# Patient Record
Sex: Female | Born: 1945 | Race: Black or African American | Hispanic: No | Marital: Single | Smoking: Former smoker
Health system: Southern US, Community
[De-identification: ages and names within clinical notes are randomized; demographics above are authoritative.]

## PROBLEM LIST (undated history)

## (undated) DIAGNOSIS — G4733 Obstructive sleep apnea (adult) (pediatric): Secondary | ICD-10-CM

## (undated) DIAGNOSIS — F259 Schizoaffective disorder, unspecified: Secondary | ICD-10-CM

## (undated) DIAGNOSIS — E662 Morbid (severe) obesity with alveolar hypoventilation: Secondary | ICD-10-CM

## (undated) DIAGNOSIS — I10 Essential (primary) hypertension: Secondary | ICD-10-CM

## (undated) DIAGNOSIS — K7689 Other specified diseases of liver: Secondary | ICD-10-CM

## (undated) DIAGNOSIS — J449 Chronic obstructive pulmonary disease, unspecified: Secondary | ICD-10-CM

## (undated) DIAGNOSIS — K219 Gastro-esophageal reflux disease without esophagitis: Secondary | ICD-10-CM

## (undated) DIAGNOSIS — E785 Hyperlipidemia, unspecified: Secondary | ICD-10-CM

## (undated) DIAGNOSIS — D1771 Benign lipomatous neoplasm of kidney: Secondary | ICD-10-CM

## (undated) DIAGNOSIS — J9611 Chronic respiratory failure with hypoxia: Secondary | ICD-10-CM

---

## 2014-04-07 ENCOUNTER — Other Ambulatory Visit (HOSPITAL_COMMUNITY): Payer: Medicare Other

## 2014-04-07 ENCOUNTER — Inpatient Hospital Stay
Admission: EM | Admit: 2014-04-07 | Discharge: 2014-04-26 | Disposition: A | Discharge status: Home Health | Payer: Medicare Other | Source: Other Acute Inpatient Hospital | Attending: Internal Medicine | Admitting: Internal Medicine

## 2014-04-07 LAB — BLOOD GAS, ARTERIAL
ACID-BASE EXCESS: 11.4 mmol/L — AB (ref 0.0–2.0)
BICARBONATE: 37.1 meq/L — AB (ref 20.0–24.0)
Delivery systems: POSITIVE
Expiratory PAP: 6
FIO2: 0.4 %
INSPIRATORY PAP: 18
O2 SAT: 96.4 %
PATIENT TEMPERATURE: 98.6
PO2 ART: 84.2 mmHg (ref 80.0–100.0)
TCO2: 39.1 mmol/L (ref 0–100)
pCO2 arterial: 65.9 mmHg (ref 35.0–45.0)
pH, Arterial: 7.369 (ref 7.350–7.450)

## 2014-04-08 LAB — COMPREHENSIVE METABOLIC PANEL
ALT: 30 U/L (ref 0–35)
AST: 12 U/L (ref 0–37)
Albumin: 2.8 g/dL — ABNORMAL LOW (ref 3.5–5.2)
Alkaline Phosphatase: 52 U/L (ref 39–117)
BUN: 21 mg/dL (ref 6–23)
CALCIUM: 8.7 mg/dL (ref 8.4–10.5)
CO2: 32 mEq/L (ref 19–32)
CREATININE: 0.47 mg/dL — AB (ref 0.50–1.10)
Chloride: 99 mEq/L (ref 96–112)
GFR calc non Af Amer: >90 mL/min (ref >90)
GLUCOSE: 174 mg/dL — AB (ref 70–99)
Potassium: 4 mEq/L (ref 3.7–5.3)
Sodium: 141 mEq/L (ref 137–147)
TOTAL PROTEIN: 6.1 g/dL (ref 6.0–8.3)
Total Bilirubin: <0.2 mg/dL — ABNORMAL LOW (ref 0.3–1.2)

## 2014-04-08 LAB — BLOOD GAS, ARTERIAL
Acid-Base Excess: 10.9 mmol/L — ABNORMAL HIGH (ref 0.0–2.0)
Acid-Base Excess: 8.4 mmol/L — ABNORMAL HIGH (ref 0.0–2.0)
BICARBONATE: 36.3 meq/L — AB (ref 20.0–24.0)
BICARBONATE: 33.7 meq/L — AB (ref 20.0–24.0)
DRAWN BY: 252031
O2 CONTENT: 4 L/min
O2 CONTENT: 4 L/min
O2 SAT: 93.4 %
O2 Saturation: 92.2 %
PATIENT TEMPERATURE: 98.6
PCO2 ART: 62.8 mmHg — AB (ref 35.0–45.0)
PH ART: 7.381 (ref 7.350–7.450)
PH ART: 7.373 (ref 7.350–7.450)
PO2 ART: 67.6 mmHg — AB (ref 80.0–100.0)
Patient temperature: 98.6
TCO2: 38.3 mmol/L (ref 0–100)
TCO2: 35.5 mmol/L (ref 0–100)
pCO2 arterial: 59.4 mmHg (ref 35.0–45.0)
pO2, Arterial: 72 mmHg — ABNORMAL LOW (ref 80.0–100.0)

## 2014-04-08 LAB — CBC WITH DIFFERENTIAL/PLATELET
Basophils Absolute: 0 10*3/uL (ref 0.0–0.1)
Basophils Relative: 0 % (ref 0–1)
EOS ABS: 0 10*3/uL (ref 0.0–0.7)
EOS PCT: 0 % (ref 0–5)
HCT: 34.2 % — ABNORMAL LOW (ref 36.0–46.0)
HEMOGLOBIN: 10 g/dL — AB (ref 12.0–15.0)
Lymphocytes Relative: 8 % — ABNORMAL LOW (ref 12–46)
Lymphs Abs: 0.5 10*3/uL — ABNORMAL LOW (ref 0.7–4.0)
MCH: 23.6 pg — AB (ref 26.0–34.0)
MCHC: 29.2 g/dL — ABNORMAL LOW (ref 30.0–36.0)
MCV: 80.9 fL (ref 78.0–100.0)
MONOS PCT: 6 % (ref 3–12)
Monocytes Absolute: 0.4 10*3/uL (ref 0.1–1.0)
Neutro Abs: 5.5 10*3/uL (ref 1.7–7.7)
Neutrophils Relative %: 86 % — ABNORMAL HIGH (ref 43–77)
Platelets: 178 10*3/uL (ref 150–400)
RBC: 4.23 MIL/uL (ref 3.87–5.11)
RDW: 14.8 % (ref 11.5–15.5)
WBC: 6.3 10*3/uL (ref 4.0–10.5)

## 2014-04-08 LAB — PHOSPHORUS: PHOSPHORUS: 2.4 mg/dL (ref 2.3–4.6)

## 2014-04-08 LAB — TSH: TSH: 0.676 u[IU]/mL (ref 0.350–4.500)

## 2014-04-08 LAB — PRO B NATRIURETIC PEPTIDE: Pro B Natriuretic peptide (BNP): 35.9 pg/mL (ref 0–125)

## 2014-04-08 LAB — PROCALCITONIN: Procalcitonin: <0.1 ng/mL

## 2014-04-08 LAB — MAGNESIUM: MAGNESIUM: 2.1 mg/dL (ref 1.5–2.5)

## 2014-04-09 LAB — IRON AND TIBC
IRON: 60 ug/dL (ref 42–135)
Saturation Ratios: 20 % (ref 20–55)
TIBC: 296 ug/dL (ref 250–470)
UIBC: 236 ug/dL (ref 125–400)

## 2014-04-09 LAB — T4, FREE: Free T4: 1.34 ng/dL (ref 0.80–1.80)

## 2014-04-09 LAB — VITAMIN B12: Vitamin B-12: 643 pg/mL (ref 211–911)

## 2014-04-09 LAB — FOLATE RBC: RBC Folate: 965 ng/mL — ABNORMAL HIGH (ref >280)

## 2014-04-09 LAB — PREALBUMIN: PREALBUMIN: 26.3 mg/dL (ref 17.0–34.0)

## 2014-04-11 ENCOUNTER — Other Ambulatory Visit (HOSPITAL_COMMUNITY): Payer: Medicare Other

## 2014-04-11 LAB — BASIC METABOLIC PANEL
BUN: 20 mg/dL (ref 6–23)
CALCIUM: 8.8 mg/dL (ref 8.4–10.5)
CO2: 38 mEq/L — ABNORMAL HIGH (ref 19–32)
Chloride: 101 mEq/L (ref 96–112)
Creatinine, Ser: 0.46 mg/dL — ABNORMAL LOW (ref 0.50–1.10)
GFR calc Af Amer: >90 mL/min (ref >90)
Glucose, Bld: 148 mg/dL — ABNORMAL HIGH (ref 70–99)
Potassium: 4.8 mEq/L (ref 3.7–5.3)
Sodium: 145 mEq/L (ref 137–147)

## 2014-04-11 LAB — CBC
HCT: 37.7 % (ref 36.0–46.0)
Hemoglobin: 10.9 g/dL — ABNORMAL LOW (ref 12.0–15.0)
MCH: 24 pg — AB (ref 26.0–34.0)
MCHC: 28.9 g/dL — AB (ref 30.0–36.0)
MCV: 82.9 fL (ref 78.0–100.0)
PLATELETS: 187 10*3/uL (ref 150–400)
RBC: 4.55 MIL/uL (ref 3.87–5.11)
RDW: 15.1 % (ref 11.5–15.5)
WBC: 9.6 10*3/uL (ref 4.0–10.5)

## 2014-04-12 ENCOUNTER — Other Ambulatory Visit (HOSPITAL_COMMUNITY): Payer: Medicare Other

## 2014-04-13 ENCOUNTER — Other Ambulatory Visit (HOSPITAL_COMMUNITY): Payer: Medicare Other

## 2014-04-13 LAB — BASIC METABOLIC PANEL
BUN: 15 mg/dL (ref 6–23)
CALCIUM: 9.1 mg/dL (ref 8.4–10.5)
CO2: 39 mEq/L — ABNORMAL HIGH (ref 19–32)
CREATININE: 0.36 mg/dL — AB (ref 0.50–1.10)
Chloride: 95 mEq/L — ABNORMAL LOW (ref 96–112)
GFR calc Af Amer: >90 mL/min (ref >90)
GFR calc non Af Amer: >90 mL/min (ref >90)
Glucose, Bld: 195 mg/dL — ABNORMAL HIGH (ref 70–99)
Potassium: 4.7 mEq/L (ref 3.7–5.3)
SODIUM: 141 meq/L (ref 137–147)

## 2014-04-13 LAB — CBC
HCT: 36.7 % (ref 36.0–46.0)
Hemoglobin: 10.7 g/dL — ABNORMAL LOW (ref 12.0–15.0)
MCH: 24 pg — ABNORMAL LOW (ref 26.0–34.0)
MCHC: 29.2 g/dL — ABNORMAL LOW (ref 30.0–36.0)
MCV: 82.5 fL (ref 78.0–100.0)
PLATELETS: 167 10*3/uL (ref 150–400)
RBC: 4.45 MIL/uL (ref 3.87–5.11)
RDW: 15.2 % (ref 11.5–15.5)
WBC: 8.4 10*3/uL (ref 4.0–10.5)

## 2014-04-15 ENCOUNTER — Other Ambulatory Visit (HOSPITAL_COMMUNITY): Payer: Medicare Other

## 2014-04-15 LAB — CBC WITH DIFFERENTIAL/PLATELET
BASOS ABS: 0 10*3/uL (ref 0.0–0.1)
Basophils Relative: 0 % (ref 0–1)
Eosinophils Absolute: 0 10*3/uL (ref 0.0–0.7)
Eosinophils Relative: 0 % (ref 0–5)
HCT: 34.4 % — ABNORMAL LOW (ref 36.0–46.0)
Hemoglobin: 9.9 g/dL — ABNORMAL LOW (ref 12.0–15.0)
LYMPHS ABS: 0.4 10*3/uL — AB (ref 0.7–4.0)
LYMPHS PCT: 6 % — AB (ref 12–46)
MCH: 23.9 pg — ABNORMAL LOW (ref 26.0–34.0)
MCHC: 28.8 g/dL — ABNORMAL LOW (ref 30.0–36.0)
MCV: 83.1 fL (ref 78.0–100.0)
Monocytes Absolute: 0.4 10*3/uL (ref 0.1–1.0)
Monocytes Relative: 5 % (ref 3–12)
NEUTROS ABS: 6.3 10*3/uL (ref 1.7–7.7)
NEUTROS PCT: 89 % — AB (ref 43–77)
PLATELETS: 183 10*3/uL (ref 150–400)
RBC: 4.14 MIL/uL (ref 3.87–5.11)
RDW: 15.5 % (ref 11.5–15.5)
WBC: 7.1 10*3/uL (ref 4.0–10.5)

## 2014-04-15 LAB — BASIC METABOLIC PANEL
BUN: 17 mg/dL (ref 6–23)
CHLORIDE: 96 meq/L (ref 96–112)
CO2: 41 meq/L — AB (ref 19–32)
Calcium: 9 mg/dL (ref 8.4–10.5)
Creatinine, Ser: 0.4 mg/dL — ABNORMAL LOW (ref 0.50–1.10)
GFR calc Af Amer: >90 mL/min (ref >90)
GFR calc non Af Amer: >90 mL/min (ref >90)
GLUCOSE: 208 mg/dL — AB (ref 70–99)
POTASSIUM: 4.9 meq/L (ref 3.7–5.3)
SODIUM: 144 meq/L (ref 137–147)

## 2014-04-16 LAB — CBC WITH DIFFERENTIAL/PLATELET
Basophils Absolute: 0 10*3/uL (ref 0.0–0.1)
Basophils Relative: 0 % (ref 0–1)
EOS PCT: 1 % (ref 0–5)
Eosinophils Absolute: 0.1 10*3/uL (ref 0.0–0.7)
HCT: 34.5 % — ABNORMAL LOW (ref 36.0–46.0)
HEMOGLOBIN: 9.9 g/dL — AB (ref 12.0–15.0)
LYMPHS ABS: 1.2 10*3/uL (ref 0.7–4.0)
LYMPHS PCT: 20 % (ref 12–46)
MCH: 23.9 pg — AB (ref 26.0–34.0)
MCHC: 28.7 g/dL — ABNORMAL LOW (ref 30.0–36.0)
MCV: 83.3 fL (ref 78.0–100.0)
Monocytes Absolute: 0.6 10*3/uL (ref 0.1–1.0)
Monocytes Relative: 11 % (ref 3–12)
Neutro Abs: 4 10*3/uL (ref 1.7–7.7)
Neutrophils Relative %: 68 % (ref 43–77)
PLATELETS: 133 10*3/uL — AB (ref 150–400)
RBC: 4.14 MIL/uL (ref 3.87–5.11)
RDW: 15.7 % — ABNORMAL HIGH (ref 11.5–15.5)
WBC: 5.9 10*3/uL (ref 4.0–10.5)

## 2014-04-16 LAB — MAGNESIUM: Magnesium: 2 mg/dL (ref 1.5–2.5)

## 2014-04-16 LAB — BASIC METABOLIC PANEL
BUN: 14 mg/dL (ref 6–23)
CO2: 42 meq/L — AB (ref 19–32)
Calcium: 9.1 mg/dL (ref 8.4–10.5)
Chloride: 96 mEq/L (ref 96–112)
Creatinine, Ser: 0.54 mg/dL (ref 0.50–1.10)
GFR calc Af Amer: >90 mL/min (ref >90)
GFR calc non Af Amer: >90 mL/min (ref >90)
Glucose, Bld: 137 mg/dL — ABNORMAL HIGH (ref 70–99)
POTASSIUM: 4.2 meq/L (ref 3.7–5.3)
SODIUM: 144 meq/L (ref 137–147)

## 2014-04-16 LAB — PREALBUMIN: PREALBUMIN: 26 mg/dL (ref 17.0–34.0)

## 2014-04-16 LAB — PHOSPHORUS: PHOSPHORUS: 4.1 mg/dL (ref 2.3–4.6)

## 2014-04-17 ENCOUNTER — Other Ambulatory Visit (HOSPITAL_COMMUNITY): Payer: Medicare Other

## 2014-04-17 LAB — CBC WITH DIFFERENTIAL/PLATELET
Basophils Absolute: 0 10*3/uL (ref 0.0–0.1)
Basophils Relative: 0 % (ref 0–1)
Eosinophils Absolute: 0.1 10*3/uL (ref 0.0–0.7)
Eosinophils Relative: 2 % (ref 0–5)
HCT: 35.2 % — ABNORMAL LOW (ref 36.0–46.0)
HEMOGLOBIN: 10 g/dL — AB (ref 12.0–15.0)
LYMPHS PCT: 20 % (ref 12–46)
Lymphs Abs: 1.2 10*3/uL (ref 0.7–4.0)
MCH: 23.6 pg — ABNORMAL LOW (ref 26.0–34.0)
MCHC: 28.4 g/dL — ABNORMAL LOW (ref 30.0–36.0)
MCV: 83 fL (ref 78.0–100.0)
MONOS PCT: 10 % (ref 3–12)
Monocytes Absolute: 0.6 10*3/uL (ref 0.1–1.0)
NEUTROS ABS: 4 10*3/uL (ref 1.7–7.7)
NEUTROS PCT: 68 % (ref 43–77)
Platelets: 159 10*3/uL (ref 150–400)
RBC: 4.24 MIL/uL (ref 3.87–5.11)
RDW: 15.8 % — ABNORMAL HIGH (ref 11.5–15.5)
WBC: 5.9 10*3/uL (ref 4.0–10.5)

## 2014-04-17 LAB — BASIC METABOLIC PANEL
BUN: 15 mg/dL (ref 6–23)
CHLORIDE: 96 meq/L (ref 96–112)
CO2: 41 meq/L — AB (ref 19–32)
Calcium: 9.5 mg/dL (ref 8.4–10.5)
Creatinine, Ser: 0.51 mg/dL (ref 0.50–1.10)
GFR calc Af Amer: >90 mL/min (ref >90)
GFR calc non Af Amer: >90 mL/min (ref >90)
GLUCOSE: 129 mg/dL — AB (ref 70–99)
POTASSIUM: 4.4 meq/L (ref 3.7–5.3)
Sodium: 145 mEq/L (ref 137–147)

## 2014-04-19 ENCOUNTER — Other Ambulatory Visit (HOSPITAL_COMMUNITY): Payer: Medicare Other

## 2014-04-19 LAB — CBC
HEMATOCRIT: 33.6 % — AB (ref 36.0–46.0)
HEMOGLOBIN: 9.5 g/dL — AB (ref 12.0–15.0)
MCH: 23.8 pg — AB (ref 26.0–34.0)
MCHC: 28.3 g/dL — AB (ref 30.0–36.0)
MCV: 84 fL (ref 78.0–100.0)
Platelets: 127 10*3/uL — ABNORMAL LOW (ref 150–400)
RBC: 4 MIL/uL (ref 3.87–5.11)
RDW: 15.5 % (ref 11.5–15.5)
WBC: 5.6 10*3/uL (ref 4.0–10.5)

## 2014-04-19 LAB — BASIC METABOLIC PANEL
BUN: 16 mg/dL (ref 6–23)
CO2: 43 mEq/L (ref 19–32)
Calcium: 9.4 mg/dL (ref 8.4–10.5)
Chloride: 95 mEq/L — ABNORMAL LOW (ref 96–112)
Creatinine, Ser: 0.5 mg/dL (ref 0.50–1.10)
GFR calc Af Amer: >90 mL/min (ref >90)
GLUCOSE: 112 mg/dL — AB (ref 70–99)
Potassium: 3.8 mEq/L (ref 3.7–5.3)
Sodium: 144 mEq/L (ref 137–147)

## 2014-04-21 LAB — BASIC METABOLIC PANEL
BUN: 14 mg/dL (ref 6–23)
CALCIUM: 9.3 mg/dL (ref 8.4–10.5)
CO2: 39 mEq/L — ABNORMAL HIGH (ref 19–32)
Chloride: 98 mEq/L (ref 96–112)
Creatinine, Ser: 0.53 mg/dL (ref 0.50–1.10)
GFR calc Af Amer: >90 mL/min (ref >90)
GFR calc non Af Amer: >90 mL/min (ref >90)
GLUCOSE: 112 mg/dL — AB (ref 70–99)
Potassium: 4 mEq/L (ref 3.7–5.3)
Sodium: 144 mEq/L (ref 137–147)

## 2014-04-21 LAB — CBC
HCT: 33.1 % — ABNORMAL LOW (ref 36.0–46.0)
HEMOGLOBIN: 9.5 g/dL — AB (ref 12.0–15.0)
MCH: 24 pg — AB (ref 26.0–34.0)
MCHC: 28.7 g/dL — AB (ref 30.0–36.0)
MCV: 83.6 fL (ref 78.0–100.0)
Platelets: 145 10*3/uL — ABNORMAL LOW (ref 150–400)
RBC: 3.96 MIL/uL (ref 3.87–5.11)
RDW: 16 % — ABNORMAL HIGH (ref 11.5–15.5)
WBC: 5.2 10*3/uL (ref 4.0–10.5)

## 2014-04-23 LAB — COMPREHENSIVE METABOLIC PANEL
ALBUMIN: 3.3 g/dL — AB (ref 3.5–5.2)
ALT: 54 U/L — ABNORMAL HIGH (ref 0–35)
AST: 23 U/L (ref 0–37)
Alkaline Phosphatase: 55 U/L (ref 39–117)
BUN: 20 mg/dL (ref 6–23)
CO2: 38 mEq/L — ABNORMAL HIGH (ref 19–32)
CREATININE: 0.51 mg/dL (ref 0.50–1.10)
Calcium: 9.3 mg/dL (ref 8.4–10.5)
Chloride: 97 mEq/L (ref 96–112)
GFR calc Af Amer: >90 mL/min (ref >90)
GFR calc non Af Amer: >90 mL/min (ref >90)
Glucose, Bld: 149 mg/dL — ABNORMAL HIGH (ref 70–99)
Potassium: 3.8 mEq/L (ref 3.7–5.3)
Sodium: 143 mEq/L (ref 137–147)
Total Bilirubin: 0.2 mg/dL — ABNORMAL LOW (ref 0.3–1.2)
Total Protein: 6.4 g/dL (ref 6.0–8.3)

## 2014-04-23 LAB — PREALBUMIN: Prealbumin: 24.5 mg/dL (ref 17.0–34.0)

## 2014-04-23 LAB — CBC
HCT: 34 % — ABNORMAL LOW (ref 36.0–46.0)
Hemoglobin: 9.7 g/dL — ABNORMAL LOW (ref 12.0–15.0)
MCH: 24.2 pg — AB (ref 26.0–34.0)
MCHC: 28.5 g/dL — ABNORMAL LOW (ref 30.0–36.0)
MCV: 84.8 fL (ref 78.0–100.0)
Platelets: 123 10*3/uL — ABNORMAL LOW (ref 150–400)
RBC: 4.01 MIL/uL (ref 3.87–5.11)
RDW: 16 % — ABNORMAL HIGH (ref 11.5–15.5)
WBC: 5.8 10*3/uL (ref 4.0–10.5)

## 2014-09-21 ENCOUNTER — Other Ambulatory Visit (HOSPITAL_COMMUNITY): Payer: Self-pay

## 2014-09-21 ENCOUNTER — Inpatient Hospital Stay
Admission: AD | Admit: 2014-09-21 | Discharge: 2014-09-22 | Disposition: A | Discharge status: Other Acute Inpatient Hospital | Payer: Self-pay | Source: Ambulatory Visit | Attending: Family Medicine | Admitting: Family Medicine

## 2014-09-21 DIAGNOSIS — T884XXA Failed or difficult intubation, initial encounter: Secondary | ICD-10-CM

## 2014-09-21 DIAGNOSIS — J9690 Respiratory failure, unspecified, unspecified whether with hypoxia or hypercapnia: Secondary | ICD-10-CM

## 2014-09-21 DIAGNOSIS — J189 Pneumonia, unspecified organism: Secondary | ICD-10-CM

## 2014-09-21 LAB — COMPREHENSIVE METABOLIC PANEL
ALBUMIN: 3.1 g/dL — AB (ref 3.5–5.2)
ALT: 12 U/L (ref 0–35)
AST: 11 U/L (ref 0–37)
Alkaline Phosphatase: 50 U/L (ref 39–117)
Anion gap: 8 (ref 5–15)
BUN: 15 mg/dL (ref 6–23)
CALCIUM: 9.1 mg/dL (ref 8.4–10.5)
CO2: 37 mEq/L — ABNORMAL HIGH (ref 19–32)
Chloride: 99 mEq/L (ref 96–112)
Creatinine, Ser: 0.48 mg/dL — ABNORMAL LOW (ref 0.50–1.10)
GFR calc Af Amer: >90 mL/min (ref >90)
GFR calc non Af Amer: >90 mL/min (ref >90)
Glucose, Bld: 180 mg/dL — ABNORMAL HIGH (ref 70–99)
Potassium: 4.2 mEq/L (ref 3.7–5.3)
SODIUM: 144 meq/L (ref 137–147)
TOTAL PROTEIN: 6.2 g/dL (ref 6.0–8.3)
Total Bilirubin: <0.2 mg/dL — ABNORMAL LOW (ref 0.3–1.2)

## 2014-09-21 LAB — TSH: TSH: 0.594 u[IU]/mL (ref 0.350–4.500)

## 2014-09-21 LAB — BLOOD GAS, ARTERIAL
Acid-Base Excess: 10.6 mmol/L — ABNORMAL HIGH (ref 0.0–2.0)
Bicarbonate: 36.5 mEq/L — ABNORMAL HIGH (ref 20.0–24.0)
O2 CONTENT: 2.5 L/min
O2 Saturation: 96.6 %
PH ART: 7.338 — AB (ref 7.350–7.450)
PO2 ART: 87.7 mmHg (ref 80.0–100.0)
Patient temperature: 98.6
TCO2: 38.7 mmol/L (ref 0–100)
pCO2 arterial: 69.9 mmHg (ref 35.0–45.0)

## 2014-09-21 LAB — LACTIC ACID, PLASMA: Lactic Acid, Venous: 0.5 mmol/L (ref 0.5–2.2)

## 2014-09-22 ENCOUNTER — Inpatient Hospital Stay (HOSPITAL_COMMUNITY): Payer: Medicare Other

## 2014-09-22 ENCOUNTER — Other Ambulatory Visit (HOSPITAL_COMMUNITY): Payer: Self-pay

## 2014-09-22 ENCOUNTER — Inpatient Hospital Stay (HOSPITAL_COMMUNITY)
Admission: AD | Admit: 2014-09-22 | Discharge: 2014-10-11 | DRG: 004 | Disposition: A | Discharge status: Other Acute Inpatient Hospital | Payer: Medicare Other | Source: Ambulatory Visit | Attending: Internal Medicine | Admitting: Internal Medicine

## 2014-09-22 ENCOUNTER — Encounter: Payer: Self-pay | Admitting: Anesthesiology

## 2014-09-22 ENCOUNTER — Encounter (HOSPITAL_COMMUNITY): Payer: Self-pay | Admitting: Emergency Medicine

## 2014-09-22 ENCOUNTER — Other Ambulatory Visit (HOSPITAL_COMMUNITY): Payer: Medicare Other

## 2014-09-22 DIAGNOSIS — J90 Pleural effusion, not elsewhere classified: Secondary | ICD-10-CM | POA: Diagnosis present

## 2014-09-22 DIAGNOSIS — E8770 Fluid overload, unspecified: Secondary | ICD-10-CM | POA: Diagnosis not present

## 2014-09-22 DIAGNOSIS — Y95 Nosocomial condition: Secondary | ICD-10-CM | POA: Diagnosis present

## 2014-09-22 DIAGNOSIS — Z9981 Dependence on supplemental oxygen: Secondary | ICD-10-CM

## 2014-09-22 DIAGNOSIS — I10 Essential (primary) hypertension: Secondary | ICD-10-CM | POA: Diagnosis present

## 2014-09-22 DIAGNOSIS — E870 Hyperosmolality and hypernatremia: Secondary | ICD-10-CM | POA: Diagnosis not present

## 2014-09-22 DIAGNOSIS — Z8673 Personal history of transient ischemic attack (TIA), and cerebral infarction without residual deficits: Secondary | ICD-10-CM

## 2014-09-22 DIAGNOSIS — G9340 Encephalopathy, unspecified: Secondary | ICD-10-CM | POA: Diagnosis present

## 2014-09-22 DIAGNOSIS — J9621 Acute and chronic respiratory failure with hypoxia: Secondary | ICD-10-CM | POA: Diagnosis present

## 2014-09-22 DIAGNOSIS — K7689 Other specified diseases of liver: Secondary | ICD-10-CM | POA: Diagnosis present

## 2014-09-22 DIAGNOSIS — Z880 Allergy status to penicillin: Secondary | ICD-10-CM | POA: Diagnosis not present

## 2014-09-22 DIAGNOSIS — E785 Hyperlipidemia, unspecified: Secondary | ICD-10-CM | POA: Diagnosis present

## 2014-09-22 DIAGNOSIS — Z87891 Personal history of nicotine dependence: Secondary | ICD-10-CM

## 2014-09-22 DIAGNOSIS — Z978 Presence of other specified devices: Secondary | ICD-10-CM

## 2014-09-22 DIAGNOSIS — B952 Enterococcus as the cause of diseases classified elsewhere: Secondary | ICD-10-CM | POA: Diagnosis not present

## 2014-09-22 DIAGNOSIS — G4733 Obstructive sleep apnea (adult) (pediatric): Secondary | ICD-10-CM | POA: Diagnosis present

## 2014-09-22 DIAGNOSIS — D649 Anemia, unspecified: Secondary | ICD-10-CM | POA: Diagnosis present

## 2014-09-22 DIAGNOSIS — J9690 Respiratory failure, unspecified, unspecified whether with hypoxia or hypercapnia: Secondary | ICD-10-CM

## 2014-09-22 DIAGNOSIS — Z6832 Body mass index (BMI) 32.0-32.9, adult: Secondary | ICD-10-CM | POA: Diagnosis not present

## 2014-09-22 DIAGNOSIS — K219 Gastro-esophageal reflux disease without esophagitis: Secondary | ICD-10-CM | POA: Diagnosis present

## 2014-09-22 DIAGNOSIS — R059 Cough, unspecified: Secondary | ICD-10-CM

## 2014-09-22 DIAGNOSIS — G9349 Other encephalopathy: Secondary | ICD-10-CM | POA: Diagnosis present

## 2014-09-22 DIAGNOSIS — R0602 Shortness of breath: Secondary | ICD-10-CM | POA: Diagnosis present

## 2014-09-22 DIAGNOSIS — N179 Acute kidney failure, unspecified: Secondary | ICD-10-CM | POA: Diagnosis not present

## 2014-09-22 DIAGNOSIS — E876 Hypokalemia: Secondary | ICD-10-CM | POA: Diagnosis present

## 2014-09-22 DIAGNOSIS — J9601 Acute respiratory failure with hypoxia: Secondary | ICD-10-CM

## 2014-09-22 DIAGNOSIS — Z9289 Personal history of other medical treatment: Secondary | ICD-10-CM

## 2014-09-22 DIAGNOSIS — R29898 Other symptoms and signs involving the musculoskeletal system: Secondary | ICD-10-CM

## 2014-09-22 DIAGNOSIS — Z931 Gastrostomy status: Secondary | ICD-10-CM

## 2014-09-22 DIAGNOSIS — R579 Shock, unspecified: Secondary | ICD-10-CM | POA: Diagnosis present

## 2014-09-22 DIAGNOSIS — J9602 Acute respiratory failure with hypercapnia: Secondary | ICD-10-CM

## 2014-09-22 DIAGNOSIS — R739 Hyperglycemia, unspecified: Secondary | ICD-10-CM | POA: Diagnosis present

## 2014-09-22 DIAGNOSIS — R05 Cough: Secondary | ICD-10-CM

## 2014-09-22 DIAGNOSIS — F259 Schizoaffective disorder, unspecified: Secondary | ICD-10-CM | POA: Diagnosis present

## 2014-09-22 DIAGNOSIS — R1310 Dysphagia, unspecified: Secondary | ICD-10-CM | POA: Diagnosis not present

## 2014-09-22 DIAGNOSIS — I468 Cardiac arrest due to other underlying condition: Secondary | ICD-10-CM | POA: Diagnosis present

## 2014-09-22 DIAGNOSIS — G931 Anoxic brain damage, not elsewhere classified: Secondary | ICD-10-CM

## 2014-09-22 DIAGNOSIS — J189 Pneumonia, unspecified organism: Secondary | ICD-10-CM | POA: Diagnosis present

## 2014-09-22 DIAGNOSIS — J449 Chronic obstructive pulmonary disease, unspecified: Secondary | ICD-10-CM | POA: Diagnosis present

## 2014-09-22 DIAGNOSIS — N390 Urinary tract infection, site not specified: Secondary | ICD-10-CM | POA: Diagnosis not present

## 2014-09-22 DIAGNOSIS — E662 Morbid (severe) obesity with alveolar hypoventilation: Secondary | ICD-10-CM | POA: Diagnosis present

## 2014-09-22 DIAGNOSIS — J440 Chronic obstructive pulmonary disease with acute lower respiratory infection: Secondary | ICD-10-CM | POA: Insufficient documentation

## 2014-09-22 DIAGNOSIS — R410 Disorientation, unspecified: Secondary | ICD-10-CM

## 2014-09-22 DIAGNOSIS — I469 Cardiac arrest, cause unspecified: Secondary | ICD-10-CM

## 2014-09-22 DIAGNOSIS — J9600 Acute respiratory failure, unspecified whether with hypoxia or hypercapnia: Secondary | ICD-10-CM | POA: Insufficient documentation

## 2014-09-22 DIAGNOSIS — Z9889 Other specified postprocedural states: Secondary | ICD-10-CM

## 2014-09-22 HISTORY — DX: Morbid (severe) obesity with alveolar hypoventilation: E66.2

## 2014-09-22 HISTORY — DX: Gastro-esophageal reflux disease without esophagitis: K21.9

## 2014-09-22 HISTORY — DX: Schizoaffective disorder, unspecified: F25.9

## 2014-09-22 HISTORY — DX: Essential (primary) hypertension: I10

## 2014-09-22 HISTORY — DX: Hyperlipidemia, unspecified: E78.5

## 2014-09-22 HISTORY — DX: Chronic respiratory failure with hypoxia: J96.11

## 2014-09-22 HISTORY — DX: Benign lipomatous neoplasm of kidney: D17.71

## 2014-09-22 HISTORY — DX: Chronic obstructive pulmonary disease, unspecified: J44.9

## 2014-09-22 HISTORY — DX: Other specified diseases of liver: K76.89

## 2014-09-22 HISTORY — DX: Obstructive sleep apnea (adult) (pediatric): G47.33

## 2014-09-22 LAB — POCT I-STAT 3, ART BLOOD GAS (G3+)
ACID-BASE EXCESS: 10 mmol/L — AB (ref 0.0–2.0)
Acid-Base Excess: 8 mmol/L — ABNORMAL HIGH (ref 0.0–2.0)
Bicarbonate: 39.1 mEq/L — ABNORMAL HIGH (ref 20.0–24.0)
Bicarbonate: 33.5 mEq/L — ABNORMAL HIGH (ref 20.0–24.0)
O2 SAT: 100 %
O2 Saturation: 99 %
PH ART: 7.422 (ref 7.350–7.450)
Patient temperature: 98.6
TCO2: 42 mmol/L (ref 0–100)
TCO2: 35 mmol/L (ref 0–100)
pCO2 arterial: 83.9 mmHg (ref 35.0–45.0)
pCO2 arterial: 51.5 mmHg — ABNORMAL HIGH (ref 35.0–45.0)
pH, Arterial: 7.277 — ABNORMAL LOW (ref 7.350–7.450)
pO2, Arterial: 347 mmHg — ABNORMAL HIGH (ref 80.0–100.0)
pO2, Arterial: 121 mmHg — ABNORMAL HIGH (ref 80.0–100.0)

## 2014-09-22 LAB — BLOOD GAS, ARTERIAL
ACID-BASE EXCESS: 9.9 mmol/L — AB (ref 0.0–2.0)
Acid-Base Excess: 10.9 mmol/L — ABNORMAL HIGH (ref 0.0–2.0)
Bicarbonate: 37.4 mEq/L — ABNORMAL HIGH (ref 20.0–24.0)
Bicarbonate: 38.4 mEq/L — ABNORMAL HIGH (ref 20.0–24.0)
DELIVERY SYSTEMS: POSITIVE
Delivery systems: POSITIVE
EXPIRATORY PAP: 6
Expiratory PAP: 6
FIO2: 0.4 %
FIO2: 0.4 %
INSPIRATORY PAP: 22
Inspiratory PAP: 16
O2 SAT: 98.3 %
O2 Saturation: 97.6 %
PATIENT TEMPERATURE: 98.6
PCO2 ART: 93.7 mmHg — AB (ref 35.0–45.0)
Patient temperature: 98.6
TCO2: 40.3 mmol/L (ref 0–100)
TCO2: 41.4 mmol/L (ref 0–100)
pCO2 arterial: 94.9 mmHg (ref 35.0–45.0)
pH, Arterial: 7.226 — ABNORMAL LOW (ref 7.350–7.450)
pH, Arterial: 7.231 — ABNORMAL LOW (ref 7.350–7.450)
pO2, Arterial: 115 mmHg — ABNORMAL HIGH (ref 80.0–100.0)
pO2, Arterial: 113 mmHg — ABNORMAL HIGH (ref 80.0–100.0)

## 2014-09-22 LAB — COMPREHENSIVE METABOLIC PANEL
ALK PHOS: 46 U/L (ref 39–117)
ALT: 33 U/L (ref 0–35)
ANION GAP: 9 (ref 5–15)
AST: 32 U/L (ref 0–37)
Albumin: 2.7 g/dL — ABNORMAL LOW (ref 3.5–5.2)
BILIRUBIN TOTAL: 0.2 mg/dL — AB (ref 0.3–1.2)
BUN: 19 mg/dL (ref 6–23)
CO2: 34 mEq/L — ABNORMAL HIGH (ref 19–32)
Calcium: 8.3 mg/dL — ABNORMAL LOW (ref 8.4–10.5)
Chloride: 100 mEq/L (ref 96–112)
Creatinine, Ser: 0.49 mg/dL — ABNORMAL LOW (ref 0.50–1.10)
GFR calc Af Amer: >90 mL/min (ref >90)
GFR calc non Af Amer: >90 mL/min (ref >90)
GLUCOSE: 215 mg/dL — AB (ref 70–99)
POTASSIUM: 3.7 meq/L (ref 3.7–5.3)
Sodium: 143 mEq/L (ref 137–147)
Total Protein: 5.5 g/dL — ABNORMAL LOW (ref 6.0–8.3)

## 2014-09-22 LAB — CBC WITH DIFFERENTIAL/PLATELET
Basophils Absolute: 0 10*3/uL (ref 0.0–0.1)
Basophils Relative: 0 % (ref 0–1)
EOS PCT: 0 % (ref 0–5)
Eosinophils Absolute: 0 10*3/uL (ref 0.0–0.7)
HEMATOCRIT: 30.8 % — AB (ref 36.0–46.0)
Hemoglobin: 8.7 g/dL — ABNORMAL LOW (ref 12.0–15.0)
LYMPHS ABS: 0.2 10*3/uL — AB (ref 0.7–4.0)
LYMPHS PCT: 1 % — AB (ref 12–46)
MCH: 24.3 pg — ABNORMAL LOW (ref 26.0–34.0)
MCHC: 28.2 g/dL — ABNORMAL LOW (ref 30.0–36.0)
MCV: 86 fL (ref 78.0–100.0)
MONO ABS: 0.8 10*3/uL (ref 0.1–1.0)
Monocytes Relative: 5 % (ref 3–12)
Neutro Abs: 14.4 10*3/uL — ABNORMAL HIGH (ref 1.7–7.7)
Neutrophils Relative %: 94 % — ABNORMAL HIGH (ref 43–77)
Platelets: 213 10*3/uL (ref 150–400)
RBC: 3.58 MIL/uL — AB (ref 3.87–5.11)
RDW: 14.8 % (ref 11.5–15.5)
WBC: 15.3 10*3/uL — AB (ref 4.0–10.5)

## 2014-09-22 LAB — GLUCOSE, CAPILLARY: Glucose-Capillary: 212 mg/dL — ABNORMAL HIGH (ref 70–99)

## 2014-09-22 LAB — PROTIME-INR
INR: 1.16 (ref 0.00–1.49)
Prothrombin Time: 14.9 seconds (ref 11.6–15.2)

## 2014-09-22 LAB — TROPONIN I

## 2014-09-22 LAB — LACTIC ACID, PLASMA: Lactic Acid, Venous: 1.7 mmol/L (ref 0.5–2.2)

## 2014-09-22 LAB — PHOSPHORUS: Phosphorus: 2 mg/dL — ABNORMAL LOW (ref 2.3–4.6)

## 2014-09-22 LAB — MAGNESIUM: Magnesium: 1.6 mg/dL (ref 1.5–2.5)

## 2014-09-22 MED ORDER — FENTANYL BOLUS VIA INFUSION
25.0000 ug | INTRAVENOUS | Status: DC | PRN
  Filled 2014-09-22: qty 50

## 2014-09-22 MED ORDER — IPRATROPIUM-ALBUTEROL 0.5-2.5 (3) MG/3ML IN SOLN
3.0000 mL | Freq: Four times a day (QID) | RESPIRATORY_TRACT | Status: DC
  Administered 2014-09-22 – 2014-10-01 (×36): 3 mL via RESPIRATORY_TRACT
  Filled 2014-09-22 (×35): qty 3

## 2014-09-22 MED ORDER — DOCUSATE SODIUM 50 MG/5ML PO LIQD
100.0000 mg | Freq: Two times a day (BID) | ORAL | Status: DC | PRN
  Filled 2014-09-22: qty 10

## 2014-09-22 MED ORDER — DEXTROSE 5 % IV SOLN
500.0000 mg | INTRAVENOUS | Status: DC
  Administered 2014-09-22: 500 mg via INTRAVENOUS
  Filled 2014-09-22 (×2): qty 500

## 2014-09-22 MED ORDER — FENTANYL CITRATE 0.05 MG/ML IJ SOLN: 50.0000 ug | Freq: Once | INTRAMUSCULAR | Status: DC

## 2014-09-22 MED ORDER — SODIUM CHLORIDE 0.9 % IV SOLN
INTRAVENOUS | Status: DC
  Administered 2014-09-22: 21:00:00 via INTRAVENOUS

## 2014-09-22 MED ORDER — VANCOMYCIN HCL 10 G IV SOLR
1500.0000 mg | Freq: Once | INTRAVENOUS | Status: AC
  Administered 2014-09-22: 1500 mg via INTRAVENOUS
  Filled 2014-09-22: qty 1500

## 2014-09-22 MED ORDER — HEPARIN SODIUM (PORCINE) 5000 UNIT/ML IJ SOLN
5000.0000 [IU] | Freq: Three times a day (TID) | INTRAMUSCULAR | Status: DC
  Administered 2014-09-23 – 2014-10-02 (×28): 5000 [IU] via SUBCUTANEOUS
  Filled 2014-09-22 (×33): qty 1

## 2014-09-22 MED ORDER — SODIUM CHLORIDE 0.9 % IV SOLN
0.0000 ug/h | INTRAVENOUS | Status: DC
  Administered 2014-09-23: 50 ug/h via INTRAVENOUS
  Filled 2014-09-22: qty 50

## 2014-09-22 MED ORDER — VANCOMYCIN HCL 10 G IV SOLR
1500.0000 mg | Freq: Once | INTRAVENOUS | Status: AC
  Filled 2014-09-22: qty 1500

## 2014-09-22 MED ORDER — INSULIN ASPART 100 UNIT/ML ~~LOC~~ SOLN
2.0000 [IU] | SUBCUTANEOUS | Status: DC
  Administered 2014-09-22: 6 [IU] via SUBCUTANEOUS
  Administered 2014-09-23: 2 [IU] via SUBCUTANEOUS
  Administered 2014-09-23: 6 [IU] via SUBCUTANEOUS
  Administered 2014-09-23: 2 [IU] via SUBCUTANEOUS

## 2014-09-22 MED ORDER — DEXTROSE 5 % IV SOLN
2.0000 g | Freq: Two times a day (BID) | INTRAVENOUS | Status: DC
  Filled 2014-09-22: qty 2

## 2014-09-22 MED ORDER — PANTOPRAZOLE SODIUM 40 MG IV SOLR
40.0000 mg | Freq: Every day | INTRAVENOUS | Status: DC
  Administered 2014-09-22: 40 mg via INTRAVENOUS
  Filled 2014-09-22 (×2): qty 40

## 2014-09-22 MED ORDER — IPRATROPIUM-ALBUTEROL 0.5-2.5 (3) MG/3ML IN SOLN
RESPIRATORY_TRACT | Status: AC
  Filled 2014-09-22: qty 3

## 2014-09-22 MED ORDER — VANCOMYCIN HCL IN DEXTROSE 1-5 GM/200ML-% IV SOLN
1000.0000 mg | Freq: Two times a day (BID) | INTRAVENOUS | Status: DC
  Administered 2014-09-23 – 2014-09-24 (×3): 1000 mg via INTRAVENOUS
  Filled 2014-09-22 (×4): qty 200

## 2014-09-22 MED ORDER — SODIUM CHLORIDE 0.9 % IV SOLN
250.0000 mL | INTRAVENOUS | Status: DC | PRN
  Administered 2014-09-25 – 2014-10-01 (×2): 250 mL via INTRAVENOUS

## 2014-09-22 MED ORDER — DEXTROSE 5 % IV SOLN
2.0000 g | Freq: Three times a day (TID) | INTRAVENOUS | Status: DC
  Administered 2014-09-22 – 2014-09-24 (×5): 2 g via INTRAVENOUS
  Filled 2014-09-22 (×7): qty 2

## 2014-09-22 NOTE — H&P (Signed)
PULMONARY / CRITICAL CARE MEDICINE   Name: Debbie Pena MRN: [id number] DOB: 12-03-1946    ADMISSION DATE:  09/22/2014  CHIEF COMPLAINT:  Acute on chronic respiratory failure  INITIAL PRESENTATION: Pt with COPD and OSA/OHS, [facility] to [facility] from [facility] after hospitalization for VDRF in setting RLL PNA. Developed progressive resp failure 10/17 despite BiPAP, resulted in resp arrest and PEA < 10 minutes. Pt intubated, stabilized and transferred to [facility] ICU for further care.   STUDIES:  CT chest 10/13 >> RLL infiltrate, B small effusions, L renal angiolipoma  SIGNIFICANT EVENTS: Respiratory / PEA arrest 10/17  HISTORY OF PRESENT ILLNESS:  68 yo woman with chronic resp failure due to COPD, untreated OSA/OHS, medical non-compliance. Also with HTN, schizoaffective d/o, GERD. She has been admitted twice in last several weeks for acute respiratory failure, most recently to [facility] 10/9 from SNF where she was still on pred taper and receiving rehab. She was unresponsive, require intubation at [facility]. She was treated for AE-COPD, RLL HCAP and was extubated 10/12. CT chest 10/13 showed RLL infiltrate with small effusions. She transferred to [facility] 10[facility]16 for further care. She refused BiPAP 10/16 hs and was hypercapneic am 10/17. BiPAp was placed but she did not improve. She had a resp arrest that resulted in < 10 minutes ACLS. She transfers to [facility] ICU for further care.   PAST MEDICAL HISTORY :   has a past medical history of COPD (chronic obstructive pulmonary disease); Obesity hypoventilation syndrome; OSA (obstructive sleep apnea); HTN (hypertension); Schizoaffective disorder; GERD (gastroesophageal reflux disease); Chronic respiratory failure with hypoxia; Hyperlipidemia; Renal angiolipoma; and Hepatic cyst.  has no past surgical history on file. Prior to Admission medications   Not on File   Allergies  Allergen Reactions  . Penicillins     FAMILY HISTORY:  has no  family status information on file.  SOCIAL HISTORY:  reports that she has quit smoking. She does not have any smokeless tobacco history on file. She reports that she drinks alcohol. She reports that she does not use illicit drugs.  REVIEW OF SYSTEMS:  Unable to give  SUBJECTIVE:  Unable to give  VITAL SIGNS: Pulse Rate:  [109] 109 (10/17 2000) Resp:  [12] 12 (10/17 2000) BP: (114-122)/(47-64) 114/47 mmHg (10/17 2000) SpO2:  [100 %] 100 % (10/17 2000) FiO2 (%):  [60 %] 60 % (10/17 1953) Weight:  [75.8 kg (167 lb 1.7 oz)] 75.8 kg (167 lb 1.7 oz) (10/17 1953) HEMODYNAMICS:   VENTILATOR SETTINGS: Vent Mode:  [-] PRVC FiO2 (%):  [60 %] 60 % Set Rate:  [12 bmp] 12 bmp Vt Set:  [550 mL] 550 mL PEEP:  [5 cmH20] [place] Pressure:  [32 cmH20] 32 cmH20 INTAKE / OUTPUT:  Intake/Output Summary (Last 24 hours) at 09/22/14 2056 Last data filed at 09/22/14 2000  Gross per 24 hour  Intake     30 ml  Output      0 ml  Net     30 ml    PHYSICAL EXAMINATION: General:  Obese woman, unresponsive, intubated Neuro:  Unresponsive, no spontaneous movement HEENT:  ETT in place, pupils 60mm and sluggishly react,  Cardiovascular:  Tachy, regular, no M Lungs:  Clear B, no wheezing or crackles.  Abdomen:  Obese, distended and slightly tympanitic Musculoskeletal:  No deformities, no edema Skin:  Warm, no rash  LABS:  CBC No results found for this basename: WBC, HGB, HCT, PLT,  in the last 168 hours Coag's No results  found for this basename: APTT, INR,  in the last 168 hours BMET  Recent Labs Lab 09/21/14 1632  NA 144  K 4.2  CL 99  CO2 37*  BUN 15  CREATININE 0.48*  GLUCOSE 180*   Electrolytes  Recent Labs Lab 09/21/14 1632  CALCIUM 9.1   Sepsis Markers  Recent Labs Lab 09/21/14 1330  LATICACIDVEN 0.5   ABG  Recent Labs Lab 09/22/14 1335 09/22/14 1512 09/22/14 1913  PHART 7.226* 7.231* 7.277*  PCO2ART 93.7* 94.9* 83.9*  PO2ART 115.0* 113.0* 347.0*    Liver Enzymes  Recent Labs Lab 09/21/14 1632  AST 11  ALT 12  ALKPHOS 50  BILITOT <0.2*  ALBUMIN 3.1*   Cardiac Enzymes No results found for this basename: TROPONINI, PROBNP,  in the last 168 hours Glucose No results found for this basename: GLUCAP,  in the last 168 hours  Imaging Dg Chest Port 1 View  09/21/2014   CLINICAL DATA:  Acute on chronic respiratory failure.  Pneumonia.  EXAM: PORTABLE CHEST - 1 VIEW  COMPARISON:  Apr 19, 2014.  FINDINGS: Stable cardiomegaly. Hypoinflation of the lungs is noted. Right pleural effusion is increased in size. Left pleural effusion is slightly improved, with subsegmental atelectasis seen in the left lung base. No pneumothorax is noted.  IMPRESSION: Improved left pleural effusion. Worsening right pleural effusion with probable associated atelectasis. Minimal left basilar subsegmental atelectasis is noted as well.   Electronically Signed   By: Sabino Dick M.D.   On: 09/21/2014 16:11     ASSESSMENT / PLAN:  PULMONARY OETT 10/17 >>  A: Acute on chronic resp failure Respiratory arrest COPD without current bronchospasm OSA/OHS, not on regular CPAP RLL pneumonia, HCAP vs CAP Small R pleural effusion P:   PRVC 550 x 14, 0.40 + 5 ABG in 1 hour and adjust  ETT retracted 2cm based on post-extubation CXR Scheduled BD's Abx as below   CARDIOVASCULAR CVL L femoral CVC 10/17 >>  A: PEA arrest, presumed primary respiratory arrest Shock post-CPR, improving P:  CVP now and qshift Dopa started during resuscitation, will attempt to wean to off. If unable will likely transition to norepi Would d/c her CVC on 10/18 (or ASAP) if she remains off pressors since it was inserted during code.   RENAL A:  At risk for ATN / ARF P:   BMP now, note normal S Cr on admission to [facility] 10/16  GASTROINTESTINAL A:  SUP prophylaxis Nutrition  P:   OGT to low suction PPI Nutrition consult for TF  HEMATOLOGIC A:  No acute issues DVT  prophylaxis P:  Check CBC now Heparin for DVT prophylaxis  INFECTIOUS A:  RLL CAP vs HCAP  P:   BCx2  10/17 >>   Abx:  Levaquin 10/9 >> 10/16 Vanco, start date 10/17, day 1 of 8 Cefepime, start date 10/17, day 1 of 8 Azithro, start date 10/17, day 1 of 8  She appears to have been treated with a full course of levaquin at [facility]; I will extend an abx course to cover HCAP and atypicals. May decide to stop these quickly depending on clinical course and Pct  ENDOCRINE A:  Hyperglycemia    P:   ICU SSI protocol  NEUROLOGIC A:  Encephalopathy post-arrest Sedation P:   RASS goal: -1 Will follow MS for progress next few hours off sedation. If no improvement then will check CT head. She is at risk for anoxic injury although CPR was immediate and duration was <  10 minutes.    Family updated: 10/17    Interdisciplinary Family Meeting: none yet   TODAY'S SUMMARY:  I have personally obtained a history, examined the patient, evaluated laboratory and imaging results, formulated the assessment and plan and placed orders. CRITICAL CARE: The patient is critically ill with multiple organ systems failure and requires high complexity decision making for assessment and support, frequent evaluation and titration of therapies, application of advanced monitoring technologies and extensive interpretation of multiple databases. Critical Care Time devoted to patient care services described in this note is 60 minutes.   Baltazar Apo, MD, PhD 09/22/2014, 9:01 PM [facility] [phone] or if no answer [phone]

## 2014-09-22 NOTE — Progress Notes (Addendum)
ANTIBIOTIC CONSULT NOTE - INITIAL  Pharmacy Consult for Vancomycin, Aztreonam Indication: pneumonia  Allergies  Allergen Reactions  . Penicillins     Patient Measurements: Height: 5' (152.4 cm) Weight: 167 lb 1.7 oz (75.8 kg) IBW/kg (Calculated) : 45.5 Adjusted Body Weight:   Vital Signs: Temp: 98.2 F (36.8 C) (10/17 2000) Temp Source: Oral (10/17 2000) BP: 114/47 mmHg (10/17 2000) Pulse Rate: 109 (10/17 2000) Intake/Output from previous day:   Intake/Output from this shift: Total I/O In: 30 [NG/GT:30] Out: 150 [Urine:150]  Labs:  Recent Labs  09/21/14 1632  CREATININE 0.48*   Estimated Creatinine Clearance: 60.4 ml/min (by C-G formula based on Cr of 0.48). No results found for this basename: VANCOTROUGH, VANCOPEAK, VANCORANDOM, GENTTROUGH, GENTPEAK, GENTRANDOM, TOBRATROUGH, TOBRAPEAK, TOBRARND, AMIKACINPEAK, AMIKACINTROU, AMIKACIN,  in the last 72 hours   Microbiology: No results found for this or any previous visit (from the past 720 hour(s)).  Medical History: Past Medical History  Diagnosis Date  . COPD (chronic obstructive pulmonary disease)   . Obesity hypoventilation syndrome   . OSA (obstructive sleep apnea)   . HTN (hypertension)   . Schizoaffective disorder   . GERD (gastroesophageal reflux disease)   . Chronic respiratory failure with hypoxia   . Hyperlipidemia   . Renal angiolipoma   . Hepatic cyst     Medications:  Scheduled:  . azithromycin  500 mg Intravenous Q24H  . fentaNYL  50 mcg Intravenous Once  . heparin  5,000 Units Subcutaneous 3 times per day  . insulin aspart  2-6 Units Subcutaneous 6 times per day  . ipratropium-albuterol  3 mL Nebulization Q6H  . pantoprazole (PROTONIX) IV  40 mg Intravenous QHS   Assessment: 68yo female s/p PEA, respiratory arrest at [facility], to start Vancomycin and Cefepime.  She had Vancomycin ordered this PM, but had not received a dose yet.  Cr < 1 on 10/16, labs post- arrest are  pending.  Will dose accordingly and f/u todays labs, readjusting dosing as needed.  Goal of Therapy:  Vancomycin trough level 15-20 mcg/ml  Plan:  1-  Vancomycin 1500mg  IV x 1 now, then 1000mg  IV 12 x 13 doses 2-  Aztreonam 2g IV q8 x 7 days 3-  F/U renal fxn and adjust dosing of abx if needed.  Gracy Bruins, PharmD Clinical Pharmacist [facility]

## 2014-09-22 NOTE — Progress Notes (Signed)
Chaplain Note: Responded to [employer] while patient was in 5700. Provided grief care and emotional support for patient's daughters and her sister and other family members. Follow up recommended.  Dorris Fetch, Chaplain

## 2014-09-23 ENCOUNTER — Inpatient Hospital Stay (HOSPITAL_COMMUNITY): Payer: Medicare Other

## 2014-09-23 DIAGNOSIS — J9601 Acute respiratory failure with hypoxia: Secondary | ICD-10-CM

## 2014-09-23 DIAGNOSIS — G931 Anoxic brain damage, not elsewhere classified: Secondary | ICD-10-CM

## 2014-09-23 DIAGNOSIS — I469 Cardiac arrest, cause unspecified: Secondary | ICD-10-CM

## 2014-09-23 LAB — BLOOD GAS, ARTERIAL
Acid-Base Excess: 8.6 mmol/L — ABNORMAL HIGH (ref 0.0–2.0)
Bicarbonate: 31.8 mEq/L — ABNORMAL HIGH (ref 20.0–24.0)
Drawn by: 23604
FIO2: 0.4 %
MECHVT: 550 mL
O2 SAT: 99.1 %
PEEP: 5 cmH2O
PH ART: 7.539 — AB (ref 7.350–7.450)
Patient temperature: 98.6
RATE: 12 resp/min
TCO2: 32.9 mmol/L (ref 0–100)
pCO2 arterial: 37.3 mmHg (ref 35.0–45.0)
pO2, Arterial: 89.6 mmHg (ref 80.0–100.0)

## 2014-09-23 LAB — POCT I-STAT 3, ART BLOOD GAS (G3+)
Acid-Base Excess: 8 mmol/L — ABNORMAL HIGH (ref 0.0–2.0)
Bicarbonate: 32.9 mEq/L — ABNORMAL HIGH (ref 20.0–24.0)
O2 Saturation: 98 %
PCO2 ART: 46.3 mmHg — AB (ref 35.0–45.0)
PO2 ART: 97 mmHg (ref 80.0–100.0)
Patient temperature: 99.3
TCO2: 34 mmol/L (ref 0–100)
pH, Arterial: 7.461 — ABNORMAL HIGH (ref 7.350–7.450)

## 2014-09-23 LAB — BASIC METABOLIC PANEL
Anion gap: 9 (ref 5–15)
BUN: 19 mg/dL (ref 6–23)
CHLORIDE: 103 meq/L (ref 96–112)
CO2: 33 mEq/L — ABNORMAL HIGH (ref 19–32)
CREATININE: 0.5 mg/dL (ref 0.50–1.10)
Calcium: 8.6 mg/dL (ref 8.4–10.5)
GFR calc Af Amer: >90 mL/min (ref >90)
GFR calc non Af Amer: >90 mL/min (ref >90)
Glucose, Bld: 106 mg/dL — ABNORMAL HIGH (ref 70–99)
Potassium: 3 mEq/L — ABNORMAL LOW (ref 3.7–5.3)
Sodium: 145 mEq/L (ref 137–147)

## 2014-09-23 LAB — GLUCOSE, CAPILLARY
GLUCOSE-CAPILLARY: 142 mg/dL — AB (ref 70–99)
GLUCOSE-CAPILLARY: 145 mg/dL — AB (ref 70–99)
GLUCOSE-CAPILLARY: 200 mg/dL — AB (ref 70–99)
Glucose-Capillary: 135 mg/dL — ABNORMAL HIGH (ref 70–99)
Glucose-Capillary: 133 mg/dL — ABNORMAL HIGH (ref 70–99)
Glucose-Capillary: 182 mg/dL — ABNORMAL HIGH (ref 70–99)

## 2014-09-23 LAB — TROPONIN I
Troponin I: <0.3 ng/mL (ref <0.30)
Troponin I: <0.3 ng/mL (ref <0.30)

## 2014-09-23 LAB — PHOSPHORUS: Phosphorus: <0.5 mg/dL — CL (ref 2.3–4.6)

## 2014-09-23 LAB — MAGNESIUM: Magnesium: 1.7 mg/dL (ref 1.5–2.5)

## 2014-09-23 LAB — MRSA PCR SCREENING: MRSA BY PCR: NEGATIVE

## 2014-09-23 LAB — PROCALCITONIN: Procalcitonin: <0.1 ng/mL

## 2014-09-23 MED ORDER — SODIUM CHLORIDE 0.9 % IV BOLUS (SEPSIS): 500.0000 mL | Freq: Once | INTRAVENOUS | Status: DC

## 2014-09-23 MED ORDER — POTASSIUM PHOSPHATES 15 MMOLE/5ML IV SOLN
30.0000 mmol | Freq: Once | INTRAVENOUS | Status: AC
  Administered 2014-09-23: 30 mmol via INTRAVENOUS
  Filled 2014-09-23: qty 10

## 2014-09-23 MED ORDER — VITAL HIGH PROTEIN PO LIQD
1000.0000 mL | ORAL | Status: DC
  Administered 2014-09-23: 1000 mL
  Administered 2014-09-23 – 2014-09-24 (×9)
  Administered 2014-09-24: 1000 mL
  Administered 2014-09-24: 01:00:00
  Administered 2014-09-25: 1000 mL
  Administered 2014-09-26 (×4)
  Administered 2014-09-26: 1000 mL
  Administered 2014-09-26 – 2014-09-27 (×8)
  Administered 2014-09-27: 1000 mL
  Filled 2014-09-23 (×9): qty 1000

## 2014-09-23 MED ORDER — PANTOPRAZOLE SODIUM 40 MG PO PACK
40.0000 mg | PACK | Freq: Every day | ORAL | Status: DC
  Administered 2014-09-23 – 2014-09-30 (×8): 40 mg
  Filled 2014-09-23 (×9): qty 20

## 2014-09-23 MED ORDER — FREE WATER
200.0000 mL | Freq: Three times a day (TID) | Status: DC
  Administered 2014-09-23 – 2014-09-26 (×9): 200 mL

## 2014-09-23 MED ORDER — INSULIN ASPART 100 UNIT/ML ~~LOC~~ SOLN
0.0000 [IU] | SUBCUTANEOUS | Status: DC
  Administered 2014-09-23 (×3): 2 [IU] via SUBCUTANEOUS
  Administered 2014-09-23 – 2014-09-24 (×3): 3 [IU] via SUBCUTANEOUS
  Administered 2014-09-24: 2 [IU] via SUBCUTANEOUS
  Administered 2014-09-24: 3 [IU] via SUBCUTANEOUS
  Administered 2014-09-24 – 2014-09-25 (×2): 2 [IU] via SUBCUTANEOUS
  Administered 2014-09-25: 3 [IU] via SUBCUTANEOUS
  Administered 2014-09-25 (×3): 2 [IU] via SUBCUTANEOUS
  Administered 2014-09-25: 3 [IU] via SUBCUTANEOUS
  Administered 2014-09-25 – 2014-09-26 (×2): 2 [IU] via SUBCUTANEOUS
  Administered 2014-09-26 (×2): 3 [IU] via SUBCUTANEOUS
  Administered 2014-09-26: 2 [IU] via SUBCUTANEOUS
  Administered 2014-09-26: 3 [IU] via SUBCUTANEOUS
  Administered 2014-09-27 (×3): 2 [IU] via SUBCUTANEOUS
  Administered 2014-09-27: 3 [IU] via SUBCUTANEOUS
  Administered 2014-09-27 (×2): 2 [IU] via SUBCUTANEOUS
  Administered 2014-09-28: 3 [IU] via SUBCUTANEOUS
  Administered 2014-09-28 – 2014-10-01 (×15): 2 [IU] via SUBCUTANEOUS
  Administered 2014-10-01 (×2): 3 [IU] via SUBCUTANEOUS
  Administered 2014-10-02: 2 [IU] via SUBCUTANEOUS
  Administered 2014-10-02: 3 [IU] via SUBCUTANEOUS
  Administered 2014-10-02: 2 [IU] via SUBCUTANEOUS
  Administered 2014-10-02: 3 [IU] via SUBCUTANEOUS
  Administered 2014-10-02: 2 [IU] via SUBCUTANEOUS
  Administered 2014-10-02: 3 [IU] via SUBCUTANEOUS
  Administered 2014-10-02 – 2014-10-03 (×2): 2 [IU] via SUBCUTANEOUS

## 2014-09-23 MED ORDER — CHLORHEXIDINE GLUCONATE 0.12 % MT SOLN
15.0000 mL | Freq: Two times a day (BID) | OROMUCOSAL | Status: DC
  Administered 2014-09-23 – 2014-10-01 (×18): 15 mL via OROMUCOSAL
  Filled 2014-09-23 (×18): qty 15

## 2014-09-23 MED ORDER — CETYLPYRIDINIUM CHLORIDE 0.05 % MT LIQD
7.0000 mL | Freq: Four times a day (QID) | OROMUCOSAL | Status: DC
  Administered 2014-09-23 – 2014-10-01 (×35): 7 mL via OROMUCOSAL

## 2014-09-23 NOTE — Progress Notes (Signed)
Dr. Alva Garnet paged due to low urine output. 500 cc bolus of ns ordered. Will continue to monitor.  Sandre Kitty

## 2014-09-23 NOTE — Progress Notes (Signed)
CRITICAL VALUE ALERT  Critical value received: Phosphorus <0.5  Date of notification: 09/23/14  Time of notification: 0532  Critical value read back: yes  Nurse who received alert: Vella Raring, RN  MD notified (1st page): Dr. Jimmy Footman  Time of first page: [phone]  MD notified (2nd page):  Time of second page:  Responding MD: Dr. Jimmy Footman  Time MD responded: [phone]

## 2014-09-23 NOTE — Procedures (Signed)
Central Venous Catheter Insertion Procedure Note YOLTZIN RANSOM [id number] 08/31/1946  Procedure: Insertion of Central Venous Catheter Indications: Drug and/or fluid administration  Procedure Details Consent: Unable to obtain consent because of emergent medical necessity. Time Out: Verified patient identification, verified procedure, site/side was marked, verified correct patient position, special equipment/implants available, medications/allergies/relevent history reviewed, required imaging and test results available.  Performed  Cental line was placed during ACLS.  Skin prep: Chlorhexidine; local anesthetic administered A antimicrobial bonded/coated triple lumen catheter was placed in the right femoral vein due to emergent situation using the Seldinger technique.  Evaluation Blood flow good Complications: No apparent complications Patient did tolerate procedure well.   Amrie Gurganus K. 09/23/2014, 12:18 AM

## 2014-09-23 NOTE — Progress Notes (Signed)
PULMONARY / CRITICAL CARE MEDICINE   Name: Debbie Pena MRN: [id number] DOB: [birth year]    ADMISSION DATE:  09/22/2014  CHIEF COMPLAINT:  Acute on chronic respiratory failure  INITIAL PRESENTATION: Pt with COPD and OSA/OHS, [facility] from [facility] after hospitalization for VDRF in setting RLL PNA. Developed progressive resp failure 10/17 despite BiPAP, resulted in resp arrest and PEA < 10 minutes. Pt intubated, stabilized and transferred to [place] ICU for further care.   STUDIES:  10/13 CT chest:  RLL infiltrate, B small effusions, L renal angiolipoma 10/18 CT head:    SIGNIFICANT EVENTS: 10/18 Minimally responsive. ?decorticate posturing. Persistent downward gaze.    SUBJECTIVE:  Unresponsive. Has been off all sedation X several hrs.   VITAL SIGNS: Temp:  [98.2 F (36.8 C)-99.5 F (37.5 C)] 99.5 F (37.5 C) (10/18 1100) Pulse Rate:  [47-115] 111 (10/18 1300) Resp:  [12-23] 23 (10/18 1300) BP: (94-138)/(38-70) 138/70 mmHg (10/18 1300) SpO2:  [94 %-100 %] 100 % (10/18 1300) FiO2 (%):  [40 %-60 %] 40 % (10/18 1200) Weight:  [75.8 kg (167 lb 1.7 oz)-78 kg (171 lb 15.3 oz)] 78 kg (171 lb 15.3 oz) (10/18 0500) HEMODYNAMICS: CVP:  [10 mmHg-15 mmHg] 10 mmHg VENTILATOR SETTINGS: Vent Mode:  [-] PRVC FiO2 (%):  [40 %-60 %] 40 % Set Rate:  [12 bmp] 12 bmp Vt Set:  [450 mL-550 mL] 450 mL PEEP:  [5 cmH20] 5 cmH20 Plateau Pressure:  [26 cmH20-33 cmH20] 26 cmH20 INTAKE / OUTPUT:  Intake/Output Summary (Last 24 hours) at 09/23/14 1355 Last data filed at 09/23/14 1300  Gross per 24 hour  Intake 2140.42 ml  Output    625 ml  Net 1515.42 ml    PHYSICAL EXAMINATION: General:  unresponsive Neuro:  No spontaneous movement, ? Decorticate, fixed downward gaze HEENT:  NCAT Cardiovascular:  Reg, no M Lungs:  Clear  Abdomen:  Obese, soft, +BS Ext: warm, no edema  LABS: I have reviewed all of today's lab results. Relevant abnormalities are discussed in the A/P  section  CXR: low volumes, vasc crowding, bilateral symmetric infiltrates - edema vs ALI  ASSESSMENT / PLAN:  PULMONARY OETT 10/17 >>  A: Acute on chronic resp failure Respiratory arrest 10/17 COPD without current bronchospasm OSA/OHS, not on regular CPAP Diffuse pulm infiltrates - edema vs ALI Small R pleural effusion - appears improved on CXR 10/18 P:   Cont full vent support - settings reviewed and/or adjusted Cont vent bundle Daily SBT if/when meets criteria  CARDIOVASCULAR CVL L femoral CVC 10/17 >>  A: PEA arrest, presumed primary respiratory arrest Shock post-CPR, resolved P:  CVP goal 10-14 mmHg MAP goal 65 mmHg   RENAL A:   Hypokalemia P:   Monitor BMET intermittently Monitor I/Os Correct electrolytes as indicated  GASTROINTESTINAL A:   No issues P:   SUP: enteral PPI Begin TFs  HEMATOLOGIC A:   No acute issues P:  DVT px: SQ heparin Monitor CBC intermittently Transfuse per usual ICU guidelines  INFECTIOUS A:   Suspect RLL CAP vs HCAP  P:   BCx2  10/17 >>   Abx:  Levaquin 10/9 >> 10/16 Azithro 10/17 >> 10/18 Vanco, 10/17 >>  Aztreonam 10/17 >>   Recheck PCT AM 10/19. Consider DC abx if remains low  ENDOCRINE A:   Hyperglycemia    P:   Cont CBGs/SSI  NEUROLOGIC A:   Severe encephalopathy post-arrest Abnormal neuro exam P:   Ct head 10/18 Minimize sedation/analgesia RASS goal 0  Family updated: 10/17   Interdisciplinary Family Meeting:    TODAY'S SUMMARY:  I have personally obtained a history, examined the patient, evaluated laboratory and imaging results, formulated the assessment and plan and placed orders. CRITICAL CARE: The patient is critically ill with multiple organ systems failure and requires high complexity decision making for assessment and support, frequent evaluation and titration of therapies, application of advanced monitoring technologies and extensive interpretation of multiple databases. Critical Care  Time devoted to patient care services described in this note is 35 minutes.   Merton Border, MD ; [facility] [phone].  After 5:30 PM or weekends, call [phone]

## 2014-09-23 NOTE — Progress Notes (Signed)
[place] Progress Note Patient Name: Debbie Pena DOB: Jul 22, 1946 MRN: [id number]   Date of Service  09/23/2014  HPI/Events of Note  Hypokalemia and hypophos  eICU Interventions  Potassium and phos replaced     Intervention Category Intermediate Interventions: Electrolyte abnormality - evaluation and management  [name] 09/23/2014, 5:35 AM

## 2014-09-24 ENCOUNTER — Encounter (HOSPITAL_COMMUNITY): Payer: Self-pay | Admitting: General Practice

## 2014-09-24 ENCOUNTER — Inpatient Hospital Stay (HOSPITAL_COMMUNITY): Payer: Medicare Other

## 2014-09-24 LAB — COMPREHENSIVE METABOLIC PANEL
ALK PHOS: 46 U/L (ref 39–117)
ALT: 22 U/L (ref 0–35)
AST: 12 U/L (ref 0–37)
Albumin: 2.7 g/dL — ABNORMAL LOW (ref 3.5–5.2)
Anion gap: 11 (ref 5–15)
BUN: 13 mg/dL (ref 6–23)
CO2: 31 mEq/L (ref 19–32)
Calcium: 8.6 mg/dL (ref 8.4–10.5)
Chloride: 103 mEq/L (ref 96–112)
Creatinine, Ser: 0.41 mg/dL — ABNORMAL LOW (ref 0.50–1.10)
GFR calc non Af Amer: >90 mL/min (ref >90)
Glucose, Bld: 143 mg/dL — ABNORMAL HIGH (ref 70–99)
POTASSIUM: 2.5 meq/L — AB (ref 3.7–5.3)
SODIUM: 145 meq/L (ref 137–147)
TOTAL PROTEIN: 5.6 g/dL — AB (ref 6.0–8.3)
Total Bilirubin: 0.3 mg/dL (ref 0.3–1.2)

## 2014-09-24 LAB — GLUCOSE, CAPILLARY
GLUCOSE-CAPILLARY: 157 mg/dL — AB (ref 70–99)
Glucose-Capillary: 144 mg/dL — ABNORMAL HIGH (ref 70–99)
Glucose-Capillary: 153 mg/dL — ABNORMAL HIGH (ref 70–99)
Glucose-Capillary: 150 mg/dL — ABNORMAL HIGH (ref 70–99)
Glucose-Capillary: 191 mg/dL — ABNORMAL HIGH (ref 70–99)
Glucose-Capillary: 137 mg/dL — ABNORMAL HIGH (ref 70–99)

## 2014-09-24 LAB — CBC
HCT: 28.9 % — ABNORMAL LOW (ref 36.0–46.0)
HEMOGLOBIN: 8.7 g/dL — AB (ref 12.0–15.0)
MCH: 24.4 pg — AB (ref 26.0–34.0)
MCHC: 30.1 g/dL (ref 30.0–36.0)
MCV: 81 fL (ref 78.0–100.0)
Platelets: 204 10*3/uL (ref 150–400)
RBC: 3.57 MIL/uL — ABNORMAL LOW (ref 3.87–5.11)
RDW: 15.6 % — ABNORMAL HIGH (ref 11.5–15.5)
WBC: 8.6 10*3/uL (ref 4.0–10.5)

## 2014-09-24 LAB — BASIC METABOLIC PANEL
Anion gap: 9 (ref 5–15)
BUN: 13 mg/dL (ref 6–23)
CO2: 30 meq/L (ref 19–32)
Calcium: 8.6 mg/dL (ref 8.4–10.5)
Chloride: 101 mEq/L (ref 96–112)
Creatinine, Ser: 0.39 mg/dL — ABNORMAL LOW (ref 0.50–1.10)
GFR calc non Af Amer: >90 mL/min (ref >90)
Glucose, Bld: 158 mg/dL — ABNORMAL HIGH (ref 70–99)
POTASSIUM: 3.5 meq/L — AB (ref 3.7–5.3)
Sodium: 140 mEq/L (ref 137–147)

## 2014-09-24 LAB — PROCALCITONIN: PROCALCITONIN: 0.11 ng/mL

## 2014-09-24 MED ORDER — POTASSIUM CHLORIDE 20 MEQ/15ML (10%) PO LIQD
40.0000 meq | ORAL | Status: AC
  Administered 2014-09-24 (×3): 40 meq
  Filled 2014-09-24 (×3): qty 30

## 2014-09-24 MED ORDER — DEXTROSE 5 % IV SOLN
5.0000 mg/h | INTRAVENOUS | Status: DC
  Administered 2014-09-24: 5 mg/h via INTRAVENOUS
  Administered 2014-09-24: 15 mg/h via INTRAVENOUS
  Filled 2014-09-24 (×2): qty 100

## 2014-09-24 MED ORDER — METOPROLOL TARTRATE 25 MG/10 ML ORAL SUSPENSION
12.5000 mg | Freq: Two times a day (BID) | ORAL | Status: DC
  Administered 2014-09-24 – 2014-10-01 (×14): 12.5 mg via ORAL
  Filled 2014-09-24 (×14): qty 5

## 2014-09-24 MED ORDER — PRO-STAT SUGAR FREE PO LIQD
30.0000 mL | Freq: Every day | ORAL | Status: DC
  Administered 2014-09-24 – 2014-09-28 (×5): 30 mL
  Filled 2014-09-24 (×5): qty 30

## 2014-09-24 MED ORDER — METOPROLOL TARTRATE 1 MG/ML IV SOLN
2.5000 mg | INTRAVENOUS | Status: DC | PRN
  Administered 2014-09-24 (×2): 2.5 mg via INTRAVENOUS
  Administered 2014-09-25 – 2014-10-04 (×11): 5 mg via INTRAVENOUS
  Administered 2014-10-04: 2.5 mg via INTRAVENOUS
  Administered 2014-10-05: 5 mg via INTRAVENOUS
  Filled 2014-09-24 (×16): qty 5

## 2014-09-24 MED ORDER — PRO-STAT SUGAR FREE PO LIQD: 30.0000 mL | Freq: Every day | ORAL | Status: DC

## 2014-09-24 MED ORDER — METOPROLOL TARTRATE 12.5 MG HALF TABLET
12.5000 mg | ORAL_TABLET | Freq: Two times a day (BID) | ORAL | Status: DC
  Administered 2014-09-24: 12.5 mg via ORAL
  Filled 2014-09-24 (×4): qty 1

## 2014-09-24 NOTE — Progress Notes (Signed)
[facility] ADULT ICU REPLACEMENT PROTOCOL FOR AM LAB REPLACEMENT ONLY  The patient does apply for the [facility] Adult ICU Electrolyte Replacment Protocol based on the criteria listed below:   1. Is GFR >/= 40 ml/min? Yes.    Patient's GFR today is >90 2. Is urine output >/= 0.5 ml/kg/hr for the last 6 hours? Yes.   Patient's UOP is 0.7 ml/kg/hr 3. Is BUN < 60 mg/dL? Yes.    Patient's BUN today is 13 4. Abnormal electrolyte(s): K 2.5 5. Ordered repletion with: per protocol 6. If a panic level lab has been reported, has the CCM MD in charge been notified? Yes.  .   Physician:  Dr Derinda Sis, [place] A 09/24/2014 5:43 AM

## 2014-09-24 NOTE — Progress Notes (Addendum)
INITIAL NUTRITION ASSESSMENT  DOCUMENTATION CODES Per approved criteria  -Obesity Unspecified   INTERVENTION: Continue Vital HP formula at 40 ml/hr Add Prostat liquid protein 30 ml daily via tube   Total TF regimen to provide 1060 kcals (23 kcals/kg ideal body weight), 99 gm protein (100% of estimated protein needs), 803 ml of free water RD to follow for nutrition care plan  NUTRITION DIAGNOSIS: Inadequate oral intake related to inability to eat as evidenced by NPO status  Goal: Enteral nutrition to provide 60-70% of estimated calorie needs (22-25 kcals/kg ideal body weight) and 100% of estimated protein needs, based on ASPEN guidelines for hypocaloric, high protein feeding in critically ill obese individuals  Monitor:  TF regimen & tolerance, respiratory status, weight, labs, I/O's  Reason for Assessment: Consult  68 y.o. female  Admitting Dx: acute on chronic respiratory failure  ASSESSMENT: 68 y.o. Female with COPD and OSA/OHS, [facility] after hospitalization for VDRF in setting RLL PNA. Developed progressive resp failure 10/17 despite BiPAP, resulted in resp arrest and PEA < 10 minutes.   Patient is currently intubated on ventilator support -- OGT in place MV: 7.2 L/min Temp (24hrs), Avg:99.6 F (37.6 C), Min:99.3 F (37.4 C), Max:100.1 F (37.8 C)   Vital HP formula initiated 10/18 via Adult Tube Feeding Protocol.  Currently infusing at 40 ml/hr via OGT providing 960 kcals, 84 gm protein, 803 ml of free water.  Free water flushes at 200 ml every 8 hours.  RD consulted for TF management.  Height: Ht Readings from Last 1 Encounters:  09/22/14 5' (1.524 m)    Weight: Wt Readings from Last 1 Encounters:  09/24/14 175 lb 4.3 oz (79.5 kg)    Ideal Body Weight: 100 lb  % Ideal Body Weight: 175%  Wt Readings from Last 10 Encounters:  09/24/14 175 lb 4.3 oz (79.5 kg)    Usual Body Weight: unable to obtain  % Usual Body Weight:  ---  BMI:  Body mass index is 34.23 kg/(m^2).  Estimated Nutritional Needs: Kcal: 1468 Protein: 90-100 gm Fluid: per MD  Skin: Intact  Diet Order: NPO  EDUCATION NEEDS: -No education needs identified at this time   Intake/Output Summary (Last 24 hours) at 09/24/14 1205 Last data filed at 09/24/14 1059  Gross per 24 hour  Intake 2837.5 ml  Output    830 ml  Net 2007.5 ml    Labs:   Recent Labs Lab 09/22/14 2139 09/23/14 0415 09/24/14 0420  NA 143 145 145  K 3.7 3.0* 2.5*  CL 100 103 103  CO2 34* 33* 31  BUN 19 19 13   CREATININE 0.49* 0.50 0.41*  CALCIUM 8.3* 8.6 8.6  MG 1.6 1.7  --   PHOS 2.0* <0.5*  --   GLUCOSE 215* 106* 143*    CBG (last 3)   Recent Labs  09/24/14 0326 09/24/14 0722 09/24/14 1137  GLUCAP 153* 150* 191*    Scheduled Meds: . antiseptic oral rinse  7 mL Mouth Rinse QID  . chlorhexidine  15 mL Mouth Rinse BID  . feeding supplement (VITAL HIGH PROTEIN)  1,000 mL Per Tube Q24H  . free water  200 mL Per Tube 3 times per day  . heparin  5,000 Units Subcutaneous 3 times per day  . insulin aspart  0-15 Units Subcutaneous 6 times per day  . ipratropium-albuterol  3 mL Nebulization Q6H  . metoprolol tartrate  12.5 mg Oral BID  . pantoprazole sodium  40 mg  Per Tube Q1200  . potassium chloride  40 mEq Per Tube Q4H  . sodium chloride  500 mL Intravenous Once    Continuous Infusions: . diltiazem (CARDIZEM) infusion 5 mg/hr (09/24/14 0973)    Past Medical History  Diagnosis Date  . COPD (chronic obstructive pulmonary disease)   . Obesity hypoventilation syndrome   . OSA (obstructive sleep apnea)   . HTN (hypertension)   . Schizoaffective disorder   . GERD (gastroesophageal reflux disease)   . Chronic respiratory failure with hypoxia   . Hyperlipidemia   . Renal angiolipoma   . Hepatic cyst     No past surgical history on file.  Arthur Holms, RD, LDN Pager #: [phone] After-Hours Pager #: [phone]

## 2014-09-24 NOTE — Progress Notes (Signed)
PULMONARY / CRITICAL CARE MEDICINE   Name: Debbie Pena MRN: [id number] DOB: 07-09-1946    ADMISSION DATE:  09/22/2014  CHIEF COMPLAINT:  Acute on chronic respiratory failure  INITIAL PRESENTATION: Pt with COPD and OSA/OHS, [facility] after hospitalization for VDRF in setting RLL PNA. Developed progressive resp failure 10/17 despite BiPAP, resulted in resp arrest and PEA < 10 minutes. Pt intubated, stabilized and transferred to [facility] ICU for further care.   STUDIES:  10/13 CT chest:  RLL infiltrate, B small effusions, L renal angiolipoma 10/18 CT head: ? Chronic R BG lacunar infarct vs dilated prevascular space. No evidence CVA or global anoxia  SIGNIFICANT EVENTS: 10/18 Minimally responsive. ?decorticate posturing. Persistent downward gaze.  10/19 Improving MS, following commands, nodding to questions.   SUBJECTIVE:  Awake and following some commands > an improvement compared with 10/18 Denies pain On dilt gtt, appears to now be in sinus tachy  VITAL SIGNS: Temp:  [99.3 F (37.4 C)-100.1 F (37.8 C)] 99.4 F (37.4 C) (10/19 0724) Pulse Rate:  [88-135] 109 (10/19 0900) Resp:  [12-28] 20 (10/19 0900) BP: (104-160)/(46-92) 117/58 mmHg (10/19 0900) SpO2:  [97 %-100 %] 99 % (10/19 0900) FiO2 (%):  [40 %] 40 % (10/19 0750) Weight:  [79.5 kg (175 lb 4.3 oz)] 79.5 kg (175 lb 4.3 oz) (10/19 0427) HEMODYNAMICS: CVP:  [8 mmHg-16 mmHg] 8 mmHg VENTILATOR SETTINGS: Vent Mode:  [-] PRVC FiO2 (%):  [40 %] 40 % Set Rate:  [12 bmp] 12 bmp Vt Set:  [450 mL] 450 mL PEEP:  [5 cmH20] 5 cmH20 Plateau Pressure:  [25 SEG31-51 cmH20] 32 cmH20 INTAKE / OUTPUT:  Intake/Output Summary (Last 24 hours) at 09/24/14 0931 Last data filed at 09/24/14 0900  Gross per 24 hour  Intake 2560.17 ml  Output    785 ml  Net 1775.17 ml    PHYSICAL EXAMINATION: General:  Obese intubated woman, ill appearing Neuro:  Awake, eyes open, lethargic but slowly responds to  questions, moves B UE  HEENT:  Tongue protruding slightly, PERRL, OP clear with large volume oral secretions.  Cardiovascular:  Reg, tachy 105, no M Lungs:  Clear B Abdomen:  Obese, soft, +BS Ext: warm, no edema  LABS:  Recent Labs Lab 09/22/14 2139 09/24/14 0420  HGB 8.7* 8.7*  HCT 30.8* 28.9*  WBC 15.3* 8.6  PLT 213 204    Recent Labs Lab 09/21/14 1632 09/22/14 2139 09/23/14 0415 09/24/14 0420  NA 144 143 145 145  K 4.2 3.7 3.0* 2.5*  CL 99 100 103 103  CO2 37* 34* 33* 31  GLUCOSE 180* 215* 106* 143*  BUN 15 19 19 13   CREATININE 0.48* 0.49* 0.50 0.41*  CALCIUM 9.1 8.3* 8.6 8.6  MG  --  1.6 1.7  --   PHOS  --  2.0* <0.5*  --     Recent Labs Lab 09/22/14 1512 09/22/14 1913 09/22/14 2136 09/23/14 0425 09/23/14 0905  PHART 7.231* 7.277* 7.422 7.539* 7.461*  PCO2ART 94.9* 83.9* 51.5* 37.3 46.3*  PO2ART 113.0* 347.0* 121.0* 89.6 97.0  HCO3 38.4* 39.1* 33.5* 31.8* 32.9*  TCO2 41.4 42 35 32.9 34  O2SAT 98.3 100.0 99.0 99.1 98.0    Recent Labs Lab 09/22/14 2139  INR 1.16    CXR 10/19 >> very small volumes, R > L LL atx  ASSESSMENT / PLAN:  PULMONARY OETT 10/17 >>  A: Acute on chronic resp failure Respiratory arrest 10/17 COPD without current bronchospasm OSA/OHS, not on  regular CPAP Diffuse pulm infiltrates - edema vs ALI Small R pleural effusion - appears improved on CXR 10/18 and 10/19 P:   Cont vent support as ordered.  Cont vent bundle, VAP prevention Daily SBT when meets criteria. MS is improved 10/19 - anticipate that she can try PSV.   CARDIOVASCULAR CVL L femoral CVC 10/17 >>  A: PEA arrest, presumed primary respiratory arrest Shock post-CPR, resolved Sinus tachycardia, frequent PAC's vs A Fib P:  CVP goal 10-12 mmHg MAP goal 65 mmHg Attempt to d/c femoral CVC catheter asap Wean dilt gtt to off, restart (lower dose) metoprolol 12.5 bid Consider addition diuretics, note >3L positive 10/19. Will wait until K+ repleted  RENAL A:    Hypokalemia P:   Monitor BMET intermittently Monitor I/Os Correct electrolytes as indicated, potassium given 10/19 am  GASTROINTESTINAL A:   Nutrition P:   SUP: enteral PPI Continue TFs  HEMATOLOGIC A:   No acute issues P:  DVT px: SQ heparin Monitor CBC intermittently Transfuse per usual ICU guidelines  INFECTIOUS A:   Suspect RLL CAP vs HCAP  P:   BCx2  10/17 >>   Abx:  Levaquin 10/9 >> 10/16 Azithro 10/17 >> 10/18 Vanco, 10/17 >> 10/19 Aztreonam 10/17 >> 10/19  Pct 0.11 on 10/19. Will d/c abx and follow clinically on 10/19.   ENDOCRINE A:   Hyperglycemia    P:   Cont CBGs/SSI  NEUROLOGIC A:   Severe encephalopathy post-arrest, improving on 10/19 Abnormal neuro exam P:   Ct head 10/18 reassuring Minimize sedation/analgesia RASS goal 0   Family updated: 10/17   Interdisciplinary Family Meeting:  None to date   TODAY'S SUMMARY:  I have personally obtained a history, examined the patient, evaluated laboratory and imaging results, formulated the assessment and plan and placed orders.  CRITICAL CARE: The patient is critically ill with multiple organ systems failure and requires high complexity decision making for assessment and support, frequent evaluation and titration of therapies, application of advanced monitoring technologies and extensive interpretation of multiple databases. Critical Care Time devoted to patient care services by me described in this note is 40 minutes.   Baltazar Apo, MD, PhD 09/24/2014, 9:53 AM [facility] [phone] or if no answer [phone]

## 2014-09-24 NOTE — Progress Notes (Signed)
CRITICAL VALUE ALERT  Critical value received:  K 2.5  Date of notification:  09/24/14  Time of notification:  0538  Critical value read back:Yes.    Nurse who received alert:  Rolla Plate   MD notified (1st page):  [facility] Nurse Gretchen/MD Nelda Marseille  Time of first page:  [phone]   Responding MD:  Warren Lacy RN Gretchen/MD Nelda Marseille  Time MD responded:  [phone]

## 2014-09-24 NOTE — Progress Notes (Addendum)
Spoke with Elink MD Nelda Marseille regarding pt's increased HR & PACs with irregular HR reaching the 150s. 12-lead EKG done reads "Sinus Tach with frequent PACs". BP stable 147/77 (91).  Orders received to start Cardizem gtt. Will implement and continue to monitor.    0500   Despite Cardizem being titrated up to 15mg /hr, HR remains 115-138. New orders received for PRN lopressor.

## 2014-09-25 ENCOUNTER — Inpatient Hospital Stay (HOSPITAL_COMMUNITY): Payer: Medicare Other

## 2014-09-25 LAB — GLUCOSE, CAPILLARY
GLUCOSE-CAPILLARY: 145 mg/dL — AB (ref 70–99)
GLUCOSE-CAPILLARY: 162 mg/dL — AB (ref 70–99)
GLUCOSE-CAPILLARY: 154 mg/dL — AB (ref 70–99)
GLUCOSE-CAPILLARY: 125 mg/dL — AB (ref 70–99)
Glucose-Capillary: 131 mg/dL — ABNORMAL HIGH (ref 70–99)
Glucose-Capillary: 135 mg/dL — ABNORMAL HIGH (ref 70–99)

## 2014-09-25 LAB — BASIC METABOLIC PANEL
Anion gap: 8 (ref 5–15)
Anion gap: 8 (ref 5–15)
BUN: 13 mg/dL (ref 6–23)
BUN: 16 mg/dL (ref 6–23)
CO2: 31 meq/L (ref 19–32)
CO2: 32 meq/L (ref 19–32)
CREATININE: 0.39 mg/dL — AB (ref 0.50–1.10)
Calcium: 8.7 mg/dL (ref 8.4–10.5)
Calcium: 9.2 mg/dL (ref 8.4–10.5)
Chloride: 107 mEq/L (ref 96–112)
Chloride: 106 mEq/L (ref 96–112)
Creatinine, Ser: 0.4 mg/dL — ABNORMAL LOW (ref 0.50–1.10)
GFR calc Af Amer: >90 mL/min (ref >90)
GFR calc Af Amer: >90 mL/min (ref >90)
GFR calc non Af Amer: >90 mL/min (ref >90)
Glucose, Bld: 139 mg/dL — ABNORMAL HIGH (ref 70–99)
Glucose, Bld: 120 mg/dL — ABNORMAL HIGH (ref 70–99)
Potassium: 3.4 mEq/L — ABNORMAL LOW (ref 3.7–5.3)
Potassium: 4.4 mEq/L (ref 3.7–5.3)
SODIUM: 146 meq/L (ref 137–147)
Sodium: 146 mEq/L (ref 137–147)

## 2014-09-25 LAB — CLOSTRIDIUM DIFFICILE BY PCR: Toxigenic C. Difficile by PCR: NEGATIVE

## 2014-09-25 MED ORDER — SODIUM CHLORIDE 0.9 % IJ SOLN
10.0000 mL | INTRAMUSCULAR | Status: DC | PRN
  Administered 2014-09-25: 20 mL
  Administered 2014-10-05 – 2014-10-06 (×2): 10 mL

## 2014-09-25 MED ORDER — POTASSIUM CHLORIDE 20 MEQ/15ML (10%) PO LIQD
40.0000 meq | ORAL | Status: AC
  Administered 2014-09-25 (×2): 40 meq
  Filled 2014-09-25 (×2): qty 30

## 2014-09-25 MED ORDER — SODIUM CHLORIDE 0.9 % IV SOLN
1.0000 mg/h | INTRAVENOUS | Status: DC
  Administered 2014-09-25: 1 mg/h via INTRAVENOUS
  Filled 2014-09-25: qty 10

## 2014-09-25 MED ORDER — SODIUM CHLORIDE 0.9 % IJ SOLN
10.0000 mL | Freq: Two times a day (BID) | INTRAMUSCULAR | Status: DC
  Administered 2014-09-25 – 2014-10-10 (×15): 10 mL

## 2014-09-25 MED ORDER — FUROSEMIDE 10 MG/ML IJ SOLN
40.0000 mg | Freq: Every day | INTRAMUSCULAR | Status: DC
Start: 2014-09-25 — End: 2014-09-26
  Administered 2014-09-25 – 2014-09-26 (×2): 40 mg via INTRAVENOUS
  Filled 2014-09-25 (×3): qty 4

## 2014-09-25 NOTE — Progress Notes (Signed)
Patient continuing to have loose stools with every turn.  Finally consistency for Flexi-seal.  Dr. Melvyn Novas notified and order received for flexi-seal to be inserted.  Placed flex-seal without difficulty.  40cc water added to tube.  Patient tolerated procedure well.

## 2014-09-25 NOTE — Progress Notes (Signed)
PULMONARY / CRITICAL CARE MEDICINE   Name: Debbie Pena MRN: [id number] DOB: 1946-04-19    ADMISSION DATE:  09/22/2014  CHIEF COMPLAINT:  Acute on chronic respiratory failure  INITIAL PRESENTATION: Pt with COPD and OSA/OHS, [facility] after hospitalization for VDRF in setting RLL PNA. Developed progressive resp failure 10/17 despite BiPAP, resulted in resp arrest and PEA < 10 minutes. Pt intubated, stabilized and transferred to [facility] ICU for further care.   STUDIES:  10/13 CT chest:  RLL infiltrate, B small effusions, L renal angiolipoma 10/18 CT head: ? Chronic R BG lacunar infarct vs dilated prevascular space. No evidence CVA or global anoxia  SIGNIFICANT EVENTS: 10/18 Minimally responsive. ?decorticate posturing. Persistent downward gaze.  10/19 Improving MS, following commands, nodding to questions.   SUBJECTIVE:  Awake and following commands > more improvement from 10/19 Tolerating some PSV 15  VITAL SIGNS: Temp:  [98.8 F (37.1 C)-100.1 F (37.8 C)] 98.8 F (37.1 C) (10/20 0758) Pulse Rate:  [84-141] 109 (10/20 1017) Resp:  [12-21] 17 (10/20 0900) BP: (92-161)/(43-90) 114/58 mmHg (10/20 1017) SpO2:  [100 %] 100 % (10/20 0900) FiO2 (%):  [40 %] 40 % (10/20 0758) Weight:  [78.9 kg (173 lb 15.1 oz)] 78.9 kg (173 lb 15.1 oz) (10/20 0500) HEMODYNAMICS: CVP:  [5 mmHg-18 mmHg] 15 mmHg VENTILATOR SETTINGS: Vent Mode:  [-] PRVC FiO2 (%):  [40 %] 40 % Set Rate:  [12 bmp] 12 bmp Vt Set:  [500 mL] 500 mL PEEP:  [5 cmH20] 5 cmH20 Plateau Pressure:  [25 cmH20-30 cmH20] 25 cmH20 INTAKE / OUTPUT:  Intake/Output Summary (Last 24 hours) at 09/25/14 1119 Last data filed at 09/25/14 0600  Gross per 24 hour  Intake   1630 ml  Output    625 ml  Net   1005 ml    PHYSICAL EXAMINATION: General:  Obese intubated woman, NAD Neuro:  More awake, follow commands, somewhat weak cough HEENT:  PERRL, OP clear, oral secretions have  decreased Cardiovascular:  Reg, no M Lungs:  Clear B Abdomen:  Obese, soft, +BS Ext: warm, no edema  LABS:  Recent Labs Lab 09/22/14 2139 09/24/14 0420  HGB 8.7* 8.7*  HCT 30.8* 28.9*  WBC 15.3* 8.6  PLT 213 204    Recent Labs Lab 09/22/14 2139 09/23/14 0415 09/24/14 0420 09/24/14 2102 09/25/14 0438  NA 143 145 145 140 146  K 3.7 3.0* 2.5* 3.5* 3.4*  CL 100 103 103 101 107  CO2 34* 33* 31 30 31   GLUCOSE 215* 106* 143* 158* 139*  BUN 19 19 13 13 13   CREATININE 0.49* 0.50 0.41* 0.39* 0.40*  CALCIUM 8.3* 8.6 8.6 8.6 8.7  MG 1.6 1.7  --   --   --   PHOS 2.0* <0.5*  --   --   --     Recent Labs Lab 09/22/14 1512 09/22/14 1913 09/22/14 2136 09/23/14 0425 09/23/14 0905  PHART 7.231* 7.277* 7.422 7.539* 7.461*  PCO2ART 94.9* 83.9* 51.5* 37.3 46.3*  PO2ART 113.0* 347.0* 121.0* 89.6 97.0  HCO3 38.4* 39.1* 33.5* 31.8* 32.9*  TCO2 41.4 42 35 32.9 34  O2SAT 98.3 100.0 99.0 99.1 98.0    Recent Labs Lab 09/22/14 2139  INR 1.16    CXR 10/19 >> very small volumes, R > L LL atx  ASSESSMENT / PLAN:  PULMONARY OETT 10/17 >>  A: Acute on chronic resp failure Respiratory arrest 10/17 COPD without current bronchospasm OSA/OHS, not on regular CPAP Diffuse pulm infiltrates -  edema vs ALI Small R pleural effusion - appears improved on CXR 10/18 and 10/19 P:   Cont vent support as ordered.  Cont vent bundle, VAP prevention Daily SBT, attempt to decrease PS as she can tolerate Follow CXR  CARDIOVASCULAR CVL L femoral CVC 10/17 >>  A: PEA arrest, presumed primary respiratory arrest Shock post-CPR, resolved Sinus tachycardia, frequent PAC's vs A Fib P:  CVP goal 10-12 mmHg MAP goal 65 mmHg Attempt to d/c femoral CVC catheter asap Restarted metoprolol 12.5 bid Add diuretic 10/20, lasix 40mg  qd  RENAL A:   Hypokalemia P:   Monitor BMET intermittently Monitor I/Os Correct electrolytes as indicated, potassium given 10/19 and 10/20  GASTROINTESTINAL A:    Nutrition P:   SUP: enteral PPI Continue TFs  HEMATOLOGIC A:   No acute issues P:  DVT px: SQ heparin Monitor CBC intermittently Transfuse per usual ICU guidelines  INFECTIOUS A:   Suspect RLL CAP vs HCAP  P:   BCx2  10/17 >>   Abx:  Levaquin 10/9 >> 10/16 Azithro 10/17 >> 10/18 Vanco, 10/17 >> 10/19 Aztreonam 10/17 >> 10/19  Pct 0.11 on 10/19. Will d/c abx and follow clinically on 10/19.   ENDOCRINE A:   Hyperglycemia    P:   Cont CBGs/SSI  NEUROLOGIC A:   Severe encephalopathy post-arrest, improving on 10/19 Abnormal neuro exam P:   Ct head 10/18 reassuring Minimize sedation/analgesia RASS goal 0   Family updated: 10/17 and 10/19   Interdisciplinary Family Meeting:  None to date   TODAY'S SUMMARY:  I have personally obtained a history, examined the patient, evaluated laboratory and imaging results, formulated the assessment and plan and placed orders.  CRITICAL CARE: The patient is critically ill with multiple organ systems failure and requires high complexity decision making for assessment and support, frequent evaluation and titration of therapies, application of advanced monitoring technologies and extensive interpretation of multiple databases. Critical Care Time devoted to patient care services by me described in this note is 35 minutes.   Baltazar Apo, MD, PhD 09/25/2014, 11:19 AM [facility] [phone] or if no answer [phone]

## 2014-09-25 NOTE — Progress Notes (Signed)
Warren Lacy MD notified of patient temperature of 101.2 F. Stated he did not want further interventions at this time and is hoping to determine the effectiveness of the antibiotics by the patient's temperature.  Will continue to monitor carefully.   Allen Derry, RN.

## 2014-09-25 NOTE — Progress Notes (Signed)
Peripherally Inserted Central Catheter/Midline Placement  The IV Nurse has discussed with the patient and/or persons authorized to consent for the patient, the purpose of this procedure and the potential benefits and risks involved with this procedure.  The benefits include less needle sticks, lab draws from the catheter and patient may be discharged home with the catheter.  Risks include, but not limited to, infection, bleeding, blood clot (thrombus formation), and puncture of an artery; nerve damage and irregular heat beat.  Alternatives to this procedure were also discussed.  PICC/Midline Placement Documentation  PICC / Midline Double Lumen 02/77/41 PICC Left Basilic 41 cm 2 cm (Active)       Henderson [place] 09/25/2014, 5:02 PM Consent obtained from daughter at bedside.

## 2014-09-25 NOTE — Progress Notes (Signed)
[place] Progress Note Patient Name: Debbie Pena DOB: 1946-09-26 MRN: [id number]   Date of Service  09/25/2014  HPI/Events of Note  Nursing requesting rectal tube  eICU Interventions  flexiseal ordered     Intervention Category Minor Interventions: Routine modifications to care plan (e.g. PRN medications for pain, fever)  Christinia Gully 09/25/2014, 6:25 PM

## 2014-09-25 NOTE — Progress Notes (Signed)
RT note- unable to see patient for ventilator check, will continue to monitor.

## 2014-09-25 NOTE — Progress Notes (Signed)
[place] Progress Note Patient Name: MARLAYA TURCK DOB: Oct 06, 1946 MRN: [id number]   Date of Service  09/25/2014  HPI/Events of Note  Agitation off versed  eICU Interventions  Restart drip to Rass 0     Intervention Category Major Interventions: Delirium, psychosis, severe agitation - evaluation and management  Christinia Gully 09/25/2014, 7:59 PM

## 2014-09-26 DIAGNOSIS — J449 Chronic obstructive pulmonary disease, unspecified: Secondary | ICD-10-CM

## 2014-09-26 LAB — BASIC METABOLIC PANEL
ANION GAP: 8 (ref 5–15)
BUN: 16 mg/dL (ref 6–23)
CHLORIDE: 108 meq/L (ref 96–112)
CO2: 32 meq/L (ref 19–32)
CREATININE: 0.42 mg/dL — AB (ref 0.50–1.10)
Calcium: 8.9 mg/dL (ref 8.4–10.5)
GFR calc non Af Amer: >90 mL/min (ref >90)
Glucose, Bld: 149 mg/dL — ABNORMAL HIGH (ref 70–99)
POTASSIUM: 3.4 meq/L — AB (ref 3.7–5.3)
SODIUM: 148 meq/L — AB (ref 137–147)

## 2014-09-26 LAB — GLUCOSE, CAPILLARY
GLUCOSE-CAPILLARY: 150 mg/dL — AB (ref 70–99)
GLUCOSE-CAPILLARY: 151 mg/dL — AB (ref 70–99)
GLUCOSE-CAPILLARY: 134 mg/dL — AB (ref 70–99)
Glucose-Capillary: 160 mg/dL — ABNORMAL HIGH (ref 70–99)
Glucose-Capillary: 157 mg/dL — ABNORMAL HIGH (ref 70–99)
Glucose-Capillary: 123 mg/dL — ABNORMAL HIGH (ref 70–99)
Glucose-Capillary: 140 mg/dL — ABNORMAL HIGH (ref 70–99)

## 2014-09-26 LAB — CBC
HCT: 26.2 % — ABNORMAL LOW (ref 36.0–46.0)
Hemoglobin: 8 g/dL — ABNORMAL LOW (ref 12.0–15.0)
MCH: 24.9 pg — ABNORMAL LOW (ref 26.0–34.0)
MCHC: 30.5 g/dL (ref 30.0–36.0)
MCV: 81.6 fL (ref 78.0–100.0)
PLATELETS: 191 10*3/uL (ref 150–400)
RBC: 3.21 MIL/uL — AB (ref 3.87–5.11)
RDW: 16.3 % — ABNORMAL HIGH (ref 11.5–15.5)
WBC: 9.4 10*3/uL (ref 4.0–10.5)

## 2014-09-26 LAB — PHOSPHORUS: Phosphorus: 2.7 mg/dL (ref 2.3–4.6)

## 2014-09-26 LAB — MAGNESIUM: MAGNESIUM: 1.7 mg/dL (ref 1.5–2.5)

## 2014-09-26 MED ORDER — MAGNESIUM SULFATE 40 MG/ML IJ SOLN
2.0000 g | Freq: Once | INTRAMUSCULAR | Status: AC
  Administered 2014-09-26: 2 g via INTRAVENOUS
  Filled 2014-09-26: qty 50

## 2014-09-26 MED ORDER — FUROSEMIDE 10 MG/ML IJ SOLN
40.0000 mg | Freq: Two times a day (BID) | INTRAMUSCULAR | Status: DC
  Administered 2014-09-26 – 2014-09-30 (×8): 40 mg via INTRAVENOUS
  Filled 2014-09-26 (×14): qty 4

## 2014-09-26 MED ORDER — FREE WATER
300.0000 mL | Freq: Four times a day (QID) | Status: DC
  Administered 2014-09-26 – 2014-10-01 (×20): 300 mL

## 2014-09-26 MED ORDER — POTASSIUM CHLORIDE 20 MEQ/15ML (10%) PO LIQD
20.0000 meq | ORAL | Status: AC
  Administered 2014-09-26 (×2): 20 meq
  Filled 2014-09-26 (×2): qty 15

## 2014-09-26 MED ORDER — FENTANYL CITRATE 0.05 MG/ML IJ SOLN
INTRAMUSCULAR | Status: AC
  Administered 2014-09-26: 100 ug via INTRAVENOUS
  Filled 2014-09-26: qty 2

## 2014-09-26 MED ORDER — FENTANYL CITRATE 0.05 MG/ML IJ SOLN
25.0000 ug | INTRAMUSCULAR | Status: DC | PRN
  Administered 2014-09-26 (×2): 100 ug via INTRAVENOUS
  Administered 2014-09-27: 50 ug via INTRAVENOUS
  Administered 2014-09-27 (×3): 100 ug via INTRAVENOUS
  Administered 2014-09-27: 50 ug via INTRAVENOUS
  Administered 2014-09-27: 100 ug via INTRAVENOUS
  Administered 2014-09-27 (×2): 50 ug via INTRAVENOUS
  Administered 2014-09-28: 25 ug via INTRAVENOUS
  Administered 2014-09-28 (×2): 50 ug via INTRAVENOUS
  Administered 2014-09-28 (×2): 25 ug via INTRAVENOUS
  Administered 2014-09-29: 100 ug via INTRAVENOUS
  Administered 2014-09-29 – 2014-09-30 (×4): 50 ug via INTRAVENOUS
  Administered 2014-09-30: 100 ug via INTRAVENOUS
  Administered 2014-09-30 – 2014-10-01 (×2): 50 ug via INTRAVENOUS
  Filled 2014-09-26 (×18): qty 2

## 2014-09-26 MED ORDER — POTASSIUM CHLORIDE 20 MEQ/15ML (10%) PO LIQD
40.0000 meq | Freq: Every day | ORAL | Status: DC
  Administered 2014-09-27: 40 meq
  Filled 2014-09-26 (×3): qty 30

## 2014-09-26 NOTE — Progress Notes (Signed)
RT note-placed back to full support PRVC due increase work of breathing, HR 130's. Suctioned for moderate amount thick clear.

## 2014-09-26 NOTE — Progress Notes (Signed)
[facility] MD notified of marginal urine output.  No further orders at this time.  Will continue to monitor closely.  Allen Derry, RN

## 2014-09-26 NOTE — Progress Notes (Signed)
[facility] ADULT ICU REPLACEMENT PROTOCOL FOR AM LAB REPLACEMENT ONLY  The patient does apply for the [facility] Adult ICU Electrolyte Replacment Protocol based on the criteria listed below:   1. Is GFR >/= 40 ml/min? Yes.    Patient's GFR today is >90 2. Is urine output >/= 0.5 ml/kg/hr for the last 6 hours? Yes.   Patient's UOP is 0.5 ml/kg/hr 3. Is BUN < 60 mg/dL? Yes.    Patient's BUN today is 16 4. Abnormal electrolyte(s): K3.4,Mg1.7 5. Ordered repletion with: KCL-tube/Mg-IV 6. If a panic level lab has been reported, has the CCM MD in charge been notified? Yes.  .   Physician:  E Deterding,MD  Vear Clock 09/26/2014 6:07 AM

## 2014-09-26 NOTE — Progress Notes (Signed)
MD notified that patient has become agitated, HR into 130-140's and fighting ventilator. Sedation was discontinued earlier so new orders were received by Dr. Elsworth Soho to give Fent IV PRN for pain/agitation. Patient's family at bedside and aware of changes. Will continue to monitor. Richardean Sale, RN

## 2014-09-26 NOTE — Progress Notes (Signed)
PULMONARY / CRITICAL CARE MEDICINE   Name: Debbie Pena MRN: [id number] DOB: Mar 21, 1946    ADMISSION DATE:  09/22/2014  CHIEF COMPLAINT:  Acute on chronic respiratory failure  INITIAL PRESENTATION: Pt with COPD and OSA/OHS, [facility] after hospitalization for VDRF in setting RLL PNA. Developed progressive resp failure 10/17 despite BiPAP, resulted in resp arrest and PEA < 10 minutes. Pt intubated, stabilized and transferred to [facility] ICU for further care.   STUDIES:  10/13 CT chest:  RLL infiltrate, B small effusions, L renal angiolipoma 10/18 CT head: ? Chronic R BG lacunar infarct vs dilated prevascular space. No evidence CVA or global anoxia  SIGNIFICANT EVENTS: 10/18 Minimally responsive. ?decorticate posturing. Persistent downward gaze.  10/19 Improving MS, following commands, nodding to questions.   SUBJECTIVE:  Has tolerated PSV 14, but no lower.  Awake and interacting, was agitated overnight and received versed.   VITAL SIGNS: Temp:  [99.1 F (37.3 C)-101.2 F (38.4 C)] 99.1 F (37.3 C) (10/21 1206) Pulse Rate:  [86-117] 117 (10/21 0900) Resp:  [12-32] 23 (10/21 0900) BP: (100-148)/(42-84) 113/50 mmHg (10/21 0900) SpO2:  [98 %-100 %] 98 % (10/21 0900) FiO2 (%):  [40 %] 40 % (10/21 1209) Weight:  [76.5 kg (168 lb 10.4 oz)] 76.5 kg (168 lb 10.4 oz) (10/21 0350) HEMODYNAMICS: CVP:  [9 mmHg-13 mmHg] 10 mmHg VENTILATOR SETTINGS: Vent Mode:  [-] PSV;CPAP FiO2 (%):  [40 %] 40 % Set Rate:  [12 bmp] 12 bmp Vt Set:  [50 mL-500 mL] 500 mL PEEP:  [5 cmH20] 5 cmH20 Pressure Support:  [14 cmH20] 14 cmH20 Plateau Pressure:  [26 cmH20-27 cmH20] 27 cmH20 INTAKE / OUTPUT:  Intake/Output Summary (Last 24 hours) at 09/26/14 1227 Last data filed at 09/26/14 1000  Gross per 24 hour  Intake 1590.87 ml  Output   1835 ml  Net -244.13 ml    PHYSICAL EXAMINATION: General:  Obese intubated woman, NAD Neuro:  More awake, follow commands, somewhat  weak cough HEENT:  PERRL, OP clear, oral secretions have decreased Cardiovascular:  Reg, no M Lungs:  Clear B Abdomen:  Obese, soft, +BS Ext: warm, no edema  LABS:  Recent Labs Lab 09/22/14 2139 09/24/14 0420 09/26/14 0400  HGB 8.7* 8.7* 8.0*  HCT 30.8* 28.9* 26.2*  WBC 15.3* 8.6 9.4  PLT 213 204 191    Recent Labs Lab 09/22/14 2139 09/23/14 0415 09/24/14 0420 09/24/14 2102 09/25/14 0438 09/25/14 1740 09/26/14 0400  NA 143 145 145 140 146 146 148*  K 3.7 3.0* 2.5* 3.5* 3.4* 4.4 3.4*  CL 100 103 103 101 107 106 108  CO2 34* 33* 31 30 31  32 32  GLUCOSE 215* 106* 143* 158* 139* 120* 149*  BUN 19 19 13 13 13 16 16   CREATININE 0.49* 0.50 0.41* 0.39* 0.40* 0.39* 0.42*  CALCIUM 8.3* 8.6 8.6 8.6 8.7 9.2 8.9  MG 1.6 1.7  --   --   --   --  1.7  PHOS 2.0* <0.5*  --   --   --   --  2.7    Recent Labs Lab 09/22/14 1512 09/22/14 1913 09/22/14 2136 09/23/14 0425 09/23/14 0905  PHART 7.231* 7.277* 7.422 7.539* 7.461*  PCO2ART 94.9* 83.9* 51.5* 37.3 46.3*  PO2ART 113.0* 347.0* 121.0* 89.6 97.0  HCO3 38.4* 39.1* 33.5* 31.8* 32.9*  TCO2 41.4 42 35 32.9 34  O2SAT 98.3 100.0 99.0 99.1 98.0    Recent Labs Lab 09/22/14 2139  INR 1.16  CXR 10/19 >> very small volumes, R > L LL atx  ASSESSMENT / PLAN:  PULMONARY OETT 10/17 >>  A: Acute on chronic resp failure Respiratory arrest 10/17 COPD without current bronchospasm OSA/OHS, not on regular CPAP Diffuse pulm infiltrates - edema vs ALI Small R pleural effusion - appears improved on CXR 10/18 and 10/19 P:   Cont vent support as ordered.  Cont vent bundle, VAP prevention Daily SBT, attempt to decrease PS as she can tolerate Follow CXR  CARDIOVASCULAR CVL L femoral CVC 10/17 >> 10/20 A: PEA arrest, presumed primary respiratory arrest Shock post-CPR, resolved Sinus tachycardia, frequent PAC's vs A Fib P:  CVP goal 10-12 mmHg MAP goal 65 mmHg Restarted metoprolol 12.5 bid Increase diuretic 10/21, lasix  40mg  bid  RENAL A:   Hypokalemia P:   Monitor BMET intermittently Monitor I/Os Correct electrolytes as indicated Scheduled KCl  GASTROINTESTINAL A:   Nutrition P:   SUP: enteral PPI Continue TFs  HEMATOLOGIC A:   No acute issues P:  DVT px: SQ heparin Monitor CBC intermittently Transfuse per usual ICU guidelines  INFECTIOUS A:   Suspect RLL CAP vs HCAP  P:   BCx2  10/17 >>   Abx:  Levaquin 10/9 >> 10/16 Azithro 10/17 >> 10/18 Vanco, 10/17 >> 10/19 Aztreonam 10/17 >> 10/19  Pct 0.11 on 10/19; d/c abx and follow clinically on 10/19.   ENDOCRINE A:   Hyperglycemia    P:   Cont CBGs/SSI  NEUROLOGIC A:   Severe encephalopathy post-arrest, improving on 10/19 Abnormal neuro exam P:   Ct head 10/18 reassuring Minimize sedation/analgesia RASS goal 0   Family updated: 10/21 at bedside   Interdisciplinary Family Meeting:  None to date   TODAY'S SUMMARY:  I have personally obtained a history, examined the patient, evaluated laboratory and imaging results, formulated the assessment and plan and placed orders.  CRITICAL CARE: The patient is critically ill with multiple organ systems failure and requires high complexity decision making for assessment and support, frequent evaluation and titration of therapies, application of advanced monitoring technologies and extensive interpretation of multiple databases. Critical Care Time devoted to patient care services by me described in this note is 35 minutes.   Baltazar Apo, MD, PhD 09/26/2014, 12:27 PM [facility] [phone] or if no answer [phone]

## 2014-09-26 NOTE — Progress Notes (Signed)
PCCM INterval Note  Spoke with the patient, her daughter and nephew at bedside. Reviewed her issues, her progress, current barriers to extubation. I also reviewed the possible courses here > extubation with either transition to comfort if she fails or transition to trach and long-term vent if she fails. Pt has stated already that she does not want a trach. I have recommended that long-term vent andtrach are likely not in her best interests w regard to her QOL. They have agreed to discuss these issues as a family. In meantime we are working towards a hopeful extubation attempt in next few days  Baltazar Apo, MD, PhD 09/26/2014, 1:11 PM [place] [facility] [phone] or if no answer [phone]

## 2014-09-27 LAB — TRIGLYCERIDES: TRIGLYCERIDES: 169 mg/dL — AB (ref <150)

## 2014-09-27 LAB — GLUCOSE, CAPILLARY
GLUCOSE-CAPILLARY: 153 mg/dL — AB (ref 70–99)
GLUCOSE-CAPILLARY: 144 mg/dL — AB (ref 70–99)
GLUCOSE-CAPILLARY: 146 mg/dL — AB (ref 70–99)
Glucose-Capillary: 132 mg/dL — ABNORMAL HIGH (ref 70–99)
Glucose-Capillary: 144 mg/dL — ABNORMAL HIGH (ref 70–99)
Glucose-Capillary: 131 mg/dL — ABNORMAL HIGH (ref 70–99)

## 2014-09-27 LAB — BASIC METABOLIC PANEL
ANION GAP: 8 (ref 5–15)
BUN: 20 mg/dL (ref 6–23)
CALCIUM: 9 mg/dL (ref 8.4–10.5)
CO2: 33 mEq/L — ABNORMAL HIGH (ref 19–32)
CREATININE: 0.37 mg/dL — AB (ref 0.50–1.10)
Chloride: 99 mEq/L (ref 96–112)
GFR calc Af Amer: >90 mL/min (ref >90)
GFR calc non Af Amer: >90 mL/min (ref >90)
Glucose, Bld: 136 mg/dL — ABNORMAL HIGH (ref 70–99)
Potassium: 4.6 mEq/L (ref 3.7–5.3)
Sodium: 140 mEq/L (ref 137–147)

## 2014-09-27 LAB — MAGNESIUM: Magnesium: 2 mg/dL (ref 1.5–2.5)

## 2014-09-27 MED ORDER — PROPOFOL 10 MG/ML IV EMUL
INTRAVENOUS | Status: AC
  Filled 2014-09-27: qty 50

## 2014-09-27 MED ORDER — SCOPOLAMINE 1 MG/3DAYS TD PT72
1.0000 | MEDICATED_PATCH | TRANSDERMAL | Status: DC
  Administered 2014-09-27 – 2014-09-30 (×2): 1.5 mg via TRANSDERMAL
  Filled 2014-09-27 (×2): qty 1

## 2014-09-27 MED ORDER — MIDAZOLAM HCL 2 MG/2ML IJ SOLN
2.0000 mg | INTRAMUSCULAR | Status: DC | PRN
  Administered 2014-09-27 – 2014-09-29 (×2): 2 mg via INTRAVENOUS
  Filled 2014-09-27 (×2): qty 2

## 2014-09-27 MED ORDER — PROPOFOL 10 MG/ML IV EMUL
0.0000 ug/kg/min | INTRAVENOUS | Status: DC
  Administered 2014-09-27: 50 ug/kg/min via INTRAVENOUS
  Administered 2014-09-27: 40 ug/kg/min via INTRAVENOUS
  Administered 2014-09-27 – 2014-09-28 (×2): 50 ug/kg/min via INTRAVENOUS
  Administered 2014-09-28: 20 ug/kg/min via INTRAVENOUS
  Administered 2014-09-29: 25 ug/kg/min via INTRAVENOUS
  Administered 2014-09-29: 15 ug/kg/min via INTRAVENOUS
  Administered 2014-09-30 – 2014-10-01 (×3): 20 ug/kg/min via INTRAVENOUS
  Filled 2014-09-27 (×11): qty 100

## 2014-09-27 MED ORDER — ONDANSETRON HCL 4 MG/2ML IJ SOLN
4.0000 mg | Freq: Once | INTRAMUSCULAR | Status: AC
  Administered 2014-09-27: 4 mg via INTRAVENOUS
  Filled 2014-09-27: qty 2

## 2014-09-27 NOTE — Plan of Care (Signed)
Problem: Phase I Progression Outcomes Goal: Patient tolerating weaning plan Outcome: Not Progressing Was unable to wean today d/t low volumes per RT. Will attempt later this afternoon.  Goal: Voiding-avoid urinary catheter unless indicated Outcome: Not Met (add Reason) Patient receiving frequent dosing of IV Lasix

## 2014-09-27 NOTE — Progress Notes (Signed)
[place] Progress Note Patient Name: Debbie Pena DOB: 28-Dec-1945 MRN: [id number]   Date of Service  09/27/2014  HPI/Events of Note  Failing intermittent versed  eICU Interventions  proprofol     Intervention Category Minor Interventions: Agitation / anxiety - evaluation and management  Raylene Miyamoto. 09/27/2014, 5:12 PM

## 2014-09-27 NOTE — Progress Notes (Signed)
PULMONARY / CRITICAL CARE MEDICINE   Name: Debbie Pena MRN: [id number] DOB: Nov 18, 1946    ADMISSION DATE:  09/22/2014  CHIEF COMPLAINT:  Acute on chronic respiratory failure  INITIAL PRESENTATION: Pt with COPD and OSA/OHS, [facility] to [facility] from [facility] after hospitalization for VDRF in setting RLL PNA. Developed progressive resp failure 10/17 despite BiPAP, resulted in resp arrest and PEA < 10 minutes. Pt intubated, stabilized and transferred to [facility] ICU for further care.   STUDIES:  10/13 CT chest:  RLL infiltrate, B small effusions, L renal angiolipoma 10/18 CT head: ? Chronic R BG lacunar infarct vs dilated prevascular space. No evidence CVA or global anoxia  SIGNIFICANT EVENTS: 10/18 Minimally responsive. ?decorticate posturing. Persistent downward gaze.  10/19 Improving MS, following commands, nodding to questions.  10/20-22 Tolerates high PSV  SUBJECTIVE:  Has continued to tolerate PSV 14 Became more agitated last pm, sedation added  VITAL SIGNS: Temp:  [98.1 F (36.7 C)-99.5 F (37.5 C)] 99.4 F (37.4 C) (10/22 0749) Pulse Rate:  [85-139] 114 (10/22 0827) Resp:  [12-33] 21 (10/22 0827) BP: (93-132)/(50-80) 105/56 mmHg (10/22 0827) SpO2:  [97 %-100 %] 100 % (10/22 0830) FiO2 (%):  [40 %] 40 % (10/22 0830) Weight:  [77 kg (169 lb 12.1 oz)] 77 kg (169 lb 12.1 oz) (10/22 0500) HEMODYNAMICS: CVP:  [9 mmHg-15 mmHg] 9 mmHg VENTILATOR SETTINGS: Vent Mode:  [-] PRVC FiO2 (%):  [40 %] 40 % Set Rate:  [12 bmp] 12 bmp Vt Set:  [500 mL] 500 mL PEEP:  [5 cmH20] 5 cmH20 Pressure Support:  [14 cmH20] 14 cmH20 Plateau Pressure:  [29 cmH20-36 cmH20] 36 cmH20 INTAKE / OUTPUT:  Intake/Output Summary (Last 24 hours) at 09/27/14 5573 Last data filed at 09/27/14 0800  Gross per 24 hour  Intake 1854.25 ml  Output   3255 ml  Net -1400.75 ml    PHYSICAL EXAMINATION: General:  Obese intubated woman, NAD Neuro:  More awake, follow commands, somewhat weak  cough HEENT:  PERRL, OP clear, oral secretions have decreased Cardiovascular:  Reg, no M Lungs:  Clear B Abdomen:  Obese, soft, +BS Ext: warm, no edema  LABS:  Recent Labs Lab 09/22/14 2139 09/24/14 0420 09/26/14 0400  HGB 8.7* 8.7* 8.0*  HCT 30.8* 28.9* 26.2*  WBC 15.3* 8.6 9.4  PLT 213 204 191    Recent Labs Lab 09/22/14 2139 09/23/14 0415  09/24/14 2102 09/25/14 0438 09/25/14 1740 09/26/14 0400 09/27/14 0400  NA 143 145  < > 140 146 146 148* 140  K 3.7 3.0*  < > 3.5* 3.4* 4.4 3.4* 4.6  CL 100 103  < > 101 107 106 108 99  CO2 34* 33*  < > 30 31 32 32 33*  GLUCOSE 215* 106*  < > 158* 139* 120* 149* 136*  BUN 19 19  < > 13 13 16 16 20   CREATININE 0.49* 0.50  < > 0.39* 0.40* 0.39* 0.42* 0.37*  CALCIUM 8.3* 8.6  < > 8.6 8.7 9.2 8.9 9.0  MG 1.6 1.7  --   --   --   --  1.7 2.0  PHOS 2.0* <0.5*  --   --   --   --  2.7  --   < > = values in this interval not displayed.  Recent Labs Lab 09/22/14 1512 09/22/14 1913 09/22/14 2136 09/23/14 0425 09/23/14 0905  PHART 7.231* 7.277* 7.422 7.539* 7.461*  PCO2ART 94.9* 83.9* 51.5* 37.3 46.3*  PO2ART 113.0* 347.0* 121.0* 89.6  97.0  HCO3 38.4* 39.1* 33.5* 31.8* 32.9*  TCO2 41.4 42 35 32.9 34  O2SAT 98.3 100.0 99.0 99.1 98.0    Recent Labs Lab 09/22/14 2139  INR 1.16    Dg Chest Port 1 View  09/25/2014   CLINICAL DATA:  Respiratory failure, evaluate endotracheal tube position  EXAM: PORTABLE CHEST - 1 VIEW  COMPARISON:  Portable exam 1226 hr compared to 09/24/2014  FINDINGS: Tip of endotracheal tube projects 4.4 cm above carina.  Nasogastric tube extends into stomach.  Enlargement of cardiac silhouette with pulmonary vascular congestion.  Diffuse interstitial infiltrates the perihilar regions likely edema.  Bibasilar subsegmental atelectasis and small RIGHT pleural effusion.  No pneumothorax.  IMPRESSION: Persistent pulmonary edema with bibasilar atelectasis and small RIGHT pleural effusion.   Electronically Signed   By:  Lavonia Dana M.D.   On: 09/25/2014 12:43    ASSESSMENT / PLAN:  PULMONARY OETT 10/17 >>  A: Acute on chronic resp failure Respiratory arrest 10/17 COPD without current bronchospasm OSA/OHS, not on regular CPAP Diffuse pulm infiltrates - edema vs ALI Small R pleural effusion - appears improved on CXR 10/18 and 10/19 P:   Cont vent support as ordered.  Cont vent bundle, VAP prevention Daily SBT, attempt to decrease PS as she can tolerate Follow CXR Discussed with pt's daughter and nephew 10/21 our need to clarify her goals for care, particularly regarding tracheostomy or prolonged ventilation. They are talking to family about this. Pt should be able to participate herself once extubated  CARDIOVASCULAR CVL L femoral CVC 10/17 >> 10/20 A: PEA arrest, presumed primary respiratory arrest Shock post-CPR, resolved Sinus tachycardia, frequent PAC's vs A Fib P:  CVP goal 10-12 mmHg MAP goal 65 mmHg Metoprolol 12.5 bid Lasix 40mg  bid as she can tolerate  RENAL A:   Hypokalemia P:   Monitor BMET intermittently Monitor I/Os Correct electrolytes as indicated Scheduled KCl  GASTROINTESTINAL A:   Nutrition P:   SUP: enteral PPI Continue TFs  HEMATOLOGIC A:   No acute issues P:  DVT px: SQ heparin Monitor CBC intermittently Transfuse per usual ICU guidelines  INFECTIOUS A:   Suspect RLL CAP vs HCAP  P:   BCx2  10/17 >>  C diff 1020 >> negative  Abx:  Levaquin 10/9 >> 10/16 Azithro 10/17 >> 10/18 Vanco, 10/17 >> 10/19 Aztreonam 10/17 >> 10/19  Off abx since 10/19, following  ENDOCRINE A:   Hyperglycemia    P:   Cont CBGs/SSI  NEUROLOGIC A:   Severe encephalopathy post-arrest, improved significantly Abnormal neuro exam P:   Ct head 10/18 reassuring Minimize sedation/analgesia RASS goal 0   Family updated: 10/21 at bedside   Interdisciplinary Family Meeting:  None to date   TODAY'S SUMMARY:  I have personally obtained a history, examined the  patient, evaluated laboratory and imaging results, formulated the assessment and plan and placed orders.  CRITICAL CARE: The patient is critically ill with multiple organ systems failure and requires high complexity decision making for assessment and support, frequent evaluation and titration of therapies, application of advanced monitoring technologies and extensive interpretation of multiple databases. Critical Care Time devoted to patient care services by me described in this note is 35 minutes.   Baltazar Apo, MD, PhD 09/27/2014, 8:33 AM [facility] [phone] or if no answer [phone]

## 2014-09-28 ENCOUNTER — Inpatient Hospital Stay (HOSPITAL_COMMUNITY): Payer: Medicare Other

## 2014-09-28 LAB — BASIC METABOLIC PANEL
Anion gap: 13 (ref 5–15)
BUN: 19 mg/dL (ref 6–23)
CALCIUM: 8.9 mg/dL (ref 8.4–10.5)
CHLORIDE: 99 meq/L (ref 96–112)
CO2: 30 mEq/L (ref 19–32)
Creatinine, Ser: 0.51 mg/dL (ref 0.50–1.10)
GFR calc Af Amer: >90 mL/min (ref >90)
GFR calc non Af Amer: >90 mL/min (ref >90)
GLUCOSE: 155 mg/dL — AB (ref 70–99)
POTASSIUM: 2.9 meq/L — AB (ref 3.7–5.3)
SODIUM: 142 meq/L (ref 137–147)

## 2014-09-28 LAB — GLUCOSE, CAPILLARY
GLUCOSE-CAPILLARY: 140 mg/dL — AB (ref 70–99)
Glucose-Capillary: 171 mg/dL — ABNORMAL HIGH (ref 70–99)
Glucose-Capillary: 134 mg/dL — ABNORMAL HIGH (ref 70–99)
Glucose-Capillary: 141 mg/dL — ABNORMAL HIGH (ref 70–99)
Glucose-Capillary: 136 mg/dL — ABNORMAL HIGH (ref 70–99)

## 2014-09-28 LAB — PROCALCITONIN: Procalcitonin: <0.1 ng/mL

## 2014-09-28 MED ORDER — POTASSIUM CHLORIDE 10 MEQ/50ML IV SOLN
10.0000 meq | INTRAVENOUS | Status: AC
  Administered 2014-09-28 (×4): 10 meq via INTRAVENOUS
  Filled 2014-09-28 (×4): qty 50

## 2014-09-28 MED ORDER — VITAL HIGH PROTEIN PO LIQD
1000.0000 mL | ORAL | Status: DC
  Administered 2014-09-28: 1000 mL
  Administered 2014-09-29: 08:00:00
  Administered 2014-09-29 – 2014-09-30 (×2): 1000 mL
  Administered 2014-10-01: 08:00:00
  Filled 2014-09-28 (×4): qty 1000

## 2014-09-28 MED ORDER — PRO-STAT SUGAR FREE PO LIQD
60.0000 mL | Freq: Two times a day (BID) | ORAL | Status: DC
  Administered 2014-09-28 – 2014-10-01 (×6): 60 mL
  Filled 2014-09-28 (×7): qty 60

## 2014-09-28 MED ORDER — POTASSIUM CHLORIDE 20 MEQ/15ML (10%) PO LIQD
40.0000 meq | Freq: Two times a day (BID) | ORAL | Status: DC
  Administered 2014-09-28 – 2014-09-30 (×5): 40 meq
  Filled 2014-09-28 (×6): qty 30

## 2014-09-28 MED ORDER — VITAL HIGH PROTEIN PO LIQD
1000.0000 mL | ORAL | Status: DC
  Filled 2014-09-28: qty 1000

## 2014-09-28 NOTE — Progress Notes (Addendum)
NUTRITION FOLLOW UP   DOCUMENTATION CODES Per approved criteria  -Obesity Unspecified   INTERVENTION:  Change TF regimen to Vital HP formula at 15 ml/hr with Prostat liquid protein to 60 ml BID via tube to provide 760 kcals, 92 gm protein (100% of estimated protein needs), 301 ml of free water.  TF regimen + current Propofol infusion will meet ~ 25 kcals/kg/ideal body weight. RD to follow for nutrition care plan  NUTRITION DIAGNOSIS: Inadequate oral intake related to inability to eat as evidenced by NPO status, ongoing  Goal: Enteral nutrition to provide 60-70% of estimated calorie needs (22-25 kcals/kg ideal body weight) and 100% of estimated protein needs, based on ASPEN guidelines for hypocaloric, high protein feeding in critically ill obese individuals, met  Monitor:  TF regimen & tolerance, Propofol infusion, respiratory status, weight, labs, I/O's  ASSESSMENT: 68 y.o. Female with COPD and OSA/OHS, transferred to [facility] from [place] after hospitalization for VDRF in setting RLL PNA. Developed progressive resp failure 10/17 despite BiPAP, resulted in resp arrest and PEA < 10 minutes.   Patient is currently intubated on ventilator support MV: 9.7 L/min Temp (24hrs), Avg:99.7 F (37.6 C), Min:98.9 F (37.2 C), Max:100.5 F (38.1 C)   Propofol: 13.9 ml/hr -----> 367 fat kcals   Current TF regimen: Vital HP formula at 40 ml/hr via OGT with Prostat liquid protein 30 ml daily which is providing 1060 kcals (23 kcals/kg ideal body weight), 99 gm protein (100% of estimated protein needs), 803 ml of free water.  Free water flushes at 200 ml every 8 hours.  Tolerating well.  RD to adjust TF regimen now that patient is on Propofol.  Height: Ht Readings from Last 1 Encounters:  09/22/14 5' (1.524 m)    Weight: Wt Readings from Last 1 Encounters:  09/28/14 162 lb 14.7 oz (73.9 kg)    BMI:  Body mass index is 31.82 kg/(m^2).  Estimated Nutritional Needs: Kcal:  1606 Protein: 90-100 gm Fluid: per MD  Skin: Intact  Diet Order: NPO   Intake/Output Summary (Last 24 hours) at 09/28/14 1247 Last data filed at 09/28/14 1200  Gross per 24 hour  Intake 1675.17 ml  Output   2950 ml  Net -1274.83 ml    Labs:   Recent Labs Lab 09/22/14 2139 09/23/14 0415  09/26/14 0400 09/27/14 0400 09/28/14 0447  NA 143 145  < > 148* 140 142  K 3.7 3.0*  < > 3.4* 4.6 2.9*  CL 100 103  < > 108 99 99  CO2 34* 33*  < > 32 33* 30  BUN 19 19  < > 16 20 19  CREATININE 0.49* 0.50  < > 0.42* 0.37* 0.51  CALCIUM 8.3* 8.6  < > 8.9 9.0 8.9  MG 1.6 1.7  --  1.7 2.0  --   PHOS 2.0* <0.5*  --  2.7  --   --   GLUCOSE 215* 106*  < > 149* 136* 155*  < > = values in this interval not displayed.  CBG (last 3)   Recent Labs  09/28/14 0336 09/28/14 0743 09/28/14 1155  GLUCAP 171* 140* 134*    Scheduled Meds: . antiseptic oral rinse  7 mL Mouth Rinse QID  . chlorhexidine  15 mL Mouth Rinse BID  . feeding supplement (PRO-STAT SUGAR FREE 64)  30 mL Per Tube Q1200  . feeding supplement (VITAL HIGH PROTEIN)  1,000 mL Per Tube Q24H  . free water  300 mL Per Tube Q6H  .   furosemide  40 mg Intravenous BID  . heparin  5,000 Units Subcutaneous 3 times per day  . insulin aspart  0-15 Units Subcutaneous 6 times per day  . ipratropium-albuterol  3 mL Nebulization Q6H  . metoprolol tartrate  12.5 mg Oral BID  . pantoprazole sodium  40 mg Per Tube Q1200  . potassium chloride  40 mEq Per Tube BID  . scopolamine  1 patch Transdermal Q72H  . sodium chloride  500 mL Intravenous Once  . sodium chloride  10-40 mL Intracatheter Q12H    Continuous Infusions: . propofol 30 mcg/kg/min (09/28/14 0700)    Past Medical History  Diagnosis Date  . COPD (chronic obstructive pulmonary disease)   . Obesity hypoventilation syndrome   . OSA (obstructive sleep apnea)   . HTN (hypertension)   . Schizoaffective disorder   . GERD (gastroesophageal reflux disease)   . Chronic  respiratory failure with hypoxia   . Hyperlipidemia   . Renal angiolipoma   . Hepatic cyst     History reviewed. No pertinent past surgical history.  Arthur Holms, RD, LDN Pager #: [phone] After-Hours Pager #: [phone]

## 2014-09-28 NOTE — Progress Notes (Addendum)
PULMONARY / CRITICAL CARE MEDICINE   Name: Debbie Pena MRN: [id number] DOB: 1946-10-28    ADMISSION DATE:  09/22/2014  CHIEF COMPLAINT:  Acute on chronic respiratory failure  INITIAL PRESENTATION: Pt with COPD and OSA/OHS, [facility] after hospitalization for VDRF in setting RLL PNA. Developed progressive resp failure 10/17 despite BiPAP, resulted in resp arrest and PEA < 10 minutes. Pt intubated, stabilized and transferred to [facility] ICU for further care.   STUDIES:  10/13 CT chest:  RLL infiltrate, B small effusions, L renal angiolipoma 10/18 CT head: ? Chronic R BG lacunar infarct vs dilated prevascular space. No evidence CVA or global anoxia  SIGNIFICANT EVENTS: 10/18 Minimally responsive. ?decorticate posturing. Persistent downward gaze.  10/19 Improving MS, following commands, nodding to questions.  10/20-22 Tolerates high PSV  SUBJECTIVE:  Started on propofol overnight, has been decreased this am Again tolerating PSV 16, but Vt's in 100's if lowered. Scopolamine added for oral secretions.   VITAL SIGNS: Temp:  [98.9 F (37.2 C)-100.5 F (38.1 C)] 99.3 F (37.4 C) (10/23 0800) Pulse Rate:  [78-121] 101 (10/23 0800) Resp:  [11-34] 29 (10/23 0800) BP: (93-134)/(49-86) 111/60 mmHg (10/23 0800) SpO2:  [82 %-100 %] 100 % (10/23 0800) FiO2 (%):  [40 %] 40 % (10/23 0800) Weight:  [73.9 kg (162 lb 14.7 oz)] 73.9 kg (162 lb 14.7 oz) (10/23 0500) HEMODYNAMICS: CVP:  [7 mmHg-10 mmHg] 7 mmHg VENTILATOR SETTINGS: Vent Mode:  [-] PSV FiO2 (%):  [40 %] 40 % Set Rate:  [12 bmp] 12 bmp Vt Set:  [500 mL] 500 mL PEEP:  [5 cmH20] 5 cmH20 Pressure Support:  [16 cmH20] 16 cmH20 Plateau Pressure:  [26 cmH20-30 cmH20] 27 cmH20 INTAKE / OUTPUT:  Intake/Output Summary (Last 24 hours) at 09/28/14 6433 Last data filed at 09/28/14 0700  Gross per 24 hour  Intake 2175.17 ml  Output   2755 ml  Net -579.83 ml    PHYSICAL EXAMINATION: General:  Obese  intubated woman, NAD Neuro:  More awake, follow commands, somewhat weak cough HEENT:  PERRL, OP clear, oral secretions have decreased Cardiovascular:  Reg, no M Lungs:  Clear B Abdomen:  Obese, soft, +BS Ext: warm, no edema  LABS:  Recent Labs Lab 09/22/14 2139 09/24/14 0420 09/26/14 0400  HGB 8.7* 8.7* 8.0*  HCT 30.8* 28.9* 26.2*  WBC 15.3* 8.6 9.4  PLT 213 204 191    Recent Labs Lab 09/22/14 2139 09/23/14 0415  09/25/14 0438 09/25/14 1740 09/26/14 0400 09/27/14 0400 09/28/14 0447  NA 143 145  < > 146 146 148* 140 142  K 3.7 3.0*  < > 3.4* 4.4 3.4* 4.6 2.9*  CL 100 103  < > 107 106 108 99 99  CO2 34* 33*  < > 31 32 32 33* 30  GLUCOSE 215* 106*  < > 139* 120* 149* 136* 155*  BUN 19 19  < > 13 16 16 20 19   CREATININE 0.49* 0.50  < > 0.40* 0.39* 0.42* 0.37* 0.51  CALCIUM 8.3* 8.6  < > 8.7 9.2 8.9 9.0 8.9  MG 1.6 1.7  --   --   --  1.7 2.0  --   PHOS 2.0* <0.5*  --   --   --  2.7  --   --   < > = values in this interval not displayed.  Recent Labs Lab 09/22/14 1512 09/22/14 1913 09/22/14 2136 09/23/14 0425 09/23/14 0905  PHART 7.231* 7.277* 7.422 7.539* 7.461*  PCO2ART 94.9* 83.9* 51.5* 37.3 46.3*  PO2ART 113.0* 347.0* 121.0* 89.6 97.0  HCO3 38.4* 39.1* 33.5* 31.8* 32.9*  TCO2 41.4 42 35 32.9 34  O2SAT 98.3 100.0 99.0 99.1 98.0    Recent Labs Lab 09/22/14 2139  INR 1.16    Dg Chest Port 1 View  09/28/2014   CLINICAL DATA:  Acute respiratory failure.  Subsequent encounter.  EXAM: PORTABLE CHEST - 1 VIEW  COMPARISON:  09/25/2014.  FINDINGS: Support apparatus: Endotracheal tube tip 31 mm from the carina. LEFT upper extremity PICC is present with the tip in the mid SVC, just inferior to the level of the carina. Enteric tube is present, at least in the stomach. Monitoring leads project over the chest.  Cardiomediastinal Silhouette:  Enlarged, unchanged.  Lungs: Allowing for technical differences, no interval change in pulmonary aeration. Basilar predominant  airspace disease and atelectasis. No pneumothorax.  Effusions:  Small bilateral pleural effusions.  Other:  None.  IMPRESSION: 1. Support apparatus in good position. Interval placement of LEFT upper extremity PICC with the tip in the mid SVC. 2. No change in pulmonary aeration with basilar predominant airspace disease and atelectasis. Airspace disease is most compatible with pulmonary edema. 3. Small bilateral pleural effusions.   Electronically Signed   By: Dereck Ligas M.D.   On: 09/28/2014 08:00    ASSESSMENT / PLAN:  PULMONARY OETT 10/17 >>  A: Acute on chronic resp failure Respiratory arrest 10/17 COPD without current bronchospasm OSA/OHS, not on regular CPAP Diffuse pulm infiltrates - edema vs ALI Small R pleural effusion - improved P:   Cont vent support as ordered.  Cont vent bundle, VAP prevention Daily SBT, attempt to decrease PS as she can tolerate, may need to attempt extubation even with low Vt's. Consider extubation straight to BiPAP.  Discussed with pt's daughter and nephew 10/21, brother 10/22, our need to clarify her goals for care, particularly regarding tracheostomy or prolonged ventilation. They are talking to family about this. Pt should be able to participate herself once extubated  CARDIOVASCULAR CVL L femoral CVC 10/17 >> 10/20 A: PEA arrest, presumed primary respiratory arrest Shock post-CPR, resolved Sinus tachycardia, frequent PAC's vs A Fib P:  CVP goal 10-12 mmHg MAP goal 65 mmHg Metoprolol 12.5 bid Lasix 40mg  bid as she can tolerate (added 10/21)  RENAL A:   Hypokalemia P:   Monitor BMET intermittently Monitor I/Os Correct electrolytes as indicated Scheduled KCl  GASTROINTESTINAL A:   Nutrition P:   SUP: enteral PPI Continue TFs  HEMATOLOGIC A:   No acute issues P:  DVT px: SQ heparin Monitor CBC intermittently Transfuse per usual ICU guidelines  INFECTIOUS A:   Suspect RLL CAP vs HCAP  P:   BCx2  10/17 >>  C diff 1020 >>  negative  Abx:  Levaquin 10/9 >> 10/16 Azithro 10/17 >> 10/18 Vanco, 10/17 >> 10/19 Aztreonam 10/17 >> 10/19  Off abx since 10/19, following clinically. Note Pct remains low 10/23  ENDOCRINE A:   Hyperglycemia    P:   Cont CBGs/SSI  NEUROLOGIC A:   Severe encephalopathy post-arrest, improved significantly P:   Ct head 10/18 reassuring Propofol at lowest dose possible RASS goal 0   Family updated: 10/21 at bedside, 10/22 by phone   Interdisciplinary Family Meeting:  None to date   TODAY'S SUMMARY:  I have personally obtained a history, examined the patient, evaluated laboratory and imaging results, formulated the assessment and plan and placed orders.  CRITICAL CARE: The patient is critically  ill with multiple organ systems failure and requires high complexity decision making for assessment and support, frequent evaluation and titration of therapies, application of advanced monitoring technologies and extensive interpretation of multiple databases. Critical Care Time devoted to patient care services by me described in this note is 35 minutes.   Baltazar Apo, MD, PhD 09/28/2014, 9:37 AM [facility] [phone] or if no answer [phone]

## 2014-09-28 NOTE — Progress Notes (Signed)
CRITICAL VALUE ALERT  Critical value received:  Potassium 2.9   Date of notification:  09/28/14  Time of notification:  0559  Critical value read back:Yes.    Nurse who received alert:  Vivia Ewing  MD notified (1st page):  [email or web address]  Time of first page:  0602  MD notified (2nd page):  Time of second page:  Responding MD:  yacoub  Time MD responded:  [phone]

## 2014-09-29 ENCOUNTER — Inpatient Hospital Stay (HOSPITAL_COMMUNITY): Payer: Medicare Other

## 2014-09-29 LAB — BASIC METABOLIC PANEL
Anion gap: 12 (ref 5–15)
BUN: 26 mg/dL — ABNORMAL HIGH (ref 6–23)
CHLORIDE: 100 meq/L (ref 96–112)
CO2: 31 mEq/L (ref 19–32)
Calcium: 9.5 mg/dL (ref 8.4–10.5)
Creatinine, Ser: 0.58 mg/dL (ref 0.50–1.10)
Glucose, Bld: 137 mg/dL — ABNORMAL HIGH (ref 70–99)
POTASSIUM: 3.5 meq/L — AB (ref 3.7–5.3)
Sodium: 143 mEq/L (ref 137–147)

## 2014-09-29 LAB — GLUCOSE, CAPILLARY
GLUCOSE-CAPILLARY: 140 mg/dL — AB (ref 70–99)
Glucose-Capillary: 103 mg/dL — ABNORMAL HIGH (ref 70–99)
Glucose-Capillary: 104 mg/dL — ABNORMAL HIGH (ref 70–99)
Glucose-Capillary: 119 mg/dL — ABNORMAL HIGH (ref 70–99)
Glucose-Capillary: 147 mg/dL — ABNORMAL HIGH (ref 70–99)
Glucose-Capillary: 156 mg/dL — ABNORMAL HIGH (ref 70–99)

## 2014-09-29 LAB — CBC
HCT: 26.8 % — ABNORMAL LOW (ref 36.0–46.0)
Hemoglobin: 8.1 g/dL — ABNORMAL LOW (ref 12.0–15.0)
MCH: 24.5 pg — ABNORMAL LOW (ref 26.0–34.0)
MCHC: 30.2 g/dL (ref 30.0–36.0)
MCV: 81 fL (ref 78.0–100.0)
Platelets: 179 10*3/uL (ref 150–400)
RBC: 3.31 MIL/uL — ABNORMAL LOW (ref 3.87–5.11)
RDW: 17.1 % — AB (ref 11.5–15.5)
WBC: 11.8 10*3/uL — AB (ref 4.0–10.5)

## 2014-09-29 LAB — CULTURE, BLOOD (ROUTINE X 2)
CULTURE: NO GROWTH
Culture: NO GROWTH

## 2014-09-29 LAB — MAGNESIUM: Magnesium: 1.8 mg/dL (ref 1.5–2.5)

## 2014-09-29 LAB — PHOSPHORUS: Phosphorus: 3.6 mg/dL (ref 2.3–4.6)

## 2014-09-29 LAB — PROCALCITONIN: Procalcitonin: <0.1 ng/mL

## 2014-09-29 MED ORDER — POTASSIUM CHLORIDE 10 MEQ/50ML IV SOLN
10.0000 meq | INTRAVENOUS | Status: AC
  Administered 2014-09-29 (×2): 10 meq via INTRAVENOUS
  Filled 2014-09-29 (×2): qty 50

## 2014-09-29 MED ORDER — RISPERIDONE 2 MG PO TABS
2.0000 mg | ORAL_TABLET | Freq: Every day | ORAL | Status: DC
  Administered 2014-09-29 – 2014-09-30 (×2): 2 mg via ORAL
  Filled 2014-09-29 (×3): qty 1

## 2014-09-29 MED FILL — Medication: Qty: 1 | Status: AC

## 2014-09-29 NOTE — Progress Notes (Signed)
PULMONARY / CRITICAL CARE MEDICINE   Name: Debbie Pena MRN: [id number] DOB: [birth year]    ADMISSION DATE:  09/22/2014  CHIEF COMPLAINT:  Acute on chronic respiratory failure  INITIAL PRESENTATION: Pt with COPD and OSA/OHS, [facility] after hospitalization for VDRF in setting RLL PNA. Developed progressive resp failure 10/17 despite BiPAP, resulted in resp arrest and PEA < 10 minutes. Pt intubated, stabilized and transferred to [facility] ICU for further care.   STUDIES:  10/13 CT chest:  RLL infiltrate, B small effusions, L renal angiolipoma 10/18 CT head: ? Chronic R BG lacunar infarct vs dilated prevascular space. No evidence CVA or global anoxia  SIGNIFICANT EVENTS: 10/18 Minimally responsive. ?decorticate posturing. Persistent downward gaze.  10/19 Improving MS, following commands, nodding to questions.  10/20-22 Tolerates high PSV 10/22 failing versed int, started On propofol gtt  SUBJECTIVE:  Febrile overnight On propofol gtt Good uO with lasix  VITAL SIGNS: Temp:  [98.8 F (37.1 C)-101.1 F (38.4 C)] 99.4 F (37.4 C) (10/24 0750) Pulse Rate:  [94-115] 100 (10/24 0800) Resp:  [12-34] 17 (10/24 0800) BP: (81-132)/(39-89) 128/85 mmHg (10/24 0800) SpO2:  [97 %-100 %] 97 % (10/24 0800) FiO2 (%):  [40 %] 40 % (10/24 0750) Weight:  [71.4 kg (157 lb 6.5 oz)] 71.4 kg (157 lb 6.5 oz) (10/24 0600) HEMODYNAMICS: CVP:  [6 mmHg-7 mmHg] 6 mmHg VENTILATOR SETTINGS: Vent Mode:  [-] PSV FiO2 (%):  [40 %] 40 % Set Rate:  [12 bmp] 12 bmp Vt Set:  [500 mL] 500 mL PEEP:  [5 cmH20] 5 cmH20 Pressure Support:  [12 cmH20-16 cmH20] 12 cmH20 Plateau Pressure:  [22 RSW54-62 cmH20] 26 cmH20 INTAKE / OUTPUT:  Intake/Output Summary (Last 24 hours) at 09/29/14 0924 Last data filed at 09/29/14 0800  Gross per 24 hour  Intake 1650.77 ml  Output   3475 ml  Net -1824.23 ml    PHYSICAL EXAMINATION: General:  Obese intubated woman, NAD Neuro:  More awake,  follows commands, somewhat weak cough HEENT:  PERRL, OP clear, oral secretions have decreased Cardiovascular:  Reg, no M Lungs:  Clear B Abdomen:  Obese, soft, +BS Ext: warm, no edema  LABS:  Recent Labs Lab 09/24/14 0420 09/26/14 0400 09/29/14 0355  HGB 8.7* 8.0* 8.1*  HCT 28.9* 26.2* 26.8*  WBC 8.6 9.4 11.8*  PLT 204 191 179    Recent Labs Lab 09/22/14 2139 09/23/14 0415  09/25/14 1740 09/26/14 0400 09/27/14 0400 09/28/14 0447 09/29/14 0355  NA 143 145  < > 146 148* 140 142 143  K 3.7 3.0*  < > 4.4 3.4* 4.6 2.9* 3.5*  CL 100 103  < > 106 108 99 99 100  CO2 34* 33*  < > 32 32 33* 30 31  GLUCOSE 215* 106*  < > 120* 149* 136* 155* 137*  BUN 19 19  < > 16 16 20 19  26*  CREATININE 0.49* 0.50  < > 0.39* 0.42* 0.37* 0.51 0.58  CALCIUM 8.3* 8.6  < > 9.2 8.9 9.0 8.9 9.5  MG 1.6 1.7  --   --  1.7 2.0  --  1.8  PHOS 2.0* <0.5*  --   --  2.7  --   --  3.6  < > = values in this interval not displayed.  Recent Labs Lab 09/22/14 1512 09/22/14 1913 09/22/14 2136 09/23/14 0425 09/23/14 0905  PHART 7.231* 7.277* 7.422 7.539* 7.461*  PCO2ART 94.9* 83.9* 51.5* 37.3 46.3*  PO2ART 113.0* 347.0* 121.0*  89.6 97.0  HCO3 38.4* 39.1* 33.5* 31.8* 32.9*  TCO2 41.4 42 35 32.9 34  O2SAT 98.3 100.0 99.0 99.1 98.0    Recent Labs Lab 09/22/14 2139  INR 1.16    Dg Chest Port 1 View  09/29/2014   CLINICAL DATA:  Respiratory failure, ventilatory support, intubated  EXAM: PORTABLE CHEST - 1 VIEW  COMPARISON:  09/28/2014  FINDINGS: Exam is rotated to the right. Endotracheal tube 4 cm above the carina. Left PICC line tip proximal SVC. NG tube within the stomach.  Persistent cardiomegaly with improving bibasilar atelectasis and trace pleural effusions. Slightly better lung volumes. Upper lobes remain clear. No pneumothorax. Atherosclerosis of the ectatic aorta.  IMPRESSION: Cardiomegaly with improving bibasilar atelectasis and residual trace effusions.   Electronically Signed   By: Daryll Brod M.D.   On: 09/29/2014 08:21   Dg Chest Port 1 View  09/28/2014   CLINICAL DATA:  Acute respiratory failure.  Subsequent encounter.  EXAM: PORTABLE CHEST - 1 VIEW  COMPARISON:  09/25/2014.  FINDINGS: Support apparatus: Endotracheal tube tip 31 mm from the carina. LEFT upper extremity PICC is present with the tip in the mid SVC, just inferior to the level of the carina. Enteric tube is present, at least in the stomach. Monitoring leads project over the chest.  Cardiomediastinal Silhouette:  Enlarged, unchanged.  Lungs: Allowing for technical differences, no interval change in pulmonary aeration. Basilar predominant airspace disease and atelectasis. No pneumothorax.  Effusions:  Small bilateral pleural effusions.  Other:  None.  IMPRESSION: 1. Support apparatus in good position. Interval placement of LEFT upper extremity PICC with the tip in the mid SVC. 2. No change in pulmonary aeration with basilar predominant airspace disease and atelectasis. Airspace disease is most compatible with pulmonary edema. 3. Small bilateral pleural effusions.   Electronically Signed   By: Dereck Ligas M.D.   On: 09/28/2014 08:00    ASSESSMENT / PLAN:  PULMONARY OETT 10/17 >>  A: Acute on chronic resp failure Respiratory arrest 10/17 COPD without current bronchospasm OSA/OHS, not on regular CPAP Diffuse pulm infiltrates - edema vs ALI Small R pleural effusion - improved P:   Cont vent support as ordered.  Cont vent bundle, VAP prevention Daily SBT, on 12/5 this am, attempt to decrease PS as she can tolerate, plan is for extubation to BiPAP if she tolerates lower PS.    CARDIOVASCULAR CVL L femoral CVC 10/17 >> 10/20 A: PEA arrest, presumed primary respiratory arrest Shock post-CPR, resolved Sinus tachycardia, frequent PAC's vs A Fib P:  CVP goal 10-12 mmHg MAP goal 65 mmHg Metoprolol 12.5 bid Lasix 40mg  bid as she can tolerate (added 10/21)  RENAL A:   Hypokalemia P:   Monitor BMET  intermittently Monitor I/Os Correct electrolytes as indicated Scheduled KCl  GASTROINTESTINAL A:   Nutrition P:   SUP: enteral PPI Continue TFs  HEMATOLOGIC A:   No acute issues P:  DVT px: SQ heparin Monitor CBC intermittently Transfuse per usual ICU guidelines  INFECTIOUS A:   Suspect RLL CAP vs HCAP  P:   BCx2  10/17 >> ng C diff 1020 >> negative  Abx:  Levaquin 10/9 >> 10/16 Azithro 10/17 >> 10/18 Vanco, 10/17 >> 10/19 Aztreonam 10/17 >> 10/19  Off abx since 10/19, following clinically. Note Pct remains low 10/23  ENDOCRINE A:   Hyperglycemia    P:   Cont CBGs/SSI  NEUROLOGIC A:   Severe encephalopathy post-arrest, improved significantly Ct head 10/18 reassuring P:  Propofol at lowest dose possible-daily WUA RASS goal 0 Fent int    Family updated: 10/24 Discussed with family particularly regarding tracheostomy or prolonged ventilation. They would like a tstomy if absolutely needed. Pt should be able to participate herself once extubated   Interdisciplinary Family Meeting:  None to date   TODAY'S SUMMARY: ct weaning efforts with goal extubation to Bipap once she tolerates lower PS.  I have personally obtained a history, examined the patient, evaluated laboratory and imaging results, formulated the assessment and plan and placed orders.  CRITICAL CARE: The patient is critically ill with multiple organ systems failure and requires high complexity decision making for assessment and support, frequent evaluation and titration of therapies, application of advanced monitoring technologies and extensive interpretation of multiple databases. Critical Care Time devoted to patient care services by me described in this note is 35 minutes.   Kara Mead MD. Shade Flood. [place] [facility] Pager [phone] If no response call [phone]    09/29/2014, 9:24 AM

## 2014-09-30 ENCOUNTER — Inpatient Hospital Stay (HOSPITAL_COMMUNITY): Payer: Medicare Other

## 2014-09-30 LAB — CBC
HEMATOCRIT: 28.3 % — AB (ref 36.0–46.0)
Hemoglobin: 9.5 g/dL — ABNORMAL LOW (ref 12.0–15.0)
MCH: 30.7 pg (ref 26.0–34.0)
MCHC: 33.6 g/dL (ref 30.0–36.0)
MCV: 91.6 fL (ref 78.0–100.0)
Platelets: 114 10*3/uL — ABNORMAL LOW (ref 150–400)
RBC: 3.09 MIL/uL — ABNORMAL LOW (ref 3.87–5.11)
RDW: 13.8 % (ref 11.5–15.5)
WBC: 11.9 10*3/uL — ABNORMAL HIGH (ref 4.0–10.5)

## 2014-09-30 LAB — BASIC METABOLIC PANEL
ANION GAP: 11 (ref 5–15)
Anion gap: 11 (ref 5–15)
BUN: 30 mg/dL — ABNORMAL HIGH (ref 6–23)
BUN: 33 mg/dL — AB (ref 6–23)
CALCIUM: 9.6 mg/dL (ref 8.4–10.5)
CHLORIDE: 104 meq/L (ref 96–112)
CO2: 24 meq/L (ref 19–32)
CO2: 32 mEq/L (ref 19–32)
CREATININE: 1.61 mg/dL — AB (ref 0.50–1.10)
Calcium: 8.2 mg/dL — ABNORMAL LOW (ref 8.4–10.5)
Chloride: 100 mEq/L (ref 96–112)
Creatinine, Ser: 0.57 mg/dL (ref 0.50–1.10)
GFR calc Af Amer: 37 mL/min — ABNORMAL LOW (ref >90)
GFR calc Af Amer: >90 mL/min (ref >90)
GFR calc non Af Amer: 32 mL/min — ABNORMAL LOW (ref >90)
GLUCOSE: 137 mg/dL — AB (ref 70–99)
Glucose, Bld: 142 mg/dL — ABNORMAL HIGH (ref 70–99)
Potassium: 4.6 mEq/L (ref 3.7–5.3)
Potassium: 4.4 mEq/L (ref 3.7–5.3)
SODIUM: 143 meq/L (ref 137–147)
Sodium: 139 mEq/L (ref 137–147)

## 2014-09-30 LAB — TRIGLYCERIDES: Triglycerides: 169 mg/dL — ABNORMAL HIGH (ref <150)

## 2014-09-30 LAB — GLUCOSE, CAPILLARY
GLUCOSE-CAPILLARY: 120 mg/dL — AB (ref 70–99)
GLUCOSE-CAPILLARY: 119 mg/dL — AB (ref 70–99)
GLUCOSE-CAPILLARY: 134 mg/dL — AB (ref 70–99)
Glucose-Capillary: 131 mg/dL — ABNORMAL HIGH (ref 70–99)
Glucose-Capillary: 137 mg/dL — ABNORMAL HIGH (ref 70–99)
Glucose-Capillary: 138 mg/dL — ABNORMAL HIGH (ref 70–99)

## 2014-09-30 NOTE — Progress Notes (Signed)
ABG result called to Dr. Elsworth Soho. No extubation today. Continue to wean on CP/PS as tolerated today.

## 2014-09-30 NOTE — Progress Notes (Addendum)
PULMONARY / CRITICAL CARE MEDICINE   Name: Debbie Pena MRN: [id number] DOB: 1946-10-18    ADMISSION DATE:  09/22/2014  CHIEF COMPLAINT:  Acute on chronic respiratory failure  INITIAL PRESENTATION: Pt with COPD and OSA/OHS, [facility] after hospitalization for VDRF in setting RLL PNA. Developed progressive resp failure 10/17 despite BiPAP, resulted in resp arrest and PEA < 10 minutes. Pt intubated, stabilized and transferred to [facility] ICU for further care.   STUDIES:  10/13 CT chest:  RLL infiltrate, B small effusions, L renal angiolipoma 10/18 CT head: ? Chronic R BG lacunar infarct vs dilated prevascular space. No evidence CVA or global anoxia  SIGNIFICANT EVENTS: 10/18 Minimally responsive. ?decorticate posturing. Persistent downward gaze.  10/19 Improving MS, following commands, nodding to questions.  10/20-22 Tolerates high PSV 10/22 failing versed int, started On propofol gtt  SUBJECTIVE:  AFebrile last 24h Awake, RASS 0 On propofol gtt Good uO with lasix   VITAL SIGNS: Temp:  [98.2 F (36.8 C)-99.2 F (37.3 C)] 99 F (37.2 C) (10/25 0820) Pulse Rate:  [99-116] 103 (10/25 1100) Resp:  [11-32] 18 (10/25 1100) BP: (87-133)/(40-92) 107/66 mmHg (10/25 1100) SpO2:  [100 %] 100 % (10/25 1100) FiO2 (%):  [40 %] 40 % (10/25 0936) Weight:  [71.6 kg (157 lb 13.6 oz)] 71.6 kg (157 lb 13.6 oz) (10/25 0500) HEMODYNAMICS: CVP:  [7 mmHg-9 mmHg] 7 mmHg VENTILATOR SETTINGS: Vent Mode:  [-] PRVC FiO2 (%):  [40 %] 40 % Set Rate:  [12 bmp] 12 bmp Vt Set:  [500 mL] 500 mL PEEP:  [5 cmH20] 5 cmH20 Pressure Support:  [12 cmH20] 12 cmH20 Plateau Pressure:  [21 cmH20-27 cmH20] 27 cmH20 INTAKE / OUTPUT:  Intake/Output Summary (Last 24 hours) at 09/30/14 1205 Last data filed at 09/30/14 1100  Gross per 24 hour  Intake 2178.4 ml  Output   2185 ml  Net   -6.6 ml    PHYSICAL EXAMINATION: General:  Obese intubated woman, NAD Neuro:  More awake,  follows commands, somewhat weak cough HEENT:  PERRL, OP clear, minimal oral secretions  Cardiovascular:  Reg, no M Lungs:  Clear B Abdomen:  Obese, soft, +BS Ext: warm, no edema  LABS:  Recent Labs Lab 09/26/14 0400 09/29/14 0355 09/30/14 0400  HGB 8.0* 8.1* 9.5*  HCT 26.2* 26.8* 28.3*  WBC 9.4 11.8* 11.9*  PLT 191 179 114*    Recent Labs Lab 09/26/14 0400 09/27/14 0400 09/28/14 0447 09/29/14 0355 09/30/14 0400  NA 148* 140 142 143 139  K 3.4* 4.6 2.9* 3.5* 4.6  CL 108 99 99 100 104  CO2 32 33* 30 31 24   GLUCOSE 149* 136* 155* 137* 137*  BUN 16 20 19  26* 30*  CREATININE 0.42* 0.37* 0.51 0.58 1.61*  CALCIUM 8.9 9.0 8.9 9.5 8.2*  MG 1.7 2.0  --  1.8  --   PHOS 2.7  --   --  3.6  --    No results found for this basename: PHART, PCO2, PCO2ART, PO2, PO2ART, HCO3, TCO2, O2SAT,  in the last 168 hours No results found for this basename: INR,  in the last 168 hours  Dg Chest Port 1 View  09/30/2014   CLINICAL DATA:  68 year old female with acute respiratory failure.  EXAM: PORTABLE CHEST - 1 VIEW  COMPARISON:  Chest x-ray 09/29/2014.  FINDINGS: An endotracheal tube is in place with tip 3.3 cm above the carina. There is a cleft upper extremity PICC with tip terminating in  the superior cavoatrial junction. A nasogastric tube is seen extending into the stomach, however, the tip of the nasogastric tube extends below the lower margin of the image. Lung volumes are low, and there are bibasilar opacities which may reflect areas of atelectasis and/or consolidation. No definite pleural effusions. No evidence of pulmonary edema. Mild cardiomegaly. Upper mediastinal contours are distorted by low lung volumes, lordotic positioning and mild patient rotation.  IMPRESSION: 1. Support apparatus, as above. 2. Low lung volumes with persistent bibasilar atelectasis and/or consolidation.   Electronically Signed   By: Vinnie Langton M.D.   On: 09/30/2014 08:12   Dg Chest Port 1 View  09/29/2014    CLINICAL DATA:  Respiratory failure, ventilatory support, intubated  EXAM: PORTABLE CHEST - 1 VIEW  COMPARISON:  09/28/2014  FINDINGS: Exam is rotated to the right. Endotracheal tube 4 cm above the carina. Left PICC line tip proximal SVC. NG tube within the stomach.  Persistent cardiomegaly with improving bibasilar atelectasis and trace pleural effusions. Slightly better lung volumes. Upper lobes remain clear. No pneumothorax. Atherosclerosis of the ectatic aorta.  IMPRESSION: Cardiomegaly with improving bibasilar atelectasis and residual trace effusions.   Electronically Signed   By: Daryll Brod M.D.   On: 09/29/2014 08:21    ASSESSMENT / PLAN:  PULMONARY OETT 10/17 >>  A: Acute on chronic resp failure Respiratory arrest 10/17 Never smoker , so doubt COPD , no bronchospasm OSA/OHS, not on regular CPAP Diffuse pulm infiltrates - edema vs ALI Small R pleural effusion - improved P:    Cont vent bundle, VAP prevention Daily SBT, on 10/5 this am, very low volumes , doubt she will tolerate lower PS   CARDIOVASCULAR CVL L femoral CVC 10/17 >> 10/20 A: PEA arrest, presumed primary respiratory arrest Shock post-CPR, resolved Sinus tachycardia, frequent PAC's vs A Fib P:  CVP at goal-appears dry MAP goal 65 mmHg Metoprolol 12.5 bid   RENAL A:  AKI ? overdiuresis Hypokalemia -resolved P:   Monitor BMET intermittently Monitor I/Os Correct electrolytes as indicated hold lasix & rpt   GASTROINTESTINAL A:   Nutrition P:   SUP: enteral PPI Continue TFs  HEMATOLOGIC A:   No acute issues P:  DVT px: SQ heparin Monitor CBC intermittently Transfuse per usual ICU guidelines  INFECTIOUS A:   Suspect RLL CAP vs HCAP  P:   BCx2  10/17 >> ng C diff 1020 >> negative  Abx:  Levaquin 10/9 >> 10/16 Azithro 10/17 >> 10/18 Vanco, 10/17 >> 10/19 Aztreonam 10/17 >> 10/19  Off abx since 10/19, following clinically. Note Pct remains low 10/23  ENDOCRINE A:   Hyperglycemia     P:   Cont CBGs/SSI  NEUROLOGIC A:   Severe encephalopathy post-arrest, improved significantly Ct head 10/18 reassuring P:    Propofol at lowest dose possible-daily WUA RASS goal 0 Fent int    Family updated: 10/24, 10/25 Discussed with family particularly regarding tracheostomy or prolonged ventilation. They would like a tstomy if absolutely needed. Pt agreeable    Interdisciplinary Family Meeting:  None to date   TODAY'S SUMMARY: ct weaning efforts , if fails over next 24-48h, would proceed with tstomy ,Note that she has probable OSA & did not tolerate PAP,also resp-->cardiac arrest on bipap pt agreeable Sudden rise in cr ? False labs vs overdiuresis -hold lasix & rpt  I have personally obtained a history, examined the patient, evaluated laboratory and imaging results, formulated the assessment and plan and placed orders.  CRITICAL CARE: The patient  is critically ill with multiple organ systems failure and requires high complexity decision making for assessment and support, frequent evaluation and titration of therapies, application of advanced monitoring technologies and extensive interpretation of multiple databases. Critical Care Time devoted to patient care services by me described in this note is 35 minutes.   Kara Mead MD. Shade Flood. [place] [facility] Pager [phone] If no response call [phone]    09/30/2014, 12:05 PM

## 2014-10-01 ENCOUNTER — Inpatient Hospital Stay (HOSPITAL_COMMUNITY): Payer: Medicare Other

## 2014-10-01 LAB — POCT I-STAT 3, ART BLOOD GAS (G3+)
Acid-Base Excess: 8 mmol/L — ABNORMAL HIGH (ref 0.0–2.0)
Bicarbonate: 35 mEq/L — ABNORMAL HIGH (ref 20.0–24.0)
O2 Saturation: 99 %
PCO2 ART: 60.3 mmHg — AB (ref 35.0–45.0)
PH ART: 7.37 (ref 7.350–7.450)
Patient temperature: 98
TCO2: 37 mmol/L (ref 0–100)
pO2, Arterial: 121 mmHg — ABNORMAL HIGH (ref 80.0–100.0)

## 2014-10-01 LAB — GLUCOSE, CAPILLARY
GLUCOSE-CAPILLARY: 132 mg/dL — AB (ref 70–99)
GLUCOSE-CAPILLARY: 173 mg/dL — AB (ref 70–99)
Glucose-Capillary: 134 mg/dL — ABNORMAL HIGH (ref 70–99)
Glucose-Capillary: 117 mg/dL — ABNORMAL HIGH (ref 70–99)
Glucose-Capillary: 164 mg/dL — ABNORMAL HIGH (ref 70–99)
Glucose-Capillary: 142 mg/dL — ABNORMAL HIGH (ref 70–99)

## 2014-10-01 LAB — BASIC METABOLIC PANEL
Anion gap: 14 (ref 5–15)
BUN: 33 mg/dL — AB (ref 6–23)
CHLORIDE: 99 meq/L (ref 96–112)
CO2: 28 mEq/L (ref 19–32)
Calcium: 9.5 mg/dL (ref 8.4–10.5)
Creatinine, Ser: 0.56 mg/dL (ref 0.50–1.10)
GFR calc Af Amer: >90 mL/min (ref >90)
GFR calc non Af Amer: >90 mL/min (ref >90)
Glucose, Bld: 136 mg/dL — ABNORMAL HIGH (ref 70–99)
POTASSIUM: 3.4 meq/L — AB (ref 3.7–5.3)
Sodium: 141 mEq/L (ref 137–147)

## 2014-10-01 LAB — CBC
HCT: 28 % — ABNORMAL LOW (ref 36.0–46.0)
Hemoglobin: 8.6 g/dL — ABNORMAL LOW (ref 12.0–15.0)
MCH: 24.6 pg — ABNORMAL LOW (ref 26.0–34.0)
MCHC: 30.7 g/dL (ref 30.0–36.0)
MCV: 80.2 fL (ref 78.0–100.0)
Platelets: 232 10*3/uL (ref 150–400)
RBC: 3.49 MIL/uL — ABNORMAL LOW (ref 3.87–5.11)
RDW: 16.3 % — AB (ref 11.5–15.5)
WBC: 8.6 10*3/uL (ref 4.0–10.5)

## 2014-10-01 MED ORDER — ALBUTEROL SULFATE (2.5 MG/3ML) 0.083% IN NEBU
2.5000 mg | INHALATION_SOLUTION | RESPIRATORY_TRACT | Status: DC | PRN
  Administered 2014-10-01: 2.5 mg via RESPIRATORY_TRACT
  Filled 2014-10-01: qty 3

## 2014-10-01 MED ORDER — KCL IN DEXTROSE-NACL 40-5-0.45 MEQ/L-%-% IV SOLN
INTRAVENOUS | Status: DC
  Administered 2014-10-01: 12:00:00 via INTRAVENOUS
  Administered 2014-10-02: 50 mL/h via INTRAVENOUS
  Administered 2014-10-03 – 2014-10-04 (×2): via INTRAVENOUS
  Filled 2014-10-01 (×7): qty 1000

## 2014-10-01 MED ORDER — IPRATROPIUM BROMIDE 0.02 % IN SOLN: 0.5000 mg | Freq: Four times a day (QID) | RESPIRATORY_TRACT | Status: DC

## 2014-10-01 MED ORDER — POTASSIUM CHLORIDE 20 MEQ/15ML (10%) PO LIQD
20.0000 meq | ORAL | Status: AC
  Administered 2014-10-01: 20 meq
  Filled 2014-10-01: qty 15

## 2014-10-01 MED ORDER — IPRATROPIUM-ALBUTEROL 0.5-2.5 (3) MG/3ML IN SOLN
3.0000 mL | RESPIRATORY_TRACT | Status: DC
  Administered 2014-10-01 – 2014-10-02 (×4): 3 mL via RESPIRATORY_TRACT
  Filled 2014-10-01 (×4): qty 3

## 2014-10-01 MED ORDER — HALOPERIDOL LACTATE 5 MG/ML IJ SOLN
1.0000 mg | INTRAMUSCULAR | Status: DC | PRN
  Administered 2014-10-01: 2 mg via INTRAVENOUS
  Administered 2014-10-02: 4 mg via INTRAVENOUS
  Filled 2014-10-01 (×2): qty 1

## 2014-10-01 MED ORDER — CHLORHEXIDINE GLUCONATE 0.12 % MT SOLN
15.0000 mL | Freq: Two times a day (BID) | OROMUCOSAL | Status: DC
  Administered 2014-10-02 – 2014-10-11 (×18): 15 mL via OROMUCOSAL
  Filled 2014-10-01 (×18): qty 15

## 2014-10-01 MED ORDER — CETYLPYRIDINIUM CHLORIDE 0.05 % MT LIQD
7.0000 mL | Freq: Two times a day (BID) | OROMUCOSAL | Status: DC
  Administered 2014-10-02 – 2014-10-06 (×9): 7 mL via OROMUCOSAL

## 2014-10-01 MED ORDER — LEVALBUTEROL HCL 0.63 MG/3ML IN NEBU
0.6300 mg | INHALATION_SOLUTION | Freq: Four times a day (QID) | RESPIRATORY_TRACT | Status: DC
Start: 2014-10-01 — End: 2014-10-01

## 2014-10-01 NOTE — Progress Notes (Signed)
PULMONARY / CRITICAL CARE MEDICINE   Name: Debbie Pena MRN: [id number] DOB: 09/10/1946    ADMISSION DATE:  09/22/2014  CHIEF COMPLAINT:  Acute on chronic respiratory failure  INITIAL PRESENTATION: Pt with COPD and OSA/OHS, [facility] after hospitalization for VDRF in setting RLL PNA. Developed progressive resp failure 10/17 despite BiPAP, resulted in resp arrest and PEA < 10 minutes. Pt intubated, stabilized and transferred to [facility] ICU for further care.   STUDIES:  10/13 CT chest:  RLL infiltrate, B small effusions, L renal angiolipoma 10/18 CT head: ? Chronic R BG lacunar infarct vs dilated prevascular space. No evidence CVA or global anoxia  SIGNIFICANT EVENTS: 10/18 Minimally responsive. ?decorticate posturing. Persistent downward gaze.  10/19 Improving MS, following commands, nodding to questions.  10/20-22 Tolerates high PSV 10/22 failing versed int, started On propofol gtt  SUBJECTIVE:  Passed SBT but marginal Vt. Extubated and looks good  VITAL SIGNS: Temp:  [98.4 F (36.9 C)-99.5 F (37.5 C)] 98.6 F (37 C) (10/26 1551) Pulse Rate:  [86-133] 131 (10/26 1700) Resp:  [12-33] 23 (10/26 1700) BP: (86-154)/(51-74) 133/73 mmHg (10/26 1700) SpO2:  [100 %] 100 % (10/26 1700) FiO2 (%):  [40 %] 40 % (10/26 0812) Weight:  [70.4 kg (155 lb 3.3 oz)] 70.4 kg (155 lb 3.3 oz) (10/26 0600) HEMODYNAMICS: CVP:  [8 mmHg-12 mmHg] 12 mmHg VENTILATOR SETTINGS: Vent Mode:  [-] PSV;CPAP FiO2 (%):  [40 %] 40 % Set Rate:  [12 bmp] 12 bmp Vt Set:  [500 mL] 500 mL PEEP:  [5 cmH20] 5 cmH20 Pressure Support:  [10 cmH20] 10 cmH20 Plateau Pressure:  [24 cmH20-27 cmH20] 24 cmH20 INTAKE / OUTPUT:  Intake/Output Summary (Last 24 hours) at 10/01/14 1723 Last data filed at 10/01/14 1700  Gross per 24 hour  Intake 1935.8 ml  Output   1245 ml  Net  690.8 ml    PHYSICAL EXAMINATION: General:  Obese intubated woman, NAD Neuro:  More awake, follows commands,  somewhat weak cough HEENT:  PERRL, OP clear, minimal oral secretions  Cardiovascular:  Reg, no M Lungs:  Clear B Abdomen:  Obese, soft, +BS Ext: warm, no edema  LABS: I have reviewed all of today's lab results. Relevant abnormalities are discussed in the A/P section  ASSESSMENT / PLAN:  PULMONARY OETT 10/17 >>  A: Acute on chronic resp failure Respiratory arrest 10/17 OSA/OHS, not on regular CPAP Diffuse pulm infiltrates, resolved Small R pleural effusion, resolved P:   Supp O2 Monitor in ICU psot extubation  CARDIOVASCULAR CVL L femoral CVC 10/17 >> 10/20 A: PEA arrest 10/17 Shock post-CPR, resolved Sinus tachycardia, mild P:  Cont metoprolol MAP goal 60 mmHg   RENAL A:  AKI ? overdiuresis Hypokalemia -resolved P:   Monitor BMET intermittently Monitor I/Os Correct electrolytes as indicated hold lasix & rpt   GASTROINTESTINAL A:   Nutrition P:   SUP: enteral PPI Continue TFs  HEMATOLOGIC A:   No acute issues P:  DVT px: SQ heparin Monitor CBC intermittently Transfuse per usual ICU guidelines  INFECTIOUS A:   Suspect RLL CAP vs HCAP  P:   BCx2  10/17 >> ng C diff 1020 >> negative  Abx:  Levaquin 10/9 >> 10/16 Azithro 10/17 >> 10/18 Vanco, 10/17 >> 10/19 Aztreonam 10/17 >> 10/19  ENDOCRINE A:   Hyperglycemia    P:   Cont CBGs/SSI  NEUROLOGIC A:   Severe encephalopathy post-arrest, resolved P:   RASS goal 0 Minimize sedative/opioid analgesics post  extubation    Family updated:    Interdisciplinary Family Meeting:      TODAY'S SUMMARY:    I have personally obtained a history, examined the patient, evaluated laboratory and imaging results, formulated the assessment and plan and placed orders.  CRITICAL CARE: The patient is critically ill with multiple organ systems failure and requires high complexity decision making for assessment and support, frequent evaluation and titration of therapies, application of advanced  monitoring technologies and extensive interpretation of multiple databases. Critical Care Time devoted to patient care services by me described in this note is 40 minutes.   Merton Border, MD ; [facility] [phone].  After 5:30 PM or weekends, call [phone]   10/01/2014, 5:23 PM

## 2014-10-01 NOTE — Plan of Care (Signed)
Problem: Phase II Progression Outcomes Goal: Time pt extubated/weaned off vent Outcome: Completed/Met Date Met:  10/01/14 Extubated on 10/01/14 at 10:59 am

## 2014-10-01 NOTE — Progress Notes (Signed)
[facility] ADULT ICU REPLACEMENT PROTOCOL FOR AM LAB REPLACEMENT ONLY  The patient does apply for the [facility] Adult ICU Electrolyte Replacment Protocol based on the criteria listed below:   1. Is GFR >/= 40 ml/min? Yes.    Patient's GFR today is >90 2. Is urine output >/= 0.5 ml/kg/hr for the last 6 hours? Yes.   Patient's UOP is 0.9 ml/kg/hr 3. Is BUN < 60 mg/dL? Yes.    Patient's BUN today is 33 4. Abnormal electrolyte(s): K3.4 5. Ordered repletion with:Kcl 6. If a panic level lab has been reported, has the CCM MD in charge been notified? Yes.  .   Physician:  Dalbert Garnet 10/01/2014 6:35 AM

## 2014-10-01 NOTE — Progress Notes (Signed)
[place] Progress Note Patient Name: Debbie Pena DOB: [birth year] MRN: [id number]   Date of Service  10/01/2014  HPI/Events of Note  Anxious and mildly confused with mild tachypnea  eICU Interventions  Low dose PRN Haloperidol     Intervention Category Major Interventions: Change in mental status - evaluation and management  Merton Border 10/01/2014, 10:11 PM

## 2014-10-01 NOTE — Plan of Care (Signed)
Problem: Phase II Progression Outcomes Goal: Date pt extubated/weaned off vent Outcome: Completed/Met Date Met:  10/01/14 10/01/14 Goal: Progress activities as ordered Outcome: Completed/Met Date Met:  10/01/14 Patient has dangled on the side of the bed and is currently sitting in cardiac chair position Goal: Code status re-addressed Outcome: Completed/Met Date Met:  10/01/14 0 c/o pain or discomfort

## 2014-10-01 NOTE — Progress Notes (Signed)
Spoke to Dr. Alva Garnet, pt to remain NPO until possible speech eval tomorrow.   Denice Bors, RN

## 2014-10-01 NOTE — Procedures (Signed)
Extubation Procedure Note  Patient Details:   Name: Debbie Pena DOB: [birth year] MRN: [id number]   Airway Documentation:     Evaluation  O2 sats: stable throughout Complications: No apparent complications Patient did tolerate procedure well. Bilateral Breath Sounds: Clear;Diminished Suctioning: Oral;Airway Yes 4l/min [place] IS- unable to coordinate well, continue to work with patient. Vocalizes well, with slight hoarseness.Revonda Standard 10/01/2014, 11:00 AM

## 2014-10-02 ENCOUNTER — Inpatient Hospital Stay (HOSPITAL_COMMUNITY): Payer: Medicare Other

## 2014-10-02 DIAGNOSIS — R410 Disorientation, unspecified: Secondary | ICD-10-CM

## 2014-10-02 LAB — COMPREHENSIVE METABOLIC PANEL
ALT: 16 U/L (ref 0–35)
ANION GAP: 10 (ref 5–15)
AST: 12 U/L (ref 0–37)
Albumin: 3 g/dL — ABNORMAL LOW (ref 3.5–5.2)
Alkaline Phosphatase: 73 U/L (ref 39–117)
BUN: 18 mg/dL (ref 6–23)
CALCIUM: 9.8 mg/dL (ref 8.4–10.5)
CO2: 30 meq/L (ref 19–32)
Chloride: 102 mEq/L (ref 96–112)
Creatinine, Ser: 0.36 mg/dL — ABNORMAL LOW (ref 0.50–1.10)
GFR calc Af Amer: >90 mL/min (ref >90)
Glucose, Bld: 189 mg/dL — ABNORMAL HIGH (ref 70–99)
Potassium: 4.2 mEq/L (ref 3.7–5.3)
SODIUM: 142 meq/L (ref 137–147)
Total Bilirubin: <0.2 mg/dL — ABNORMAL LOW (ref 0.3–1.2)
Total Protein: 7.1 g/dL (ref 6.0–8.3)

## 2014-10-02 LAB — CBC
HEMATOCRIT: 31.3 % — AB (ref 36.0–46.0)
Hemoglobin: 9 g/dL — ABNORMAL LOW (ref 12.0–15.0)
MCH: 23.9 pg — AB (ref 26.0–34.0)
MCHC: 28.8 g/dL — AB (ref 30.0–36.0)
MCV: 83.2 fL (ref 78.0–100.0)
Platelets: 229 10*3/uL (ref 150–400)
RBC: 3.76 MIL/uL — ABNORMAL LOW (ref 3.87–5.11)
RDW: 15.4 % (ref 11.5–15.5)
WBC: 12.1 10*3/uL — AB (ref 4.0–10.5)

## 2014-10-02 LAB — GLUCOSE, CAPILLARY
GLUCOSE-CAPILLARY: 124 mg/dL — AB (ref 70–99)
GLUCOSE-CAPILLARY: 129 mg/dL — AB (ref 70–99)
GLUCOSE-CAPILLARY: 139 mg/dL — AB (ref 70–99)
GLUCOSE-CAPILLARY: 154 mg/dL — AB (ref 70–99)
Glucose-Capillary: 126 mg/dL — ABNORMAL HIGH (ref 70–99)
Glucose-Capillary: 161 mg/dL — ABNORMAL HIGH (ref 70–99)
Glucose-Capillary: 184 mg/dL — ABNORMAL HIGH (ref 70–99)

## 2014-10-02 MED ORDER — ENOXAPARIN SODIUM 40 MG/0.4ML ~~LOC~~ SOLN
40.0000 mg | SUBCUTANEOUS | Status: DC
Start: 2014-10-02 — End: 2014-10-11
  Administered 2014-10-02 – 2014-10-10 (×9): 40 mg via SUBCUTANEOUS
  Filled 2014-10-02 (×11): qty 0.4

## 2014-10-02 MED ORDER — HALOPERIDOL LACTATE 5 MG/ML IJ SOLN
10.0000 mg | Freq: Once | INTRAMUSCULAR | Status: AC
  Administered 2014-10-02: 10 mg via INTRAVENOUS
  Filled 2014-10-02: qty 2

## 2014-10-02 MED ORDER — PANTOPRAZOLE SODIUM 40 MG PO TBEC
40.0000 mg | DELAYED_RELEASE_TABLET | Freq: Every day | ORAL | Status: DC
  Administered 2014-10-04 – 2014-10-06 (×3): 40 mg via ORAL
  Filled 2014-10-02 (×4): qty 1

## 2014-10-02 MED ORDER — IPRATROPIUM-ALBUTEROL 0.5-2.5 (3) MG/3ML IN SOLN: 3.0000 mL | Freq: Four times a day (QID) | RESPIRATORY_TRACT | Status: DC

## 2014-10-02 MED ORDER — ASPIRIN 81 MG PO CHEW
81.0000 mg | CHEWABLE_TABLET | Freq: Every day | ORAL | Status: DC
  Administered 2014-10-04 – 2014-10-11 (×8): 81 mg via ORAL
  Filled 2014-10-02 (×9): qty 1

## 2014-10-02 MED ORDER — HALOPERIDOL LACTATE 5 MG/ML IJ SOLN
4.0000 mg | Freq: Once | INTRAMUSCULAR | Status: AC
  Administered 2014-10-02: 4 mg via INTRAVENOUS
  Filled 2014-10-02: qty 1

## 2014-10-02 MED ORDER — HALOPERIDOL LACTATE 5 MG/ML IJ SOLN: 6.0000 mg | INTRAMUSCULAR | Status: DC | PRN

## 2014-10-02 MED ORDER — HALOPERIDOL LACTATE 5 MG/ML IJ SOLN
INTRAMUSCULAR | Status: AC
  Filled 2014-10-02: qty 2

## 2014-10-02 MED ORDER — RISPERIDONE 1 MG PO TABS
1.0000 mg | ORAL_TABLET | Freq: Every day | ORAL | Status: DC
  Filled 2014-10-02 (×2): qty 1

## 2014-10-02 MED ORDER — SODIUM CHLORIDE 0.9 % IV BOLUS (SEPSIS)
500.0000 mL | Freq: Once | INTRAVENOUS | Status: AC
  Administered 2014-10-02: 500 mL via INTRAVENOUS

## 2014-10-02 MED ORDER — HALOPERIDOL LACTATE 5 MG/ML IJ SOLN
6.0000 mg | Freq: Once | INTRAMUSCULAR | Status: AC
  Administered 2014-10-02: 6 mg via INTRAVENOUS

## 2014-10-02 MED ORDER — BUDESONIDE 0.25 MG/2ML IN SUSP
0.2500 mg | Freq: Two times a day (BID) | RESPIRATORY_TRACT | Status: DC
  Administered 2014-10-02 – 2014-10-07 (×11): 0.25 mg via RESPIRATORY_TRACT
  Filled 2014-10-02 (×14): qty 2

## 2014-10-02 MED ORDER — LORAZEPAM 2 MG/ML IJ SOLN
1.0000 mg | Freq: Once | INTRAMUSCULAR | Status: AC
  Administered 2014-10-02: 1 mg via INTRAVENOUS
  Filled 2014-10-02: qty 1

## 2014-10-02 MED ORDER — ALBUTEROL SULFATE (2.5 MG/3ML) 0.083% IN NEBU: 2.5000 mg | INHALATION_SOLUTION | RESPIRATORY_TRACT | Status: DC | PRN

## 2014-10-02 MED ORDER — ARFORMOTEROL TARTRATE 15 MCG/2ML IN NEBU
15.0000 ug | INHALATION_SOLUTION | Freq: Two times a day (BID) | RESPIRATORY_TRACT | Status: DC
  Administered 2014-10-02 – 2014-10-07 (×9): 15 ug via RESPIRATORY_TRACT
  Filled 2014-10-02 (×15): qty 2

## 2014-10-02 NOTE — Progress Notes (Signed)
NUTRITION FOLLOW UP   DOCUMENTATION CODES Per approved criteria  -Obesity Unspecified   INTERVENTION:  TF re-initiation vs PO diet advancement RD to follow for nutrition care plan  NUTRITION DIAGNOSIS: Inadequate oral intake related to inability to eat, s/p extubation as evidenced by NPO status, ongoing  New Goal: Pt to meet >/= 90% of their estimated nutrition needs, currently unmet  Monitor:  TF re-initiation, PO diet advancement & intake, weight, labs, I/O's  ASSESSMENT: 68 y.o. Female with COPD and OSA/OHS, [facility] after hospitalization for VDRF in setting RLL PNA. Developed progressive resp failure 10/17 despite BiPAP, resulted in resp arrest and PEA < 10 minutes.   Patient extubated 10/26.    TF (Vital HP formula) via OGT discontinued.  Propofol discontinued.  Speech Path consulted; swallow evaluation pending.  Height: Ht Readings from Last 1 Encounters:  09/22/14 5' (1.524 m)    Weight: Wt Readings from Last 1 Encounters:  10/01/14 155 lb 3.3 oz (70.4 kg)    BMI:  Body mass index is 30.31 kg/(m^2).  Re-estimated Nutritional Needs: Kcal: 1700-1900 Protein: 90-100 gm Fluid: per MD  Skin: Intact  Diet Order: NPO   Intake/Output Summary (Last 24 hours) at 10/02/14 1038 Last data filed at 10/02/14 0730  Gross per 24 hour  Intake   1010 ml  Output    810 ml  Net    200 ml    Labs:   Recent Labs Lab 09/26/14 0400 09/27/14 0400  09/29/14 0355  09/30/14 1505 10/01/14 0402 10/02/14 0337  NA 148* 140  < > 143  < > 143 141 142  K 3.4* 4.6  < > 3.5*  < > 4.4 3.4* 4.2  CL 108 99  < > 100  < > 100 99 102  CO2 32 33*  < > 31  < > 32 28 30  BUN 16 20  < > 26*  < > 33* 33* 18  CREATININE 0.42* 0.37*  < > 0.58  < > 0.57 0.56 0.36*  CALCIUM 8.9 9.0  < > 9.5  < > 9.6 9.5 9.8  MG 1.7 2.0  --  1.8  --   --   --   --   PHOS 2.7  --   --  3.6  --   --   --   --   GLUCOSE 149* 136*  < > 137*  < > 142* 136* 189*  < > =  values in this interval not displayed.  CBG (last 3)   Recent Labs  10/01/14 2354 10/02/14 0406 10/02/14 0754  GLUCAP 184* 161* 139*    Scheduled Meds: . antiseptic oral rinse  7 mL Mouth Rinse q12n4p  . chlorhexidine  15 mL Mouth Rinse BID  . heparin  5,000 Units Subcutaneous 3 times per day  . insulin aspart  0-15 Units Subcutaneous 6 times per day  . ipratropium-albuterol  3 mL Nebulization Q4H  . sodium chloride  500 mL Intravenous Once  . sodium chloride  10-40 mL Intracatheter Q12H    Continuous Infusions: . dextrose 5 % and 0.45 % NaCl with KCl 40 mEq/L 50 mL/hr at 10/02/14 0700    Past Medical History  Diagnosis Date  . COPD (chronic obstructive pulmonary disease)   . Obesity hypoventilation syndrome   . OSA (obstructive sleep apnea)   . HTN (hypertension)   . Schizoaffective disorder   . GERD (gastroesophageal reflux disease)   . Chronic respiratory failure with hypoxia   .  Hyperlipidemia   . Renal angiolipoma   . Hepatic cyst     History reviewed. No pertinent past surgical history.  Arthur Holms, RD, LDN Pager #: [phone] After-Hours Pager #: [phone]

## 2014-10-02 NOTE — Progress Notes (Signed)
PULMONARY / CRITICAL CARE MEDICINE   Name: Debbie Pena MRN: [id number] DOB: [birth year]    ADMISSION DATE:  09/22/2014  CHIEF COMPLAINT:  Acute on chronic respiratory failure  INITIAL PRESENTATION: Pt with COPD and OSA/OHS, [facility] after hospitalization for VDRF in setting RLL PNA. Developed progressive resp failure 10/17 despite BiPAP, resulted in resp arrest and PEA < 10 minutes. Pt intubated, stabilized and transferred to [facility] ICU for further care.   STUDIES:  10/13 CT chest:  RLL infiltrate, B small effusions, L renal angiolipoma 10/18 CT head: ? Chronic R BG lacunar infarct vs dilated prevascular space. No evidence CVA or global anoxia  SIGNIFICANT EVENTS: 10/18 Minimally responsive. ?decorticate posturing. Persistent downward gaze.  10/19 Improving MS, following commands, nodding to questions.  10/20-22 Tolerates high PSV 10/22 failing versed int, started On propofol gtt 10/26 Extubated and tolerated well. Markedly confused at night - responded to haloperidol 10/27   SUBJECTIVE:  No resp distress. No new complaints. Cognition much improved.  VITAL SIGNS: Temp:  [97.4 F (36.3 C)-98.6 F (37 C)] 97.4 F (36.3 C) (10/27 1100) Pulse Rate:  [89-137] 92 (10/27 1400) Resp:  [19-29] 21 (10/27 1400) BP: (99-172)/(44-103) 99/47 mmHg (10/27 1400) SpO2:  [92 %-100 %] 94 % (10/27 1400) HEMODYNAMICS: CVP:  [6 mmHg-14 mmHg] 11 mmHg VENTILATOR SETTINGS:   INTAKE / OUTPUT:  Intake/Output Summary (Last 24 hours) at 10/02/14 1440 Last data filed at 10/02/14 1400  Gross per 24 hour  Intake   1200 ml  Output    850 ml  Net    350 ml    PHYSICAL EXAMINATION: General:  NAD Neuro:  Diffusely weak, no focal deficits, calm HEENT: WNL   Cardiovascular:  Reg, no M Lungs:  Clear  Abdomen:  Obese, soft, +BS Ext: warm, no edema  LABS: I have reviewed all of today's lab results. Relevant abnormalities are discussed in the A/P section  CXR: Low  volumes, NAD  ASSESSMENT / PLAN:  PULMONARY ETT 10/17 >> 10/26 A: Acute resp failure, resolved Chronic resp failure Respiratory arrest 10/17 at [facility] OSA/OHS, not on regular CPAP Diffuse pulm infiltrates, resolved Small R pleural effusion, resolved P:   Supp O2 Transfer to SDU  CARDIOVASCULAR CVL L femoral CVC 10/17 >> 10/20 A: PEA arrest 10/17 Shock post-CPR, resolved Sinus tachycardia, mild P:  Cont PRN metoprolol HR goal < 115/min MAP goal > 60 mmHg   RENAL A:  AKI, resolved Hypokalemia -resolved P:   Monitor BMET intermittently Monitor I/Os Correct electrolytes as indicated   GASTROINTESTINAL A:   Dysphagia psot extubation Chronic PPI use P:   SUP: enteral PPI SLP eval requested  HEMATOLOGIC A:   No acute issues P:  DVT px: SQ enoxaparin Monitor CBC intermittently Transfuse per usual ICU guidelines  INFECTIOUS A:   Suspect RLL PNA, clinically and radiographically resolved P:   BCx2  10/17 >> NEG C diff 1020 >> NEG  Abx:  Levaquin 10/9 >> 10/16 Azithro 10/17 >> 10/18 Vanco, 10/17 >> 10/19 Aztreonam 10/17 >> 10/19  ENDOCRINE A:   Hyperglycemia, controlled  P:   Cont CBGs/SSI  NEUROLOGIC A:   Severe encephalopathy post-arrest, resolved Agitated delirium, controlled with Haloperidol P:   RASS goal 0 Minimize sedative/opioid analgesics post extubation  Cont PRN haloperidol    TODAY'S SUMMARY:  DC planning. Will likely need LTACH for rehab or SNF    Merton Border, MD ; [facility] [phone].  After 5:30 PM or weekends, call  [phone]   10/02/2014, 2:40 PM

## 2014-10-02 NOTE — Progress Notes (Signed)
eLink Physician-Brief Progress Note Patient Name: ASHLEEN DEMMA DOB: [birth year] MRN: [id number]   Date of Service  10/02/2014  HPI/Events of Note  Low urine outpt.  Agitated.   eICU Interventions  NS 500 ml bolus.  Haldol 4 mg IV x one.      Intervention Category Major Interventions: Other:  Zaheer Wageman 10/02/2014, 4:49 PM

## 2014-10-02 NOTE — Plan of Care (Signed)
Problem: Phase I Progression Outcomes Goal: Hemodynamically stable Outcome: Completed/Met Date Met:  10/02/14 Off pressors

## 2014-10-02 NOTE — Progress Notes (Signed)
eLink Physician-Brief Progress Note Patient Name: Debbie Pena DOB: May 17, 1946 MRN: [id number]   Date of Service  10/02/2014  HPI/Events of Note  Pt feels anxious.  eICU Interventions  Will give ativan x one     Intervention Category Major Interventions: Other:  Rashaun Curl 10/02/2014, 6:16 PM

## 2014-10-03 ENCOUNTER — Inpatient Hospital Stay (HOSPITAL_COMMUNITY): Payer: Medicare Other

## 2014-10-03 DIAGNOSIS — R29898 Other symptoms and signs involving the musculoskeletal system: Secondary | ICD-10-CM

## 2014-10-03 LAB — GLUCOSE, CAPILLARY
GLUCOSE-CAPILLARY: 100 mg/dL — AB (ref 70–99)
Glucose-Capillary: 130 mg/dL — ABNORMAL HIGH (ref 70–99)
Glucose-Capillary: 122 mg/dL — ABNORMAL HIGH (ref 70–99)
Glucose-Capillary: 138 mg/dL — ABNORMAL HIGH (ref 70–99)

## 2014-10-03 MED ORDER — RISPERIDONE 2 MG PO TABS
2.0000 mg | ORAL_TABLET | Freq: Every day | ORAL | Status: DC
  Administered 2014-10-03 – 2014-10-06 (×4): 2 mg via ORAL
  Filled 2014-10-03 (×6): qty 1

## 2014-10-03 NOTE — Procedures (Signed)
Objective Swallowing Evaluation: Modified Barium Swallowing Study  Patient Details  Name: Debbie Pena MRN: [id number] Date of Birth: July 04, 1946  Today's Date: 10/03/2014 Time: 1430-1455 SLP Time Calculation (min): 25 min  Past Medical History:  Past Medical History  Diagnosis Date  . COPD (chronic obstructive pulmonary disease)   . Obesity hypoventilation syndrome   . OSA (obstructive sleep apnea)   . HTN (hypertension)   . Schizoaffective disorder   . GERD (gastroesophageal reflux disease)   . Chronic respiratory failure with hypoxia   . Hyperlipidemia   . Renal angiolipoma   . Hepatic cyst    Past Surgical History: History reviewed. No pertinent past surgical history. HPI:  Pt with COPD and OSA/OHS, [facility] after hospitalization for VDRF in setting RLL PNA. Developed progressive resp failure 10/17 despite BiPAP, resulted in resp arrest and PEA < 10 minutes. Pt intubated, stabilized and transferred to [facility] ICU for further care.  ETT 10/17-10/26.  Bedside swallow eval morning of 10/28 with recs for MBS>    Assessment / Plan / Recommendation Clinical Impression  Dysphagia Diagnosis: Within Functional Limits  Clinical impression: Pt's swallow function improved from time of clinical assessment in am, with absence of clinical signs notable this morning (RN reports pt is more alert this afternoon).  MBS revealed adequate mastication, no delay in timing of swallow response, sufficient pharyngeal mobility, and consistent laryngeal closure.  Swallow mechanism was taxed with large, successive thin liquid boluses, but no aspiration was observed.    Recommend initiating a dysphagia 3 diet with thin liquids; meds whole in puree.   Monitor VS during first few meals to ensure DQ/QI29 remain stable and that there are no recurring signs of poor PO toleration.  SLP will f/u x1 to ensure consistency of toleration, then D/C if swallow safety is maintained.      Treatment Recommendation  Therapy as outlined in treatment plan below    Diet Recommendation Dysphagia 3 (Mechanical Soft);Thin liquid   Liquid Administration via: Cup;Straw Medication Administration: Whole meds with puree Compensations: Slow rate;Small sips/bites Postural Changes and/or Swallow Maneuvers: Seated upright 90 degrees    Other  Recommendations Oral Care Recommendations: Oral care BID   Follow Up Recommendations  None    Frequency and Duration min 1 x/week  1 week     General Date of Onset: 09/22/14 HPI: Pt with COPD and OSA/OHS, [facility] after hospitalization for VDRF in setting RLL PNA. Developed progressive resp failure 10/17 despite BiPAP, resulted in resp arrest and PEA < 10 minutes. Pt intubated, stabilized and transferred to [facility] ICU for further care.  ETT 10/17-10/26.  Type of Study: Modified Barium Swallowing Study Diet Prior to this Study: NPO Temperature Spikes Noted: No Respiratory Status: Room air History of Recent Intubation: Yes Length of Intubations (days): 9 days Date extubated: 10/01/14 Behavior/Cognition: Alert;Cooperative;Pleasant mood Oral Cavity - Dentition: Adequate natural dentition Oral Motor / Sensory Function: Within functional limits Self-Feeding Abilities: Able to feed self;Needs set up Patient Positioning: Upright in chair Baseline Vocal Quality: Hoarse Volitional Cough: Weak Volitional Swallow: Able to elicit Anatomy: Within functional limits Pharyngeal Secretions: Not observed secondary MBS    Reason for Referral     Oral Phase Oral Preparation/Oral Phase Oral Phase: WFL   Pharyngeal Phase Pharyngeal Phase Pharyngeal Phase: Impaired Pharyngeal - Nectar Pharyngeal - Nectar Straw: Within functional limits Pharyngeal - Thin Pharyngeal - Thin Straw: Penetration/Aspiration during swallow Penetration/Aspiration details (thin straw): Material enters airway, remains  ABOVE vocal cords then ejected  out Pharyngeal - Solids Pharyngeal - Puree: Within functional limits Pharyngeal - Mechanical Soft: Within functional limits  Cervical Esophageal Phase   Marnette Perkins L. [place], [place] CCC/SLP Pager [phone]     Cervical Esophageal Phase Cervical Esophageal Phase: Darryll Capers         Juan Quam Laurice 10/03/2014, 3:07 PM

## 2014-10-03 NOTE — Progress Notes (Signed)
MD Deterding notified of decreased UOP. No new orders received at this time. Will continue to monitor pt.

## 2014-10-03 NOTE — Progress Notes (Signed)
PULMONARY / CRITICAL CARE MEDICINE   Name: Debbie Pena MRN: [id number] DOB: 03-30-1946    ADMISSION DATE:  09/22/2014  CHIEF COMPLAINT:  Acute on chronic respiratory failure  INITIAL PRESENTATION: Pt with COPD and OSA/OHS, [facility] after hospitalization for VDRF in setting RLL PNA. Developed progressive resp failure 10/17 despite BiPAP, resulted in resp arrest and PEA < 10 minutes. Pt intubated, stabilized and transferred to [facility] ICU for further care.   STUDIES:  10/13 CT chest:  RLL infiltrate, B small effusions, L renal angiolipoma 10/18 CT head: ? Chronic R BG lacunar infarct vs dilated prevascular space. No evidence CVA or global anoxia  SIGNIFICANT EVENTS: 10/18 Minimally responsive. ?decorticate posturing. Persistent downward gaze.  10/19 Improving MS, following commands, nodding to questions.  10/20-22 Tolerates high PSV 10/22 failing versed int, started On propofol gtt 10/26 Extubated and tolerated well. Markedly confused at night - responded to haloperidol 10/27 Transfer to SDU ordered 10/28 No distress, profoundly weak. Partially oriented. Received one dose lorazepam for agitation last PM. Transfer to med-surg ordered. TRH to assume care in AM 10/29 and PCCM to sign off  SUBJECTIVE:  No resp distress. No new complaints.   VITAL SIGNS: Temp:  [97 F (36.1 C)-98.3 F (36.8 C)] 97 F (36.1 C) (10/28 1100) Pulse Rate:  [86-132] 108 (10/28 1200) Resp:  [16-31] 26 (10/28 1200) BP: (90-177)/(43-126) 152/126 mmHg (10/28 1200) SpO2:  [93 %-100 %] 100 % (10/28 1200) HEMODYNAMICS:   VENTILATOR SETTINGS:   INTAKE / OUTPUT:  Intake/Output Summary (Last 24 hours) at 10/03/14 1503 Last data filed at 10/03/14 1230  Gross per 24 hour  Intake   1560 ml  Output    681 ml  Net    879 ml    PHYSICAL EXAMINATION: General:  NAD Neuro:  Diffusely weak, no focal deficits, calm HEENT: WNL   Cardiovascular:  Reg, no M Lungs:  Clear  Abdomen:   Obese, soft, +BS Ext: warm, no edema  LABS: I have reviewed all of today's lab results. Relevant abnormalities are discussed in the A/P section  CXR: NNF  ASSESSMENT / PLAN:  PULMONARY ETT 10/17 >> 10/26 A: Acute resp failure, resolved Chronic resp failure Respiratory arrest 10/17 at [facility] OSA/OHS, not on regular CPAP Diffuse pulm infiltrates, resolved Small R pleural effusion, resolved P:   Cont supp O2  CARDIOVASCULAR CVL L femoral CVC 10/17 >> 10/20 LUE PICC 10/23 >>  A: PEA arrest 10/17 Shock post-CPR, resolved Sinus tachycardia, mild P:  Cont PRN metoprolol HR goal < 115/min MAP goal > 60 mmHg  RENAL A:  AKI, resolved Hypokalemia -resolved P:   Monitor BMET intermittently Monitor I/Os Correct electrolytes as indicated   GASTROINTESTINAL A:   Dysphagia psot extubation Chronic PPI use P:   SUP: enteral PPI SLP eval requested  HEMATOLOGIC A:   No acute issues P:  DVT px: SQ enoxaparin Monitor CBC intermittently Transfuse per usual ICU guidelines  INFECTIOUS A:   Suspect RLL PNA, clinically and radiographically resolved P:   BCx2  10/17 >> NEG C diff 1020 >> NEG  Abx:  Levaquin 10/9 >> 10/16 Azithro 10/17 >> 10/18 Vanco, 10/17 >> 10/19 Aztreonam 10/17 >> 10/19  ENDOCRINE A:   Hyperglycemia, controlled  P:   Cont CBGs/SSI  NEUROLOGIC A:   Severe encephalopathy post-arrest, resolved Agitated delirium, controlled with Haloperidol Severe deconditioning P:   RASS goal 0 Minimize sedative/opioid analgesics  Cont PRN haloperidol Cont PT No longer has SNF benefits -  will pursue SNF with PT/rehab capabilities    TODAY'S SUMMARY:  Transfer to med-surg. TRH to assume care in AM 10/29 and PCCM to sign off. Discussed with Dr Nicolette Bang, MD ; [facility] [phone].  After 5:30 PM or weekends, call [phone]   10/03/2014, 3:03 PM

## 2014-10-03 NOTE — Evaluation (Signed)
Physical Therapy Evaluation Patient Details Name: Debbie Pena MRN: [id number] DOB: 1946/09/15 Today's Date: 10/03/2014   History of Present Illness  Pt with COPD and OSA/OHS, [facility] after hospitalization for VDRF in setting RLL PNA. Developed progressive resp failure 10/17 despite BiPAP, resulted in resp arrest and PEA < 10 minutes. Pt intubated, stabilized and transferred to [facility] ICU for further care.  ETT 10/17-10/26.   Clinical Impression  Pt admitted with generalized weakness and poor activity tolerance. Pt currently with functional limitations due to the deficits listed below (see PT Problem List). Pt transferred bed to chair with +2 mod A, left knee buckling and pt unable to tolerate further activity. Pt will benefit from skilled PT to increase their independence and safety with mobility to allow discharge to the venue listed below. PT will continue to follow.       Follow Up Recommendations SNF;Supervision/Assistance - 24 hour    Equipment Recommendations  None recommended by PT    Recommendations for Other Services       Precautions / Restrictions Precautions Precautions: Fall Restrictions Weight Bearing Restrictions: No      Mobility  Bed Mobility Overal bed mobility: Needs Assistance Bed Mobility: Supine to Sit     Supine to sit: Mod assist;+2 for safety/equipment     General bed mobility comments: mod A to slide legs off bed and elevate trunk. Mod A to scoot to EOB. Pt anxious at EOB and reports fatigue  Transfers Overall transfer level: Needs assistance Equipment used: 2 person hand held assist Transfers: Sit to/from [facility] Sit to Stand: +2 physical assistance;Mod assist Stand pivot transfers: +2 physical assistance;Mod assist       General transfer comment: pt with left knee buckling, unable to put significant wt on it, required mod A to maintain standing and prevent collapse to left side with  stepping. With support, was able to step each foot to turn to chair.  Ambulation/Gait             General Gait Details: unable to perform safely  Stairs            Wheelchair Mobility    Modified Rankin (Stroke Patients Only)       Balance Overall balance assessment: Needs assistance Sitting-balance support: Bilateral upper extremity supported;Feet supported Sitting balance-Leahy Scale: Fair     Standing balance support: Bilateral upper extremity supported;During functional activity Standing balance-Leahy Scale: Poor                               Pertinent Vitals/Pain Pain Assessment: 0-10 Pain Score: 8  Pain Location: chest with mvmt Pain Descriptors / Indicators: Aching Pain Intervention(s): Monitored during session;Limited activity within patient's tolerance;Patient requesting pain meds-RN notified VSS    Home Living Family/patient expects to be discharged to:: Skilled nursing facility                      Prior Function Level of Independence: Needs assistance   Gait / Transfers Assistance Needed: per pt, she was ambulating with RW without physical assist but could not tell me how long ago this was     Comments: poor historian     Hand Dominance        Extremity/Trunk Assessment   Upper Extremity Assessment: Generalized weakness           Lower Extremity Assessment: Generalized weakness;RLE deficits/detail;LLE deficits/detail RLE  Deficits / Details: unable to lift either leg against gravity but right stonger than left. Pt reports that she has had a CVA but did not see this in her chart. hip flex 2/5, knee ext 3/5 LLE Deficits / Details: hip flex 2/5, knee ext 2/5, knee buckling in standing  Cervical / Trunk Assessment: Kyphotic  Communication   Communication: No difficulties (communicates effectively but minimally)  Cognition Arousal/Alertness: Awake/alert Behavior During Therapy: Flat affect Overall Cognitive  Status: No family/caregiver present to determine baseline cognitive functioning       Memory: Decreased short-term memory              General Comments      Exercises General Exercises - Lower Extremity Ankle Circles/Pumps: AROM;Both;10 reps;Seated      Assessment/Plan    PT Assessment Patient needs continued PT services  PT Diagnosis Difficulty walking;Abnormality of gait;Generalized weakness;Acute pain   PT Problem List Decreased strength;Decreased range of motion;Decreased activity tolerance;Decreased balance;Decreased mobility;Decreased coordination;Decreased cognition;Decreased knowledge of use of DME;Decreased safety awareness;Decreased knowledge of precautions;Pain  PT Treatment Interventions DME instruction;Gait training;Functional mobility training;Therapeutic activities;Therapeutic exercise;Balance training;Neuromuscular re-education;Cognitive remediation;Patient/family education   PT Goals (Current goals can be found in the Care Plan section) Acute Rehab PT Goals Patient Stated Goal: "get out of this program" PT Goal Formulation: With patient Time For Goal Achievement: 10/17/14 Potential to Achieve Goals: Fair    Frequency Min 2X/week   Barriers to discharge        Co-evaluation               End of Session Equipment Utilized During Treatment: Gait belt;Oxygen Activity Tolerance: Patient limited by fatigue Patient left: in chair;with call bell/phone within reach;with nursing/sitter in room Nurse Communication: Mobility status         Time: 1213-1228 PT Time Calculation (min): 15 min   Charges:   PT Evaluation $Initial PT Evaluation Tier I: 1 Procedure PT Treatments $Therapeutic Activity: 8-22 mins   PT G Codes:        Leighton Roach, PT  Acute Rehab Services  [place], [place] 10/03/2014, 1:53 PM

## 2014-10-03 NOTE — Evaluation (Signed)
Clinical/Bedside Swallow Evaluation Patient Details  Name: Debbie Pena MRN: [id number] Date of Birth: 1946/11/20  Today's Date: 10/03/2014 Time: 1035-1050 SLP Time Calculation (min): 15 min  Past Medical History:  Past Medical History  Diagnosis Date  . COPD (chronic obstructive pulmonary disease)   . Obesity hypoventilation syndrome   . OSA (obstructive sleep apnea)   . HTN (hypertension)   . Schizoaffective disorder   . GERD (gastroesophageal reflux disease)   . Chronic respiratory failure with hypoxia   . Hyperlipidemia   . Renal angiolipoma   . Hepatic cyst    Past Surgical History: History reviewed. No pertinent past surgical history. HPI:  Pt with COPD and OSA/OHS, transferred to [facility] 10/16 from [facility] after hospitalization for VDRF in setting RLL PNA. Developed progressive resp failure 10/17 despite BiPAP, resulted in resp arrest and PEA < 10 minutes. Pt intubated, stabilized and transferred to [facility] ICU for further care.  ETT 10/17-10/26.    Assessment / Plan / Recommendation Clinical Impression  Patient presents with intermittent and inconsistent s/s of decreased airway protection across consistencies trialed as well as multiple risk factors for dysphagia and aspiration including multiple, prolonged intubations, dysphonia, weak cough, and recent PNA. Recommend proceeding wtih instrumental testing to determine least restrictive diet prior to initiation of po diet. Will proceed with MBS this pm.     Aspiration Risk  Severe    Diet Recommendation NPO   Medication Administration: Via alternative means    Other  Recommendations Recommended Consults: MBS Oral Care Recommendations:  (QID)   Follow Up Recommendations   (TBD)       Pertinent Vitals/Pain n/a     Swallow Study    General HPI: Pt with COPD and OSA/OHS, transferred to [facility] 10/16 from [facility] after hospitalization for VDRF in setting RLL PNA. Developed progressive resp failure 10/17 despite  BiPAP, resulted in resp arrest and PEA < 10 minutes. Pt intubated, stabilized and transferred to [facility] ICU for further care.  ETT 10/17-10/26.  Type of Study: Bedside swallow evaluation Previous Swallow Assessment: none noted in chart Diet Prior to this Study: NPO Temperature Spikes Noted: No Respiratory Status: Room air History of Recent Intubation: Yes Length of Intubations (days): 9 days Date extubated: 10/01/14 Behavior/Cognition: Alert;Cooperative;Pleasant mood Oral Cavity - Dentition: Adequate natural dentition Self-Feeding Abilities: Needs assist Patient Positioning: Upright in bed Baseline Vocal Quality: Hoarse;Low vocal intensity Volitional Cough: Weak;Congested Volitional Swallow: Able to elicit    Oral/Motor/Sensory Function Overall Oral Motor/Sensory Function: Appears within functional limits for tasks assessed   Ice Chips Ice chips: Impaired Presentation: Spoon Pharyngeal Phase Impairments: Wet Vocal Quality   Thin Liquid Thin Liquid: Impaired Presentation: Cup;Spoon Pharyngeal  Phase Impairments: Multiple swallows;Throat Clearing - Immediate;Throat Clearing - Delayed    Nectar Thick Nectar Thick Liquid: Not tested   Honey Thick Honey Thick Liquid: Not tested   Puree Puree: Impaired Presentation: Spoon Pharyngeal Phase Impairments: Wet Vocal Quality;Multiple swallows;Throat Clearing - Delayed;Cough - Delayed   Solid   GO   Debbie Rainwater MA, CCC-SLP [phone]  Solid: Not tested       Debbie Pena Debbie Pena 10/03/2014,10:55 AM

## 2014-10-03 NOTE — Care Management Note (Addendum)
    Page 1 of 1   10/12/2014     6:48:55 AM CARE MANAGEMENT NOTE 10/12/2014  Patient:  Debbie Pena, Debbie Pena   Account Number:  [id number]  Date Initiated:  09/28/2014  Documentation initiated by:  Debbie Pena  Subjective/Objective Assessment:   dx PNA/PEA; transferred from [facility]     Action/Plan:   Anticipated DC Date:     Anticipated DC Plan:    In-house referral  Clinical Social Worker      DC Planning Services  CM consult      Choice offered to / List presented to:             Status of service:  Completed, signed off Medicare Important Message given?  YES (If response is "NO", the following Medicare IM given date fields will be blank) Date Medicare IM given:  10/11/2014 Medicare IM given by:  Debbie Pena Date Additional Medicare IM given:  10/04/2014 Additional Medicare IM given by:  [facility] Debbie Pena  Discharge Disposition:  LONG TERM ACUTE CARE (LTAC)  Per UR Regulation:  Reviewed for med. necessity/level of care/duration of stay  If discussed at [place] of Stay Meetings, dates discussed:   09/27/2014  10/02/2014  10/04/2014  10/09/2014  10/11/2014    Comments:  10/11/14 1036 Debbie Lefeber RN MSN BSN CCM [place] yesterday, now qualifies for [employer].  TC to both dtrs, they agree to transfer to [facility].  10/08/14 [name] MSN BSN CCM Back to 2S, intubated.  10/02/14 1115 [name] MSN BSN CCM Pt extubated, contacted [facility] liaison to determine if qualifies for return to [place]. 1200 Pt no longer meets criteria for LTAC, will need SNF. CSW consult placed.

## 2014-10-04 DIAGNOSIS — J9600 Acute respiratory failure, unspecified whether with hypoxia or hypercapnia: Secondary | ICD-10-CM

## 2014-10-04 LAB — GLUCOSE, CAPILLARY
GLUCOSE-CAPILLARY: 135 mg/dL — AB (ref 70–99)
Glucose-Capillary: 130 mg/dL — ABNORMAL HIGH (ref 70–99)
Glucose-Capillary: 133 mg/dL — ABNORMAL HIGH (ref 70–99)
Glucose-Capillary: 150 mg/dL — ABNORMAL HIGH (ref 70–99)

## 2014-10-04 LAB — BASIC METABOLIC PANEL
Anion gap: 5 (ref 5–15)
Anion gap: 8 (ref 5–15)
BUN: 6 mg/dL (ref 6–23)
BUN: 6 mg/dL (ref 6–23)
CALCIUM: 8.9 mg/dL (ref 8.4–10.5)
CO2: 31 meq/L (ref 19–32)
CO2: 32 mEq/L (ref 19–32)
CREATININE: 0.38 mg/dL — AB (ref 0.50–1.10)
CREATININE: 0.38 mg/dL — AB (ref 0.50–1.10)
Calcium: 8.9 mg/dL (ref 8.4–10.5)
Chloride: 101 mEq/L (ref 96–112)
Chloride: 103 mEq/L (ref 96–112)
GFR calc Af Amer: >90 mL/min (ref >90)
GFR calc Af Amer: >90 mL/min (ref >90)
GFR calc non Af Amer: >90 mL/min (ref >90)
GLUCOSE: 402 mg/dL — AB (ref 70–99)
Glucose, Bld: 144 mg/dL — ABNORMAL HIGH (ref 70–99)
Potassium: 6.8 mEq/L (ref 3.7–5.3)
Potassium: 3.8 mEq/L (ref 3.7–5.3)
Sodium: 137 mEq/L (ref 137–147)
Sodium: 143 mEq/L (ref 137–147)

## 2014-10-04 LAB — CBC
HCT: 28.5 % — ABNORMAL LOW (ref 36.0–46.0)
HEMOGLOBIN: 8.3 g/dL — AB (ref 12.0–15.0)
MCH: 24.1 pg — AB (ref 26.0–34.0)
MCHC: 29.1 g/dL — AB (ref 30.0–36.0)
MCV: 82.6 fL (ref 78.0–100.0)
Platelets: 235 10*3/uL (ref 150–400)
RBC: 3.45 MIL/uL — ABNORMAL LOW (ref 3.87–5.11)
RDW: 14.5 % (ref 11.5–15.5)
WBC: 6.2 10*3/uL (ref 4.0–10.5)

## 2014-10-04 NOTE — Progress Notes (Signed)
Bilateral lower extremity venous duplex completed:  No obvious evidence of DVT, superficial thrombosis, or Baker's cyst.  Technically difficult study due to the patient's body habitus.   

## 2014-10-04 NOTE — Progress Notes (Signed)
PATIENT DETAILS Name: Debbie Pena Age: 68 y.o. Sex: female Date of Birth: May 10, 1946 Admit Date: 09/22/2014 Admitting Physician Collene Gobble, MD [id number], Provider, MD  Brief summary : Pt with COPD and OSA/OHS, [facility] from [facility] after hospitalization for VDRF in setting RLL PNA. Developed progressive resp failure 10/17 despite BiPAP, resulted in resp arrest and PEA < 10 minutes. Pt intubated, stabilized and transferred to [facility] ICU for further care. Extubated on 10/26, and subsequently transferred out of the ICU. [facility] assumed care on 10/29  Subjective: Awake and alert. No major complaints.  Assessment/Plan: Active Problems:   Acute respiratory failure with hypoxia and hypercarbia: Secondary to COPD exacerbation, OSA/OHS. Intubated on 10/17 following PA arrest, extubated on 10/26. Doing much better, continue with O2.    PEA arrest:secondary to above. Intubated on 10/17 following PA arrest, extubated on 10/26. Check Echo.    HCAP: Afebrile. No longer on any antimicrobial therapy (see below for antibiotic data). Blood culture negative.     Tachycardia: Suspect secondary to bronchodilators, critical illness. However has had prolonged hospitalization, we will check a lower extremity Doppler. If negative we'll proceed to CT angiogram chest. Continue on Lovenox for VTE prophylaxis.     Encephalopathy: Suspect ICU delirium, resolving with supportive care. Continue with Risperdal, minimize benzodiazepines.     AKI: Resolved     Dysphagia:Suspect secondary to deconditioning/acute illness: Now on dysphagia 3 diet. Continue.    History of COPD with chronic respiratory failure, on home O2: Currently stable, continue Brovana and budesonide. Albuterol as needed.     Severe deconditioning: Secondary to acute illness. PT eval, will need SNF on discharge    Schizoaffective disorder: Stable, risperidone resumed.     GERD: Continue PPI     Dyslipidemia:  Resume statins    Disposition: Remain inpatient-in SDU  Antibiotics: Levaquin 10/9 >> 10/16  Azithro 10/17 >> 10/18  Vanco, 10/17 >> 10/19  Aztreonam 10/17 >> 10/19  DVT Prophylaxis: Prophylactic Lovenox   Code Status: Full code   Family Communication None at bedside  Procedures:  None  CONSULTS:  pulmonary/intensive care  Time spent 40 minutes-which includes 50% of the time with face-to-face with patient/ family and coordinating care related to the above assessment and plan.    MEDICATIONS: Scheduled Meds: . antiseptic oral rinse  7 mL Mouth Rinse q12n4p  . arformoterol  15 mcg Nebulization BID  . aspirin  81 mg Oral Daily  . budesonide  0.25 mg Nebulization BID  . chlorhexidine  15 mL Mouth Rinse BID  . enoxaparin (LOVENOX) injection  40 mg Subcutaneous Q24H  . pantoprazole  40 mg Oral Q1200  . risperiDONE  2 mg Oral QHS  . sodium chloride  500 mL Intravenous Once  . sodium chloride  10-40 mL Intracatheter Q12H   Continuous Infusions: . dextrose 5 % and 0.45 % NaCl with KCl 40 mEq/L 50 mL/hr at 10/04/14 0700   PRN Meds:.albuterol, metoprolol, sodium chloride  Antibiotics: Anti-infectives   Start     Dose/Rate Route Frequency Ordered Stop   09/23/14 1000  vancomycin (VANCOCIN) IVPB 1000 mg/200 mL premix  Status:  Discontinued     1,000 mg 200 mL/hr over 60 Minutes Intravenous Every 12 hours 09/22/14 2123 09/24/14 0950   09/22/14 2200  azithromycin (ZITHROMAX) 500 mg in dextrose 5 % 250 mL IVPB  Status:  Discontinued     500 mg 250 mL/hr over 60 Minutes Intravenous Every 24 hours  09/22/14 2013 09/23/14 1114   09/22/14 2200  vancomycin (VANCOCIN) 1,500 mg in sodium chloride 0.9 % 500 mL IVPB     1,500 mg 250 mL/hr over 120 Minutes Intravenous  Once 09/22/14 2123 09/23/14 0028   09/22/14 2200  ceFEPIme (MAXIPIME) 2 g in dextrose 5 % 50 mL IVPB  Status:  Discontinued     2 g 100 mL/hr over 30 Minutes Intravenous Every 12 hours 09/22/14 2123 09/22/14 2128     09/22/14 2200  aztreonam (AZACTAM) 2 g in dextrose 5 % 50 mL IVPB  Status:  Discontinued     2 g 100 mL/hr over 30 Minutes Intravenous 3 times per day 09/22/14 2128 09/24/14 0950   09/22/14 2130  vancomycin (VANCOCIN) 1,500 mg in sodium chloride 0.9 % 500 mL IVPB     1,500 mg 250 mL/hr over 120 Minutes Intravenous  Once 09/22/14 2120 09/23/14 0111       PHYSICAL EXAM: Vital signs in last 24 hours: Filed Vitals:   10/04/14 1100 10/04/14 1200 10/04/14 1211 10/04/14 1300  BP: 114/60 117/78  110/55  Pulse: 93 115  103  Temp:   98.7 F (37.1 C)   TempSrc:   Oral   Resp: 27 31  21   Height:      Weight:      SpO2: 100% 100%  100%    Weight change:  Filed Weights   09/29/14 0600 09/30/14 0500 10/01/14 0600  Weight: 71.4 kg (157 lb 6.5 oz) 71.6 kg (157 lb 13.6 oz) 70.4 kg (155 lb 3.3 oz)   Body mass index is 30.31 kg/(m^2).   Gen Exam: Awake and alert with clear speech.   Neck: Supple, No JVD.   Chest: B/L Clear.   CVS: S1 S2 Regular, no murmurs.  Abdomen: soft, BS +, non tender, non distended. Extremities: no edema, lower extremities warm to touch. Neurologic: Non Focal.   Skin: No Rash.   Wounds: N/A.   Intake/Output from previous day:  Intake/Output Summary (Last 24 hours) at 10/04/14 1323 Last data filed at 10/04/14 1300  Gross per 24 hour  Intake   1380 ml  Output      0 ml  Net   1380 ml     LAB RESULTS: CBC  Recent Labs Lab 09/29/14 0355 09/30/14 0400 10/01/14 0641 10/02/14 0337 10/04/14 0342  WBC 11.8* 11.9* 8.6 12.1* 6.2  HGB 8.1* 9.5* 8.6* 9.0* 8.3*  HCT 26.8* 28.3* 28.0* 31.3* 28.5*  PLT 179 114* 232 229 235  MCV 81.0 91.6 80.2 83.2 82.6  MCH 24.5* 30.7 24.6* 23.9* 24.1*  MCHC 30.2 33.6 30.7 28.8* 29.1*  RDW 17.1* 13.8 16.3* 15.4 14.5    Chemistries   Recent Labs Lab 09/29/14 0355  09/30/14 1505 10/01/14 0402 10/02/14 0337 10/04/14 0342 10/04/14 0450  NA 143  < > 143 141 142 137 143  K 3.5*  < > 4.4 3.4* 4.2 6.8* 3.8  CL 100  <  > 100 99 102 101 103  CO2 31  < > 32 28 30 31  32  GLUCOSE 137*  < > 142* 136* 189* 402* 144*  BUN 26*  < > 33* 33* 18 6 6   CREATININE 0.58  < > 0.57 0.56 0.36* 0.38* 0.38*  CALCIUM 9.5  < > 9.6 9.5 9.8 8.9 8.9  MG 1.8  --   --   --   --   --   --   < > = values in this interval not displayed.  CBG:  Recent Labs Lab 10/03/14 0814 10/03/14 1132 10/03/14 1645 10/03/14 2337 10/04/14 0738  GLUCAP 130* 122* 138* 135* 150*    GFR Estimated Creatinine Clearance: 58.1 ml/min (by C-G formula based on Cr of 0.38).  Coagulation profile No results found for this basename: INR, PROTIME,  in the last 168 hours  Cardiac Enzymes No results found for this basename: CK, CKMB, TROPONINI, MYOGLOBIN,  in the last 168 hours  No components found with this basename: POCBNP,  No results found for this basename: DDIMER,  in the last 72 hours No results found for this basename: HGBA1C,  in the last 72 hours No results found for this basename: CHOL, HDL, LDLCALC, TRIG, CHOLHDL, LDLDIRECT,  in the last 72 hours No results found for this basename: TSH, T4TOTAL, FREET3, T3FREE, THYROIDAB,  in the last 72 hours No results found for this basename: VITAMINB12, FOLATE, FERRITIN, TIBC, IRON, RETICCTPCT,  in the last 72 hours No results found for this basename: LIPASE, AMYLASE,  in the last 72 hours  Urine Studies No results found for this basename: UACOL, UAPR, USPG, UPH, UTP, UGL, UKET, UBIL, UHGB, UNIT, UROB, ULEU, UEPI, UWBC, URBC, UBAC, CAST, CRYS, UCOM, BILUA,  in the last 72 hours  MICROBIOLOGY: Recent Results (from the past 240 hour(s))  CLOSTRIDIUM DIFFICILE BY PCR     Status: None   Collection Time    09/25/14  2:37 PM      Result Value Ref Range Status   C difficile by pcr NEGATIVE  NEGATIVE Final    RADIOLOGY STUDIES/RESULTS: Ct Head Wo Contrast  09/23/2014   CLINICAL DATA:  Cardiac arrest  EXAM: CT HEAD WITHOUT CONTRAST  TECHNIQUE: Contiguous axial images were obtained from the base of  the skull through the vertex without intravenous contrast.  COMPARISON:  None.  FINDINGS: Focal area of low attenuation in the right basal ganglia compatible with either a chronic lacunar infarct or dilated prevascular space. The cerebral and cerebellar hemispheres are otherwise normal in attenuation and morphology. No evidence for acute brain infarct, hemorrhage or mass. No abnormal extra-axial fluid collections identified. The paranasal sinuses and mastoid air cells are clear. The skull is intact.  IMPRESSION: 1. No acute intracranial abnormalities. 2. 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   Electronically Signed   By: Kerby Moors M.D.   On: 09/23/2014 14:37   Dg Chest Port 1 View  10/02/2014   CLINICAL DATA:  Respiratory failure with hypoxia.  EXAM: PORTABLE CHEST - 1 VIEW  COMPARISON:  10/01/2014 and 09/30/2014  FINDINGS: PICC tip has been retracted and is now in the superior vena cava at the level of the azygos vein.  There is persistent bibasilar atelectasis, increased on the right. Heart size and vascularity are normal. No acute osseous abnormality.  IMPRESSION: 1. Increased atelectasis the right lung base. 2. PICC tip has been retracted and is now in the upper superior vena cava.   Electronically Signed   By: Rozetta Nunnery M.D.   On: 10/02/2014 08:05   Dg Chest Port 1 View  10/01/2014   CLINICAL DATA:  Followup respiratory failure  EXAM: PORTABLE CHEST - 1 VIEW  COMPARISON:  Portable chest x-ray of 09/30/2014  FINDINGS: The lungs remain suboptimally aerated with mild basilar volume loss. The tip of the endotracheal tube is approximately 4.5 cm above the carina. Heart size is stable. Left central venous line is unchanged.  IMPRESSION: Little change in poor aeration with basilar atelectasis. Endotracheal tube and  central venous line are unchanged in position.   Electronically Signed   By: Ivar Drape M.D.   On: 10/01/2014 08:03   Dg Chest Port 1 View  09/30/2014    CLINICAL DATA:  68 year old female with acute respiratory failure.  EXAM: PORTABLE CHEST - 1 VIEW  COMPARISON:  Chest x-ray 09/29/2014.  FINDINGS: An endotracheal tube is in place with tip 3.3 cm above the carina. There is a cleft upper extremity PICC with tip terminating in the superior cavoatrial junction. A nasogastric tube is seen extending into the stomach, however, the tip of the nasogastric tube extends below the lower margin of the image. Lung volumes are low, and there are bibasilar opacities which may reflect areas of atelectasis and/or consolidation. No definite pleural effusions. No evidence of pulmonary edema. Mild cardiomegaly. Upper mediastinal contours are distorted by low lung volumes, lordotic positioning and mild patient rotation.  IMPRESSION: 1. Support apparatus, as above. 2. Low lung volumes with persistent bibasilar atelectasis and/or consolidation.   Electronically Signed   By: Vinnie Langton M.D.   On: 09/30/2014 08:12   Dg Chest Port 1 View  09/29/2014   CLINICAL DATA:  Respiratory failure, ventilatory support, intubated  EXAM: PORTABLE CHEST - 1 VIEW  COMPARISON:  09/28/2014  FINDINGS: Exam is rotated to the right. Endotracheal tube 4 cm above the carina. Left PICC line tip proximal SVC. NG tube within the stomach.  Persistent cardiomegaly with improving bibasilar atelectasis and trace pleural effusions. Slightly better lung volumes. Upper lobes remain clear. No pneumothorax. Atherosclerosis of the ectatic aorta.  IMPRESSION: Cardiomegaly with improving bibasilar atelectasis and residual trace effusions.   Electronically Signed   By: Daryll Brod M.D.   On: 09/29/2014 08:21   Dg Chest Port 1 View  09/28/2014   CLINICAL DATA:  Acute respiratory failure.  Subsequent encounter.  EXAM: PORTABLE CHEST - 1 VIEW  COMPARISON:  09/25/2014.  FINDINGS: Support apparatus: Endotracheal tube tip 31 mm from the carina. LEFT upper extremity PICC is present with the tip in the mid SVC, just  inferior to the level of the carina. Enteric tube is present, at least in the stomach. Monitoring leads project over the chest.  Cardiomediastinal Silhouette:  Enlarged, unchanged.  Lungs: Allowing for technical differences, no interval change in pulmonary aeration. Basilar predominant airspace disease and atelectasis. No pneumothorax.  Effusions:  Small bilateral pleural effusions.  Other:  None.  IMPRESSION: 1. Support apparatus in good position. Interval placement of LEFT upper extremity PICC with the tip in the mid SVC. 2. No change in pulmonary aeration with basilar predominant airspace disease and atelectasis. Airspace disease is most compatible with pulmonary edema. 3. Small bilateral pleural effusions.   Electronically Signed   By: Dereck Ligas M.D.   On: 09/28/2014 08:00   Dg Chest Port 1 View  09/25/2014   CLINICAL DATA:  Respiratory failure, evaluate endotracheal tube position  EXAM: PORTABLE CHEST - 1 VIEW  COMPARISON:  Portable exam 1226 hr compared to 09/24/2014  FINDINGS: Tip of endotracheal tube projects 4.4 cm above carina.  Nasogastric tube extends into stomach.  Enlargement of cardiac silhouette with pulmonary vascular congestion.  Diffuse interstitial infiltrates the perihilar regions likely edema.  Bibasilar subsegmental atelectasis and small RIGHT pleural effusion.  No pneumothorax.  IMPRESSION: Persistent pulmonary edema with bibasilar atelectasis and small RIGHT pleural effusion.   Electronically Signed   By: Lavonia Dana M.D.   On: 09/25/2014 12:43   Dg Chest Port 1 View  09/24/2014  CLINICAL DATA:  Respiratory failure; history of COPD and healthcare associated pneumonia  EXAM: PORTABLE CHEST - 1 VIEW  COMPARISON:  Portable chest x-ray of September 23, 2014  FINDINGS: The lungs remain hypoinflated. The interstitial markings are more conspicuous bilaterally. The hemidiaphragms remain obscured. The cardiopericardial silhouette remains enlarged. The pulmonary vascularity is engorged.   The endotracheal tube tip lies 3.6 cm above the crotch of the carina. The esophagogastric tube tip projects below the inferior margin of the image.  IMPRESSION: There has been slight interval deterioration in the appearance of the pulmonary interstitium consistent with progressive interstitial edema as well as bilateral pleural effusions secondary to CHF.   Electronically Signed   By: David  Martinique   On: 09/24/2014 07:56   Dg Chest Port 1 View  09/23/2014   CLINICAL DATA:  Evaluate ET tube location  EXAM: PORTABLE CHEST - 1 VIEW  COMPARISON:  09/22/2014  FINDINGS: Endotracheal tube tip is above the carina. There is a nasogastric tube with tip in the stomach. Lung volumes are low. There is cardiac enlargement, bilateral pleural effusions and interstitial edema this appears slightly increased from previous exam.  IMPRESSION: 1. CHF pattern appears increased from previous exam. 2. ET tube tip is in satisfactory position above the carina.   Electronically Signed   By: Kerby Moors M.D.   On: 09/23/2014 08:31   Dg Chest Port 1 View  09/22/2014   CLINICAL DATA:  Difficulty airway.  Initial encounter  EXAM: PORTABLE CHEST - 1 VIEW  COMPARISON:  09/21/2014  FINDINGS: Endotracheal tube at the level of the lower thoracic trachea, approximately 2 cm above the carina. Nasogastric tube or orogastric tube enters stomach.  Hazy and bandlike basilar lung opacities consistent with atelectasis. Probable small pleural effusion as well. No pneumothorax. Stable heart size and mediastinal contours given differences in projection.  IMPRESSION: 1. After repositioning, endotracheal tube in good position. New orogastric tube is in good position. 2. Improved lung aeration compared to admission radiograph. There is persistent bibasilar atelectasis and pleural fluid.   Electronically Signed   By: Jorje Guild M.D.   On: 09/22/2014 22:35   Dg Chest Port 1 View  09/22/2014   CLINICAL DATA:  Code, respiratory failure, intubated   EXAM: PORTABLE CHEST - 1 VIEW  COMPARISON:  10.16.15  FINDINGS: ET tip into left main bronchus.Right middle lobe opacity improved from prior study. Improved but persistent mild left lower lobe atelectasis.  IMPRESSION: Endotracheal tube in left main bronchus. It should be retracted by about 4 cm with repeat chest radiography. Critical Value/emergent results were called by telephone at the time of interpretation on 09/22/2014 at 7:47 pm to Desire, the charge nurse, who verbally acknowledged these results.   Electronically Signed   By: Skipper Cliche M.D.   On: 09/22/2014 19:48   Dg Chest Port 1 View  09/21/2014   CLINICAL DATA:  Acute on chronic respiratory failure.  Pneumonia.  EXAM: PORTABLE CHEST - 1 VIEW  COMPARISON:  Apr 19, 2014.  FINDINGS: Stable cardiomegaly. Hypoinflation of the lungs is noted. Right pleural effusion is increased in size. Left pleural effusion is slightly improved, with subsegmental atelectasis seen in the left lung base. No pneumothorax is noted.  IMPRESSION: Improved left pleural effusion. Worsening right pleural effusion with probable associated atelectasis. Minimal left basilar subsegmental atelectasis is noted as well.   Electronically Signed   By: Sabino Dick M.D.   On: 09/21/2014 16:11   Dg Swallowing Func-speech Pathology  10/03/2014  Assunta Curtis, CCC-SLP     10/03/2014  3:08 PM Objective Swallowing Evaluation: Modified Barium Swallowing Study   Patient Details  Name: MAIMOUNA RONDEAU MRN: [id number] Date of Birth: [birth year]  Today's Date: 10/03/2014 Time: 1430-1455 SLP Time Calculation (min): 25 min  Past Medical History:  Past Medical History  Diagnosis Date  . COPD (chronic obstructive pulmonary disease)   . Obesity hypoventilation syndrome   . OSA (obstructive sleep apnea)   . HTN (hypertension)   . Schizoaffective disorder   . GERD (gastroesophageal reflux disease)   . Chronic respiratory failure with hypoxia   . Hyperlipidemia   . Renal angiolipoma   .  Hepatic cyst    Past Surgical History: History reviewed. No pertinent past  surgical history. HPI:  Pt with COPD and OSA/OHS, [facility] from  [facility] after hospitalization for VDRF in setting RLL PNA.  Developed progressive resp failure 10/17 despite BiPAP, resulted  in resp arrest and PEA < 10 minutes. Pt intubated, stabilized and  transferred to [facility] ICU for further care.  ETT 10/17-10/26.   Bedside swallow eval morning of 10/28 with recs for MBS>    Assessment / Plan / Recommendation Clinical Impression  Dysphagia Diagnosis: Within Functional Limits  Clinical impression: Pt's swallow function improved from time of  clinical assessment in am, with absence of clinical signs notable  this morning (RN reports pt is more alert this afternoon).  MBS  revealed adequate mastication, no delay in timing of swallow  response, sufficient pharyngeal mobility, and consistent  laryngeal closure.  Swallow mechanism was taxed with large,  successive thin liquid boluses, but no aspiration was observed.    Recommend initiating a dysphagia 3 diet with thin liquids; meds  whole in puree.   Monitor VS during first few meals to ensure UX/NA35 remain stable  and that there are no recurring signs of poor PO toleration.  SLP  will f/u x1 to ensure consistency of toleration, then D/C if  swallow safety is maintained.     Treatment Recommendation  Therapy as outlined in treatment plan below    Diet Recommendation Dysphagia 3 (Mechanical Soft);Thin liquid   Liquid Administration via: Cup;Straw Medication Administration: Whole meds with puree Compensations: Slow rate;Small sips/bites Postural Changes and/or Swallow Maneuvers: Seated upright 90  degrees    Other  Recommendations Oral Care Recommendations: Oral care BID   Follow Up Recommendations  None    Frequency and Duration min 1 x/week  1 week     General Date of Onset: 09/22/14 HPI: Pt with COPD and OSA/OHS, [facility] from  [facility] after  hospitalization for VDRF in setting RLL PNA.  Developed progressive resp failure 10/17 despite BiPAP, resulted  in resp arrest and PEA < 10 minutes. Pt intubated, stabilized and  transferred to [facility] ICU for further care.  ETT 10/17-10/26.  Type of Study: Modified Barium Swallowing Study Diet Prior to this Study: NPO Temperature Spikes Noted: No Respiratory Status: Room air History of Recent Intubation: Yes Length of Intubations (days): 9 days Date extubated: 10/01/14 Behavior/Cognition: Alert;Cooperative;Pleasant mood Oral Cavity - Dentition: Adequate natural dentition Oral Motor / Sensory Function: Within functional limits Self-Feeding Abilities: Able to feed self;Needs set up Patient Positioning: Upright in chair Baseline Vocal Quality: Hoarse Volitional Cough: Weak Volitional Swallow: Able to elicit Anatomy: Within functional limits Pharyngeal Secretions: Not observed secondary MBS    Reason for Referral     Oral Phase Oral Preparation/Oral Phase Oral Phase: [facility]  Pharyngeal Phase Pharyngeal Phase Pharyngeal Phase: Impaired Pharyngeal - Nectar Pharyngeal - Nectar Straw: Within functional limits Pharyngeal - Thin Pharyngeal - Thin Straw: Penetration/Aspiration during swallow Penetration/Aspiration details (thin straw): Material enters  airway, remains ABOVE vocal cords then ejected out Pharyngeal - Solids Pharyngeal - Puree: Within functional limits Pharyngeal - Mechanical Soft: Within functional limits  Cervical Esophageal Phase   Amanda L. [place], [place] CCC/SLP Pager [phone]     Cervical Esophageal Phase Cervical Esophageal Phase: Darryll Capers         Juan Quam Laurice 10/03/2014, 3:07 PM     Oren Binet, MD  [facility] Pager:[phone]  If 7PM-7AM, please contact night-coverage [email or web address] Password [email or web address] 10/04/2014, 1:23 PM   LOS: 12 days

## 2014-10-04 NOTE — Clinical Social Work Placement (Addendum)
Clinical Social Work Department CLINICAL SOCIAL WORK PLACEMENT NOTE 10/04/2014  Patient:  Debbie Pena, Debbie Pena  Account Number:  [id number] Admit date:  09/22/2014  Clinical Social Worker:  [place], LCSWA  Date/time:  10/04/2014 04:15 PM  Clinical Social Work is seeking post-discharge placement for this patient at the following level of care:   SKILLED NURSING   (*CSW will update this form in Epic as items are completed)   10/04/2014  Patient/family provided with [place] Department of Clinical Social Work's list of facilities offering this level of care within the geographic area requested by the patient (or if unable, by the patient's family).  10/04/2014  Patient/family informed of their freedom to choose among providers that offer the needed level of care, that participate in Medicare, Medicaid or managed care program needed by the patient, have an available bed and are willing to accept the patient.  10/04/2014  Patient/family informed of [facility]' ownership interest in [facility], as well as of the fact that they are under no obligation to receive care at this facility.  PASARR submitted to EDS on 10-04-14 PASARR number received on   FL2 transmitted to all facilities in geographic area requested by pt/family on  10-04-14 FL2 transmitted to all facilities within larger geographic area on   Patient informed that his/her managed care company has contracts with or will negotiate with  certain facilities, including the following:     Patient/family informed of bed offers received:   Patient chooses bed at  Physician recommends and patient chooses bed at    Patient to be transferred to  on   Patient to be transferred to facility by  Patient and family notified of transfer on  Name of family member notified:    The following physician request were entered in Epic:   Additional Comments:  Cloma Rahrig R. [place], MSW, [place] 10/04/2014 4:34 PM

## 2014-10-04 NOTE — Clinical Social Work Psychosocial (Addendum)
Clinical Social Work Department BRIEF PSYCHOSOCIAL ASSESSMENT 10/04/2014  Patient:  Debbie Pena, Debbie Pena     Account Number:  [id number]     Admit date:  09/22/2014  Clinical Social Worker:  Dian Queen  Date/Time:  10/04/2014 03:59 PM  Referred by:  Physician  Date Referred:  10/04/2014 Referred for  SNF Placement   Other Referral:   Interview type:  Patient Other interview type:    PSYCHOSOCIAL DATA Living Status:  FAMILY Admitted from facility:   Level of care:   Primary support name:   Primary support relationship to patient:  FAMILY Degree of support available:   Patient states she lives with her daughter could not get a hold of daughter to confirm    CURRENT CONCERNS Current Concerns  Post-Acute Placement   Other Concerns:    SOCIAL WORK ASSESSMENT / PLAN Patient is a 68 year old female who is from [place], and she states she lives with her daughters.  Patient stated she is open to go to rehab somewhere, patient stated she does not care where just as long as I get better.  Patient was talkative and smiling had a little confusion about what was going on because she was informed by the nurse that she will be moving to a different floor.  Patient in agreement to SNF for rehab and states she has been before.   Assessment/plan status:   Other assessment/ plan:   Information/referral to community resources:    PATIENT'S/FAMILY'S RESPONSE TO PLAN OF CARE: Patient in agreement of plan of care for placement once patient is ready to discharge.    Jones Broom. [place], MSW, [place] 10/04/2014 4:13 PM

## 2014-10-04 NOTE — Progress Notes (Signed)
Speech Language Pathology Treatment: Dysphagia  Patient Details Name: SANAA ZILBERMAN MRN: [id number] DOB: May 12, 1946 Today's Date: 10/04/2014 Time: 0915-0929 SLP Time Calculation (min): 14 min  Assessment / Plan / Recommendation Clinical Impression  Skilled observation of patient self feeding am meal complete. Max physical cueing and encouragement provided for upright positioning in bed to maximize safety with pos. Otherwise, patient utilizing slow rate of intake and small bites/sips independently with po intake which was minimal (decreased appetite). Overall, no overt indication of aspiration observed however recommend brief follow up at bedside to monitor for tolerance given limited observation today and general fatigue which may limit ability to consistently protect airway.    HPI HPI: Pt with COPD and OSA/OHS, [facility] after hospitalization for VDRF in setting RLL PNA. Developed progressive resp failure 10/17 despite BiPAP, resulted in resp arrest and PEA < 10 minutes. Pt intubated, stabilized and transferred to [facility] ICU for further care.  ETT 10/17-10/26.    Pertinent Vitals Pain Assessment: No/denies pain  SLP Plan  Continue with current plan of care    Recommendations Diet recommendations: Dysphagia 3 (mechanical soft);Thin liquid Liquids provided via: Cup;Straw Medication Administration: Whole meds with puree Supervision: Patient able to self feed;Full supervision/cueing for compensatory strategies Compensations: Slow rate;Small sips/bites Postural Changes and/or Swallow Maneuvers: Seated upright 90 degrees             Oral Care Recommendations: Oral care BID Follow up Recommendations: None Plan: Continue with current plan of care    [facility], [place] [phone]   Julieann Drummonds Meryl 10/04/2014, 9:33 AM

## 2014-10-04 NOTE — Progress Notes (Signed)
  Recent Labs Lab 09/29/14 0355 09/30/14 0400 09/30/14 1505 10/01/14 0402 10/02/14 0337 10/04/14 0342  NA 143 139 143 141 142 137  K 3.5* 4.6 4.4 3.4* 4.2 6.8*  CL 100 104 100 99 102 101  CO2 31 24 32 28 30 31   GLUCOSE 137* 137* 142* 136* 189* 402*  BUN 26* 30* 33* 33* 18 6  CREATININE 0.58 1.61* 0.57 0.56 0.36* 0.38*  CALCIUM 9.5 8.2* 9.6 9.5 9.8 8.9  MG 1.8  --   --   --   --   --   PHOS 3.6  --   --   --   --   --      K 6.8 with high sugar. Patient on D5 with KCL   RN says this likely spurious due to draw from near infusin area  Plan Await repeat bmet  Dr. Brand Males, M.D., [facility].C.P Pulmonary and Critical Care Medicine Staff Physician [facility] Pager: [phone], If no answer or between  15:00h - 7:00h: call [phone]  10/04/2014 4:55 AM

## 2014-10-05 ENCOUNTER — Inpatient Hospital Stay (HOSPITAL_COMMUNITY): Payer: Medicare Other

## 2014-10-05 DIAGNOSIS — I517 Cardiomegaly: Secondary | ICD-10-CM

## 2014-10-05 LAB — GLUCOSE, CAPILLARY
GLUCOSE-CAPILLARY: 125 mg/dL — AB (ref 70–99)
GLUCOSE-CAPILLARY: 143 mg/dL — AB (ref 70–99)
Glucose-Capillary: 131 mg/dL — ABNORMAL HIGH (ref 70–99)

## 2014-10-05 LAB — TSH: TSH: 1.13 u[IU]/mL (ref 0.350–4.500)

## 2014-10-05 MED ORDER — ATORVASTATIN CALCIUM 20 MG PO TABS
20.0000 mg | ORAL_TABLET | Freq: Every day | ORAL | Status: DC
  Administered 2014-10-05 – 2014-10-10 (×6): 20 mg via ORAL
  Filled 2014-10-05 (×5): qty 1
  Filled 2014-10-05: qty 2
  Filled 2014-10-05 (×2): qty 1

## 2014-10-05 MED ORDER — KCL IN DEXTROSE-NACL 40-5-0.45 MEQ/L-%-% IV SOLN: INTRAVENOUS | Status: DC

## 2014-10-05 MED ORDER — METOPROLOL TARTRATE 50 MG PO TABS
50.0000 mg | ORAL_TABLET | Freq: Two times a day (BID) | ORAL | Status: DC
  Administered 2014-10-05 (×2): 50 mg via ORAL
  Filled 2014-10-05 (×4): qty 1

## 2014-10-05 MED ORDER — ENSURE COMPLETE PO LIQD
237.0000 mL | Freq: Two times a day (BID) | ORAL | Status: DC
  Administered 2014-10-07: 237 mL via ORAL

## 2014-10-05 MED ORDER — IOHEXOL 350 MG/ML SOLN
80.0000 mL | Freq: Once | INTRAVENOUS | Status: AC | PRN
  Administered 2014-10-05: 80 mL via INTRAVENOUS

## 2014-10-05 MED ORDER — SODIUM CHLORIDE 0.9 % IV SOLN
INTRAVENOUS | Status: AC
  Administered 2014-10-05 – 2014-10-06 (×2): via INTRAVENOUS

## 2014-10-05 NOTE — Progress Notes (Signed)
Physical Therapy Treatment Patient Details Name: Debbie Pena MRN: [id number] DOB: [birth year] Today's Date: 10/05/2014    History of Present Illness Pt with COPD and OSA/OHS, [facility] after hospitalization for VDRF in setting RLL PNA. Developed progressive resp failure 10/17 despite BiPAP, resulted in resp arrest and PEA < 10 minutes. Pt intubated, stabilized and transferred to [facility] ICU for further care.  ETT 10/17-10/26.     PT Comments    Patient making some good progress today and able to ambulate in the room. Patient was limited due to increased HR to 147 with ambulation. Performed HEP and educated niece with handout. Encourage daily OOB to chair over the weekend. Continue to recommend SNF for ongoing Physical Therapy.     Follow Up Recommendations  SNF;Supervision/Assistance - 24 hour     Equipment Recommendations  None recommended by PT    Recommendations for Other Services       Precautions / Restrictions Precautions Precautions: Fall Restrictions Weight Bearing Restrictions: No    Mobility  Bed Mobility Overal bed mobility: Needs Assistance;+2 for physical assistance Bed Mobility: Sit to Supine       Sit to supine: Mod assist   General bed mobility comments: Mod A with use of rails and to support trunk into upright sitting posture. Cues for positioning and techique  Transfers Overall transfer level: Needs assistance Equipment used: Rolling walker (2 wheeled) Transfers: Sit to/from [facility] Sit to Stand: Min assist Stand pivot transfers: Mod assist (withi VCs to keep turning so she does not sit prematurely)       General transfer comment: A to power up into standing and for safe hand placement with use of RW. No buckling noted in standing this session.   Ambulation/Gait Ambulation/Gait assistance: Min assist Ambulation Distance (Feet): 12 Feet Assistive device: Rolling walker (2 wheeled) Gait  Pattern/deviations: Step-to pattern;Decreased step length - right;Decreased step length - left;Wide base of support   Gait velocity interpretation: Below normal speed for age/gender General Gait Details: A to control RW and for balance. CUes for management of RW. Chair to follow    Stairs            Wheelchair Mobility    Modified Rankin (Stroke Patients Only)       Balance Overall balance assessment: Needs assistance Sitting-balance support: Feet supported;Bilateral upper extremity supported Sitting balance-Leahy Scale: Fair (for brief periods of time)     Standing balance support: Bilateral upper extremity supported Standing balance-Leahy Scale: Poor                      Cognition Arousal/Alertness: Awake/alert Behavior During Therapy: WFL for tasks assessed/performed Overall Cognitive Status: No family/caregiver present to determine baseline cognitive functioning (decreased safey awareness--verbal cues to keep turning so she does not sit prematurely)       Memory: Decreased short-term memory              Exercises General Exercises - Lower Extremity Ankle Circles/Pumps: AROM;Both;10 reps;Seated Quad Sets: AROM;Both;5 reps Heel Slides: AAROM;Both;10 reps Hip ABduction/ADduction: AAROM;Both;10 reps Straight Leg Raises: AAROM;Both;5 reps    General Comments        Pertinent Vitals/Pain Pain Assessment: No/denies pain    Home Living Family/patient expects to be discharged to:: Skilled nursing facility                    Prior Function  PT Goals (current goals can now be found in the care plan section) Acute Rehab PT Goals Patient Stated Goal: back to bed Progress towards PT goals: Progressing toward goals    Frequency  Min 2X/week    PT Plan Current plan remains appropriate    Co-evaluation             End of Session Equipment Utilized During Treatment: Gait belt;Oxygen Activity Tolerance: Patient limited  by fatigue;Other (comment) (HR 147 with amb)       Time: 3235-5732 PT Time Calculation (min): 26 min  Charges:  $Gait Training: 8-22 mins $Therapeutic Exercise: 8-22 mins                    G Codes:      Jacqualyn Posey 10/05/2014, 2:58 PM 10/05/2014 Jacqualyn Posey PTA [phone] pager [phone] office

## 2014-10-05 NOTE — Progress Notes (Signed)
PATIENT DETAILS Name: Debbie Pena Age: 68 y.o. Sex: female Date of Birth: [birth year] Admit Date: 09/22/2014 Admitting Physician Collene Gobble, MD CZY:[id number], Provider, MD  Brief summary : Pt with COPD and OSA/OHS, [facility] after hospitalization for VDRF in setting RLL PNA. Developed progressive resp failure 10/17 despite BiPAP, resulted in resp arrest and PEA < 10 minutes. Pt intubated, stabilized and transferred to [facility] ICU for further care. Extubated on 10/26, and subsequently transferred out of the ICU. [facility] assumed care on 10/29   Subjective: Awake and alert. Denies any headache, no chest abdominal pain, no shortness of breath, confirms home use of oxygen. Extreme Bilateral lower extremity weakness since ongoing for the last 2-3 weeks.    Assessment/Plan:      Acute respiratory failure with hypoxia and hypercarbia: Secondary to COPD exacerbation, OSA/OHS. Intubated on 10/17 following PA arrest, extubated on 10/26. Doing much better, continue with O2.       PEA arrest:secondary to above. Intubated on 10/17 following PA arrest, extubated on 10/26. Check Echo.       HCAP: Afebrile. No longer on any antimicrobial therapy (see below for antibiotic data). Blood culture negative.        Tachycardia: Suspect secondary to bronchodilators, critical illness. However has had prolonged hospitalization, negative lower extremity Doppler. Gently hydrate, Get EKG, CTA , TSH and TTE . Continue on Lovenox for VTE prophylaxis.      HTN - start on low-dose beta blocker and monitor.       Encephalopathy: Suspect ICU delirium, resolving with supportive care. Continue with Risperdal, minimize benzodiazepines.         AKI: Resolved        Dysphagia:Suspect secondary to deconditioning/acute illness: Now on dysphagia 3 diet. Speech following.         History of COPD with chronic respiratory failure, on home O2: Currently stable,  continue Brovana and budesonide. Albuterol as     needed.        Severe deconditioning: Secondary to acute illness. PT eval, will need SNF on discharge     Schizoaffective disorder: Stable, risperidone resumed.      GERD: Continue PPI      Dyslipidemia: Resume statins      Disposition: Look for SNF versus LTAC    Antibiotics: Levaquin 10/9 >> 10/16  Azithro 10/17 >> 10/18  Vanco, 10/17 >> 10/19  Aztreonam 10/17 >> 10/19  DVT Prophylaxis: Prophylactic Lovenox   Code Status: Full code   Family Communication None at bedside  Procedures:  None  CONSULTS:  pulmonary/intensive care  Time spent 40 minutes     MEDICATIONS: Scheduled Meds: . antiseptic oral rinse  7 mL Mouth Rinse q12n4p  . arformoterol  15 mcg Nebulization BID  . aspirin  81 mg Oral Daily  . budesonide  0.25 mg Nebulization BID  . chlorhexidine  15 mL Mouth Rinse BID  . enoxaparin (LOVENOX) injection  40 mg Subcutaneous Q24H  . pantoprazole  40 mg Oral Q1200  . risperiDONE  2 mg Oral QHS  . sodium chloride  500 mL Intravenous Once  . sodium chloride  10-40 mL Intracatheter Q12H   Continuous Infusions: . sodium chloride    . dextrose 5 % and 0.45 % NaCl with KCl 40 mEq/L 50 mL/hr at 10/04/14 0700   PRN Meds:.albuterol, metoprolol, sodium chloride  Antibiotics: Anti-infectives   Start     Dose/Rate Route Frequency Ordered Stop  09/23/14 1000  vancomycin (VANCOCIN) IVPB 1000 mg/200 mL premix  Status:  Discontinued     1,000 mg 200 mL/hr over 60 Minutes Intravenous Every 12 hours 09/22/14 2123 09/24/14 0950   09/22/14 2200  azithromycin (ZITHROMAX) 500 mg in dextrose 5 % 250 mL IVPB  Status:  Discontinued     500 mg 250 mL/hr over 60 Minutes Intravenous Every 24 hours 09/22/14 2013 09/23/14 1114   09/22/14 2200  vancomycin (VANCOCIN) 1,500 mg in sodium chloride 0.9 % 500 mL IVPB     1,500 mg 250 mL/hr over 120 Minutes Intravenous  Once 09/22/14 2123 09/23/14 0028   09/22/14 2200   ceFEPIme (MAXIPIME) 2 g in dextrose 5 % 50 mL IVPB  Status:  Discontinued     2 g 100 mL/hr over 30 Minutes Intravenous Every 12 hours 09/22/14 2123 09/22/14 2128   09/22/14 2200  aztreonam (AZACTAM) 2 g in dextrose 5 % 50 mL IVPB  Status:  Discontinued     2 g 100 mL/hr over 30 Minutes Intravenous 3 times per day 09/22/14 2128 09/24/14 0950   09/22/14 2130  vancomycin (VANCOCIN) 1,500 mg in sodium chloride 0.9 % 500 mL IVPB     1,500 mg 250 mL/hr over 120 Minutes Intravenous  Once 09/22/14 2120 09/23/14 0111       PHYSICAL EXAM: Vital signs in last 24 hours: Filed Vitals:   10/04/14 2107 10/04/14 2129 10/05/14 0040 10/05/14 0533  BP: 133/79  141/63 142/49  Pulse: 96  121 115  Temp: 98.1 F (36.7 C)   97.5 F (36.4 C)  TempSrc: Oral   Oral  Resp: 19   16  Height:      Weight:      SpO2: 100% 100%  93%    Weight change:  Filed Weights   09/29/14 0600 09/30/14 0500 10/01/14 0600  Weight: 71.4 kg (157 lb 6.5 oz) 71.6 kg (157 lb 13.6 oz) 70.4 kg (155 lb 3.3 oz)   Body mass index is 30.31 kg/(m^2).   Gen Exam: Awake and alert with clear speech.   Neck: Supple, No JVD.   Chest: B/L Clear.   CVS: S1 S2 Regular, no murmurs.  Abdomen: soft, BS +, non tender, non distended. Extremities: no edema, lower extremities warm to touch. Neurologic: Non Focal.  Extremely weak bilateral lower extremities patient says this has been the case for the last 2-3 weeks Skin: No Rash.   Wounds: N/A.   Intake/Output from previous day:  Intake/Output Summary (Last 24 hours) at 10/05/14 0949 Last data filed at 10/05/14 0845  Gross per 24 hour  Intake 610.83 ml  Output      0 ml  Net 610.83 ml     LAB RESULTS: CBC  Recent Labs Lab 09/29/14 0355 09/30/14 0400 10/01/14 0641 10/02/14 0337 10/04/14 0342  WBC 11.8* 11.9* 8.6 12.1* 6.2  HGB 8.1* 9.5* 8.6* 9.0* 8.3*  HCT 26.8* 28.3* 28.0* 31.3* 28.5*  PLT 179 114* 232 229 235  MCV 81.0 91.6 80.2 83.2 82.6  MCH 24.5* 30.7 24.6* 23.9*  24.1*  MCHC 30.2 33.6 30.7 28.8* 29.1*  RDW 17.1* 13.8 16.3* 15.4 14.5    Chemistries   Recent Labs Lab 09/29/14 0355  09/30/14 1505 10/01/14 0402 10/02/14 0337 10/04/14 0342 10/04/14 0450  NA 143  < > 143 141 142 137 143  K 3.5*  < > 4.4 3.4* 4.2 6.8* 3.8  CL 100  < > 100 99 102 101 103  CO2 31  < >  32 28 30 31  32  GLUCOSE 137*  < > 142* 136* 189* 402* 144*  BUN 26*  < > 33* 33* 18 6 6   CREATININE 0.58  < > 0.57 0.56 0.36* 0.38* 0.38*  CALCIUM 9.5  < > 9.6 9.5 9.8 8.9 8.9  MG 1.8  --   --   --   --   --   --   < > = values in this interval not displayed.  CBG:  Recent Labs Lab 10/03/14 2337 10/04/14 0738 10/04/14 1618 10/04/14 2349 10/05/14 0749  GLUCAP 135* 150* 133* 130* 143*    GFR Estimated Creatinine Clearance: 58.1 ml/min (by C-G formula based on Cr of 0.38).  Coagulation profile No results found for this basename: INR, PROTIME,  in the last 168 hours  Cardiac Enzymes No results found for this basename: CK, CKMB, TROPONINI, MYOGLOBIN,  in the last 168 hours  No components found with this basename: POCBNP,  No results found for this basename: DDIMER,  in the last 72 hours No results found for this basename: HGBA1C,  in the last 72 hours No results found for this basename: CHOL, HDL, LDLCALC, TRIG, CHOLHDL, LDLDIRECT,  in the last 72 hours No results found for this basename: TSH, T4TOTAL, FREET3, T3FREE, THYROIDAB,  in the last 72 hours No results found for this basename: VITAMINB12, FOLATE, FERRITIN, TIBC, IRON, RETICCTPCT,  in the last 72 hours No results found for this basename: LIPASE, AMYLASE,  in the last 72 hours  Urine Studies No results found for this basename: UACOL, UAPR, USPG, UPH, UTP, UGL, UKET, UBIL, UHGB, UNIT, UROB, ULEU, UEPI, UWBC, URBC, UBAC, CAST, CRYS, UCOM, BILUA,  in the last 72 hours  MICROBIOLOGY: Recent Results (from the past 240 hour(s))  CLOSTRIDIUM DIFFICILE BY PCR     Status: None   Collection Time    09/25/14  2:37 PM        Result Value Ref Range Status   C difficile by pcr NEGATIVE  NEGATIVE Final    RADIOLOGY STUDIES/RESULTS: Ct Head Wo Contrast  09/23/2014   CLINICAL DATA:  Cardiac arrest  EXAM: CT HEAD WITHOUT CONTRAST  TECHNIQUE: Contiguous axial images were obtained from the base of the skull through the vertex without intravenous contrast.  COMPARISON:  None.  FINDINGS: Focal area of low attenuation in the right basal ganglia compatible with either a chronic lacunar infarct or dilated prevascular space. The cerebral and cerebellar hemispheres are otherwise normal in attenuation and morphology. No evidence for acute brain infarct, hemorrhage or mass. No abnormal extra-axial fluid collections identified. The paranasal sinuses and mastoid air cells are clear. The skull is intact.  IMPRESSION: 1. No acute intracranial abnormalities. 2. 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   Electronically Signed   By: Kerby Moors M.D.   On: 09/23/2014 14:37   Dg Chest Port 1 View  10/02/2014   CLINICAL DATA:  Respiratory failure with hypoxia.  EXAM: PORTABLE CHEST - 1 VIEW  COMPARISON:  10/01/2014 and 09/30/2014  FINDINGS: PICC tip has been retracted and is now in the superior vena cava at the level of the azygos vein.  There is persistent bibasilar atelectasis, increased on the right. Heart size and vascularity are normal. No acute osseous abnormality.  IMPRESSION: 1. Increased atelectasis the right lung base. 2. PICC tip has been retracted and is now in the upper superior vena cava.   Electronically Signed   By: Rozetta Nunnery M.D.   On:  10/02/2014 08:05     Dg Swallowing Func-speech Pathology  10/03/2014   Assunta Curtis, CCC-SLP     10/03/2014  3:08 PM Objective Swallowing Evaluation: Modified Barium Swallowing Study   Patient Details  Name: Debbie Pena MRN: [id number] Date of Birth: Nov 12, 1946  Today's Date: 10/03/2014 Time: 1430-1455 SLP Time Calculation (min): 25 min  Past  Medical History:  Past Medical History  Diagnosis Date  . COPD (chronic obstructive pulmonary disease)   . Obesity hypoventilation syndrome   . OSA (obstructive sleep apnea)   . HTN (hypertension)   . Schizoaffective disorder   . GERD (gastroesophageal reflux disease)   . Chronic respiratory failure with hypoxia   . Hyperlipidemia   . Renal angiolipoma   . Hepatic cyst    Past Surgical History: History reviewed. No pertinent past  surgical history. HPI:  Pt with COPD and OSA/OHS, [facility] after hospitalization for VDRF in setting RLL PNA.  Developed progressive resp failure 10/17 despite BiPAP, resulted  in resp arrest and PEA < 10 minutes. Pt intubated, stabilized and  transferred to [facility] ICU for further care.  ETT 10/17-10/26.   Bedside swallow eval morning of 10/28 with recs for MBS>    Assessment / Plan / Recommendation Clinical Impression  Dysphagia Diagnosis: Within Functional Limits  Clinical impression: Pt's swallow function improved from time of  clinical assessment in am, with absence of clinical signs notable  this morning (RN reports pt is more alert this afternoon).  MBS  revealed adequate mastication, no delay in timing of swallow  response, sufficient pharyngeal mobility, and consistent  laryngeal closure.  Swallow mechanism was taxed with large,  successive thin liquid boluses, but no aspiration was observed.    Recommend initiating a dysphagia 3 diet with thin liquids; meds  whole in puree.   Monitor VS during first few meals to ensure EX/NT70 remain stable  and that there are no recurring signs of poor PO toleration.  SLP  will f/u x1 to ensure consistency of toleration, then D/C if  swallow safety is maintained.     Treatment Recommendation  Therapy as outlined in treatment plan below    Diet Recommendation Dysphagia 3 (Mechanical Soft);Thin liquid   Liquid Administration via: Cup;Straw Medication Administration: Whole meds with puree Compensations: Slow rate;Small  sips/bites Postural Changes and/or Swallow Maneuvers: Seated upright 90  degrees    Other  Recommendations Oral Care Recommendations: Oral care BID   Follow Up Recommendations  None    Frequency and Duration min 1 x/week  1 week     General Date of Onset: 09/22/14 HPI: Pt with COPD and OSA/OHS, [facility] after hospitalization for VDRF in setting RLL PNA.  Developed progressive resp failure 10/17 despite BiPAP, resulted  in resp arrest and PEA < 10 minutes. Pt intubated, stabilized and  transferred to [facility] ICU for further care.  ETT 10/17-10/26.  Type of Study: Modified Barium Swallowing Study Diet Prior to this Study: NPO Temperature Spikes Noted: No Respiratory Status: Room air History of Recent Intubation: Yes Length of Intubations (days): 9 days Date extubated: 10/01/14 Behavior/Cognition: Alert;Cooperative;Pleasant mood Oral Cavity - Dentition: Adequate natural dentition Oral Motor / Sensory Function: Within functional limits Self-Feeding Abilities: Able to feed self;Needs set up Patient Positioning: Upright in chair Baseline Vocal Quality: Hoarse Volitional Cough: Weak Volitional Swallow: Able to elicit Anatomy: Within functional limits Pharyngeal Secretions: Not observed secondary MBS    Reason for Referral  Oral Phase Oral Preparation/Oral Phase Oral Phase: WFL   Pharyngeal Phase Pharyngeal Phase Pharyngeal Phase: Impaired Pharyngeal - Nectar Pharyngeal - Nectar Straw: Within functional limits Pharyngeal - Thin Pharyngeal - Thin Straw: Penetration/Aspiration during swallow Penetration/Aspiration details (thin straw): Material enters  airway, remains ABOVE vocal cords then ejected out Pharyngeal - Solids Pharyngeal - Puree: Within functional limits Pharyngeal - Mechanical Soft: Within functional limits  Cervical Esophageal Phase   Amanda L. [place], [place] CCC/SLP Pager [phone]     Cervical Esophageal Phase Cervical Esophageal Phase: Darryll Capers         Juan Quam Laurice  10/03/2014, 3:07 PM     Thurnell Lose, MD  [facility] Pager:[phone]  If 7PM-7AM, please contact night-coverage [email or web address] Password [email or web address] 10/05/2014, 9:49 AM   LOS: 13 days

## 2014-10-05 NOTE — Evaluation (Signed)
Occupational Therapy Evaluation and Discharge Patient Details Name: Debbie Pena MRN: [id number] DOB: 1946/09/21 Today's Date: 10/05/2014    History of Present Illness Pt with COPD and OSA/OHS, [facility] after hospitalization for VDRF in setting RLL PNA. Developed progressive resp failure 10/17 despite BiPAP, resulted in resp arrest and PEA < 10 minutes. Pt intubated, stabilized and transferred to [facility] ICU for further care.  ETT 10/17-10/26.    Clinical Impression   This 68 yo female admitted with above presents to acute OT with decreased balance, decreased mobility, obesity, decreased endurance, increased falls risk, and decreased safety awareness all affecting her ability to care for herself at an independent level as she was prior to getting so sick. She will benefit from OT follow up at [facility]. Acute OT will sign off.    Follow Up Recommendations  SNF    Equipment Recommendations   (TBD at next venue)       Precautions / Restrictions Precautions Precautions: Fall Restrictions Weight Bearing Restrictions: No      Mobility Bed Mobility Overal bed mobility: Needs Assistance;+2 for physical assistance Bed Mobility: Sit to Supine       Sit to supine: Max assist;+2 for physical assistance      Transfers Overall transfer level: Needs assistance Equipment used: Rolling walker (2 wheeled) Transfers: Sit to/from [facility] Sit to Stand: Min assist Stand pivot transfers: Mod assist (withi VCs to keep turning so she does not sit prematurely)            Balance Overall balance assessment: Needs assistance Sitting-balance support: Feet supported;Bilateral upper extremity supported Sitting balance-Leahy Scale: Fair (for brief periods of time)     Standing balance support: Bilateral upper extremity supported Standing balance-Leahy Scale: Poor                              ADL Overall ADL's : Needs  assistance/impaired Eating/Feeding: Set up;Supervision/ safety;Sitting   Grooming: Minimal assistance;Sitting   Upper Body Bathing: Minimal assitance;Sitting   Lower Body Bathing: Maximal assistance (with min A sit<>stand)   Upper Body Dressing : Moderate assistance;Sitting   Lower Body Dressing: Total assistance (with min A sit<>stand)   Toilet Transfer: Moderate assistance;Stand-pivot;RW;BSC   Toileting- Clothing Manipulation and Hygiene: Total assistance (with min A sit<>stand)                         Pertinent Vitals/Pain Pain Assessment: No/denies pain     Hand Dominance  right   Extremity/Trunk Assessment Upper Extremity Assessment Upper Extremity Assessment: Generalized weakness              Cognition Arousal/Alertness: Awake/alert Behavior During Therapy: WFL for tasks assessed/performed Overall Cognitive Status: No family/caregiver present to determine baseline cognitive functioning (decreased safey awareness--verbal cues to keep turning so she does not sit prematurely)                                Home Living Family/patient expects to be discharged to:: Skilled nursing facility                                             OT Diagnosis: Generalized weakness   OT Problem List: Decreased strength;Decreased activity  tolerance;Impaired balance (sitting and/or standing);Obesity;Decreased safety awareness   OT Treatment/Interventions:      OT Goals(Current goals can be found in the care plan section) Acute Rehab OT Goals Patient Stated Goal: back to bed  OT Frequency:                End of Session Equipment Utilized During Treatment: Gait belt;Rolling walker  Activity Tolerance: Patient limited by fatigue Patient left: in bed;with call bell/phone within reach;with bed alarm set;with nursing/sitter in room   Time: 1311-1336 OT Time Calculation (min): 25 min Charges:  OT General Charges $OT Visit: 1  Procedure OT Evaluation $Initial OT Evaluation Tier I: 1 Procedure OT Treatments $Self Care/Home Management : 8-22 mins  Almon Register [phone] 10/05/2014, 2:29 PM

## 2014-10-05 NOTE — Progress Notes (Signed)
Echo Lab  2D Echocardiogram completed.  Jean [place]itte, RDCS 10/05/2014 10:02 AM

## 2014-10-05 NOTE — Progress Notes (Signed)
NUTRITION FOLLOW UP  Intervention:   -Ensure Complete po BID, each supplement provides 350 kcal and 13 grams of protein  Nutrition Dx:   Inadequate oral intake related to decreased appetite, dysphagia as evidenced by PO: 25-30% of meal completion, ongoing  Goal:   Pt to meet >/= 90% of their estimated nutrition needs, currently unmet  Monitor:   PO diet advancement & intake, weight, labs, I/O's  Assessment:   68 y.o. Female with COPD and OSA/OHS, [facility] after hospitalization for VDRF in setting RLL PNA. Developed progressive resp failure 10/17 despite BiPAP, resulted in resp arrest and PEA < 10 minutes.   Pt underwent MBSS which revealed dysphagia dx [facility] and was improved from BSE done on 10/01/14. Recommendations were for dysphagia 3 diet within thin liquids and meds in applesauce.  Intake is poor; PO: 25-30%. Pt would benefit from supplementation to promote nutritional adequacy- will add Ensure Complete po BID, each supplement provides 350 kcal and 13 grams of protein.  Labs reviewed. Creat: 0.38, Glucose: 144, CBGS: 130-143. K, Mg, and Phos WDL.   Height: Ht Readings from Last 1 Encounters:  09/22/14 5' (1.524 m)    Weight Status:   Wt Readings from Last 1 Encounters:  10/01/14 155 lb 3.3 oz (70.4 kg)    Re-estimated needs:  Kcal: 1700-1900  Protein: 90-100 gm  Fluid: 1.7-1.9 L  Skin: Intact  Diet Order: Dysphagia 3   Intake/Output Summary (Last 24 hours) at 10/05/14 1103 Last data filed at 10/05/14 0845  Gross per 24 hour  Intake 510.83 ml  Output      0 ml  Net 510.83 ml    Last BM: 10/04/14   Labs:   Recent Labs Lab 09/29/14 0355  10/02/14 0337 10/04/14 0342 10/04/14 0450  NA 143  < > 142 137 143  K 3.5*  < > 4.2 6.8* 3.8  CL 100  < > 102 101 103  CO2 31  < > 30 31 32  BUN 26*  < > 18 6 6   CREATININE 0.58  < > 0.36* 0.38* 0.38*  CALCIUM 9.5  < > 9.8 8.9 8.9  MG 1.8  --   --   --   --   PHOS 3.6  --   --   --    --   GLUCOSE 137*  < > 189* 402* 144*  < > = values in this interval not displayed.  CBG (last 3)   Recent Labs  10/04/14 1618 10/04/14 2349 10/05/14 0749  GLUCAP 133* 130* 143*    Scheduled Meds: . antiseptic oral rinse  7 mL Mouth Rinse q12n4p  . arformoterol  15 mcg Nebulization BID  . aspirin  81 mg Oral Daily  . atorvastatin  20 mg Oral q1800  . budesonide  0.25 mg Nebulization BID  . chlorhexidine  15 mL Mouth Rinse BID  . enoxaparin (LOVENOX) injection  40 mg Subcutaneous Q24H  . metoprolol tartrate  50 mg Oral BID  . pantoprazole  40 mg Oral Q1200  . risperiDONE  2 mg Oral QHS  . sodium chloride  500 mL Intravenous Once  . sodium chloride  10-40 mL Intracatheter Q12H    Continuous Infusions: . sodium chloride      Odaliz Mcqueary A. Jimmye Norman, RD, LDN Pager: [phone] After hours Pager: [phone]

## 2014-10-05 NOTE — Clinical Social Work Note (Signed)
CSW spoke with patient and patients sister at bedside.  Patient is actually from [facility] in [place], [place].  Patient is agreeable to returning to [facility] when ready to DC.  CSW will continue to follow.  Domenica Reamer, [place] Social Worker [phone]

## 2014-10-05 NOTE — Progress Notes (Signed)
Speech Language Pathology Treatment: Dysphagia  Patient Details Name: Debbie Pena MRN: [id number] DOB: 08-23-1946 Today's Date: 10/05/2014 Time: 2395-3202 SLP Time Calculation (min): 10 min  Assessment / Plan / Recommendation Clinical Impression  Pt appears to be tolerating POs well with no overt s/s of aspiration.  Tolerated thin liquids and crackers with adequate pacing, RR within functional range of adequate swallow/ventilatory coordination.  Min cues overall.  Recommend continuing current diet at D/C.  No SLP f/u recommended.  Will sign off.    HPI HPI: Pt with COPD and OSA/OHS, [facility] after hospitalization for VDRF in setting RLL PNA. Developed progressive resp failure 10/17 despite BiPAP, resulted in resp arrest and PEA < 10 minutes. Pt intubated, stabilized and transferred to [facility] ICU for further care.  ETT 10/17-10/26.    Pertinent Vitals Pain Assessment: No/denies pain  SLP Plan  All goals met    Recommendations Diet recommendations: Dysphagia 3 (mechanical soft);Thin liquid Liquids provided via: Cup;Straw Medication Administration: Whole meds with puree Supervision: Patient able to self feed Compensations: Slow rate;Small sips/bites Postural Changes and/or Swallow Maneuvers: Seated upright 90 degrees              Oral Care Recommendations: Oral care BID Follow up Recommendations: None Plan: All goals met   Toshika Parrow L. Tivis Ringer, [place] CCC/SLP Pager [phone]      Juan Quam Laurice 10/05/2014, 3:17 PM

## 2014-10-05 NOTE — Progress Notes (Signed)
Patient placed on CPAP for HS.  Patient is familiar with equipment and procedure, as patient wears CPAP at home.  Auto titration used, as patient's home settings are unknown.  3 LPM 02 bleed in.  RN aware, patient tolerated well.

## 2014-10-06 ENCOUNTER — Inpatient Hospital Stay (HOSPITAL_COMMUNITY): Payer: Medicare Other

## 2014-10-06 LAB — GLUCOSE, CAPILLARY
Glucose-Capillary: 128 mg/dL — ABNORMAL HIGH (ref 70–99)
Glucose-Capillary: 142 mg/dL — ABNORMAL HIGH (ref 70–99)

## 2014-10-06 MED ORDER — METOPROLOL TARTRATE 50 MG PO TABS
75.0000 mg | ORAL_TABLET | Freq: Two times a day (BID) | ORAL | Status: DC
  Administered 2014-10-06 (×2): 75 mg via ORAL
  Filled 2014-10-06 (×5): qty 1

## 2014-10-06 MED ORDER — CLONAZEPAM 1 MG PO TABS
1.0000 mg | ORAL_TABLET | Freq: Every day | ORAL | Status: DC
  Administered 2014-10-06: 1 mg via ORAL
  Filled 2014-10-06: qty 1

## 2014-10-06 NOTE — Progress Notes (Signed)
PATIENT DETAILS Name: Debbie Pena Age: 68 y.o. Sex: female Date of Birth: 04-04-1946 Admit Date: 09/22/2014 Admitting Physician Collene Gobble, MD [id number], Provider, MD  Brief summary :  Pt with COPD and OSA/OHS, [facility] after hospitalization for VDRF in setting RLL PNA. Developed progressive resp failure 10/17 despite BiPAP, resulted in resp arrest and PEA < 10 minutes. Pt intubated, stabilized and transferred to [facility] ICU for further care. Extubated on 10/26, and subsequently transferred out of the ICU. [facility] assumed care on 10/29   Subjective: Awake and alert. Denies any headache, no chest abdominal pain, no shortness of breath, confirms home use of oxygen. Extreme Bilateral lower extremity weakness since ongoing for the last 2-3 weeks.    Assessment/Plan:      Acute respiratory failure with hypoxia and hypercarbia: Secondary to COPD exacerbation, OSA/OHS. Intubated on 10/17 following PA arrest, extubated on 10/26. Doing much better, continue with O2.       PEA arrest:secondary to above. Intubated on 10/17 following PA arrest, extubated on 10/26. stable Echo EF 60% and no wall motion abnormality.       HCAP: Afebrile. No longer on any antimicrobial therapy (see below for antibiotic data). Blood culture negative.        Tachycardia: Suspect secondary to bronchodilators, critical illness. However has had prolonged hospitalization, negative lower extremity Doppler. Gently hydrate, CT angiogram chest, echo gram, TSH, EKG all nonacute and unremarkable. Continue on Lovenox for VTE prophylaxis. Heart rate much improved with supportive care.      HTN - stable on beta blocker continue.       Encephalopathy: Suspect ICU delirium, resolving with supportive care. Continue with Risperdal, minimize benzodiazepines.        AKI: Resolved       Dysphagia:Suspect secondary to deconditioning/acute illness: Now on dysphagia 3 diet.  Speech following.         History of COPD with chronic respiratory failure, on home O2: Currently stable, continue Brovana and budesonide. Albuterol as     needed.        Severe deconditioning: Secondary to acute illness. PT eval, will need SNF on discharge     Schizoaffective disorder: Stable, risperidone resumed.      GERD: Continue PPI      Dyslipidemia: Resume statins      Disposition: Look for SNF versus LTAC    Antibiotics: Levaquin 10/9 >> 10/16  Azithro 10/17 >> 10/18  Vanco, 10/17 >> 10/19  Aztreonam 10/17 >> 10/19  DVT Prophylaxis: Prophylactic Lovenox   Code Status: Full code   Family Communication None at bedside  Procedures:  TTE  - Left ventricle: The cavity size was normal. Wall thickness was increased in a pattern of mild LVH. The estimated ejection fraction was 60%. Wall motion was normal; there were no regional wall motion abnormalities. - Right ventricle: The cavity size was normal. Systolic function was mildly reduced.   CONSULTS:  pulmonary/intensive care  Time spent 40 minutes     MEDICATIONS: Scheduled Meds: . antiseptic oral rinse  7 mL Mouth Rinse q12n4p  . arformoterol  15 mcg Nebulization BID  . aspirin  81 mg Oral Daily  . atorvastatin  20 mg Oral q1800  . budesonide  0.25 mg Nebulization BID  . chlorhexidine  15 mL Mouth Rinse BID  . clonazePAM  1 mg Oral Daily  . enoxaparin (LOVENOX) injection  40 mg Subcutaneous Q24H  . feeding supplement (  ENSURE COMPLETE)  237 mL Oral BID BM  . metoprolol tartrate  75 mg Oral BID  . pantoprazole  40 mg Oral Q1200  . risperiDONE  2 mg Oral QHS  . sodium chloride  500 mL Intravenous Once  . sodium chloride  10-40 mL Intracatheter Q12H   Continuous Infusions:   PRN Meds:.albuterol, sodium chloride  Antibiotics: Anti-infectives   Start     Dose/Rate Route Frequency Ordered Stop   09/23/14 1000  vancomycin (VANCOCIN) IVPB 1000 mg/200 mL premix  Status:  Discontinued     1,000  mg 200 mL/hr over 60 Minutes Intravenous Every 12 hours 09/22/14 2123 09/24/14 0950   09/22/14 2200  azithromycin (ZITHROMAX) 500 mg in dextrose 5 % 250 mL IVPB  Status:  Discontinued     500 mg 250 mL/hr over 60 Minutes Intravenous Every 24 hours 09/22/14 2013 09/23/14 1114   09/22/14 2200  vancomycin (VANCOCIN) 1,500 mg in sodium chloride 0.9 % 500 mL IVPB     1,500 mg 250 mL/hr over 120 Minutes Intravenous  Once 09/22/14 2123 09/23/14 0028   09/22/14 2200  ceFEPIme (MAXIPIME) 2 g in dextrose 5 % 50 mL IVPB  Status:  Discontinued     2 g 100 mL/hr over 30 Minutes Intravenous Every 12 hours 09/22/14 2123 09/22/14 2128   09/22/14 2200  aztreonam (AZACTAM) 2 g in dextrose 5 % 50 mL IVPB  Status:  Discontinued     2 g 100 mL/hr over 30 Minutes Intravenous 3 times per day 09/22/14 2128 09/24/14 0950   09/22/14 2130  vancomycin (VANCOCIN) 1,500 mg in sodium chloride 0.9 % 500 mL IVPB     1,500 mg 250 mL/hr over 120 Minutes Intravenous  Once 09/22/14 2120 09/23/14 0111       PHYSICAL EXAM: Vital signs in last 24 hours: Filed Vitals:   10/05/14 2213 10/05/14 2227 10/06/14 0634 10/06/14 0803  BP: 161/80  143/75   Pulse: 112 98 119   Temp: 98.4 F (36.9 C)  98.3 F (36.8 C)   TempSrc: Oral  Oral   Resp: 19 18 18    Height:      Weight:      SpO2: 100%  100% 98%    Weight change:  Filed Weights   09/29/14 0600 09/30/14 0500 10/01/14 0600  Weight: 71.4 kg (157 lb 6.5 oz) 71.6 kg (157 lb 13.6 oz) 70.4 kg (155 lb 3.3 oz)   Body mass index is 30.31 kg/(m^2).   Gen Exam: Awake and alert with clear speech.   Neck: Supple, No JVD.   Chest: B/L Clear.   CVS: S1 S2 Regular, no murmurs.  Abdomen: soft, BS +, non tender, non distended. Extremities: no edema, lower extremities warm to touch. Neurologic: Non Focal.  Extremely weak bilateral lower extremities patient says this has been the case for the last 2-3 weeks Skin: No Rash.   Wounds: N/A.   Intake/Output from previous  day:  Intake/Output Summary (Last 24 hours) at 10/06/14 1017 Last data filed at 10/05/14 2250  Gross per 24 hour  Intake   1055 ml  Output    150 ml  Net    905 ml     LAB RESULTS: CBC  Recent Labs Lab 09/30/14 0400 10/01/14 0641 10/02/14 0337 10/04/14 0342  WBC 11.9* 8.6 12.1* 6.2  HGB 9.5* 8.6* 9.0* 8.3*  HCT 28.3* 28.0* 31.3* 28.5*  PLT 114* 232 229 235  MCV 91.6 80.2 83.2 82.6  MCH 30.7 24.6* 23.9* 24.1*  MCHC 33.6 30.7 28.8* 29.1*  RDW 13.8 16.3* 15.4 14.5    Chemistries   Recent Labs Lab 09/30/14 1505 10/01/14 0402 10/02/14 0337 10/04/14 0342 10/04/14 0450  NA 143 141 142 137 143  K 4.4 3.4* 4.2 6.8* 3.8  CL 100 99 102 101 103  CO2 32 28 30 31  32  GLUCOSE 142* 136* 189* 402* 144*  BUN 33* 33* 18 6 6   CREATININE 0.57 0.56 0.36* 0.38* 0.38*  CALCIUM 9.6 9.5 9.8 8.9 8.9    CBG:  Recent Labs Lab 10/04/14 2349 10/05/14 0749 10/05/14 1601 10/05/14 2336 10/06/14 0756  GLUCAP 130* 143* 131* 125* 128*    GFR Estimated Creatinine Clearance: 58.1 ml/min (by C-G formula based on Cr of 0.38).  Coagulation profile No results found for this basename: INR, PROTIME,  in the last 168 hours  Cardiac Enzymes No results found for this basename: CK, CKMB, TROPONINI, MYOGLOBIN,  in the last 168 hours  No components found with this basename: POCBNP,  No results found for this basename: DDIMER,  in the last 72 hours No results found for this basename: HGBA1C,  in the last 72 hours No results found for this basename: CHOL, HDL, LDLCALC, TRIG, CHOLHDL, LDLDIRECT,  in the last 72 hours  Recent Labs  10/05/14 1435  TSH 1.130   No results found for this basename: VITAMINB12, FOLATE, FERRITIN, TIBC, IRON, RETICCTPCT,  in the last 72 hours No results found for this basename: LIPASE, AMYLASE,  in the last 72 hours  Urine Studies No results found for this basename: UACOL, UAPR, USPG, UPH, UTP, UGL, UKET, UBIL, UHGB, UNIT, UROB, ULEU, UEPI, UWBC, URBC, UBAC,  CAST, CRYS, UCOM, BILUA,  in the last 72 hours  MICROBIOLOGY: No results found for this or any previous visit (from the past 240 hour(s)).  RADIOLOGY STUDIES/RESULTS: Ct Head Wo Contrast  09/23/2014   CLINICAL DATA:  Cardiac arrest  EXAM: CT HEAD WITHOUT CONTRAST  TECHNIQUE: Contiguous axial images were obtained from the base of the skull through the vertex without intravenous contrast.  COMPARISON:  None.  FINDINGS: Focal area of low attenuation in the right basal ganglia compatible with either a chronic lacunar infarct or dilated prevascular space. The cerebral and cerebellar hemispheres are otherwise normal in attenuation and morphology. No evidence for acute brain infarct, hemorrhage or mass. No abnormal extra-axial fluid collections identified. The paranasal sinuses and mastoid air cells are clear. The skull is intact.  IMPRESSION: 1. No acute intracranial abnormalities. 2. 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   Electronically Signed   By: Kerby Moors M.D.   On: 09/23/2014 14:37   Dg Chest Port 1 View  10/02/2014   CLINICAL DATA:  Respiratory failure with hypoxia.  EXAM: PORTABLE CHEST - 1 VIEW  COMPARISON:  10/01/2014 and 09/30/2014  FINDINGS: PICC tip has been retracted and is now in the superior vena cava at the level of the azygos vein.  There is persistent bibasilar atelectasis, increased on the right. Heart size and vascularity are normal. No acute osseous abnormality.  IMPRESSION: 1. Increased atelectasis the right lung base. 2. PICC tip has been retracted and is now in the upper superior vena cava.   Electronically Signed   By: Rozetta Nunnery M.D.   On: 10/02/2014 08:05     Dg Swallowing Func-speech Pathology  10/03/2014   Assunta Curtis, CCC-SLP     10/03/2014  3:08 PM Objective Swallowing Evaluation: Modified Barium Swallowing Study   Patient  Details  Name: LOAN OGUIN MRN: [id number] Date of Birth: August 09, 1946  Today's Date: 10/03/2014  Time: 1430-1455 SLP Time Calculation (min): 25 min  Past Medical History:  Past Medical History  Diagnosis Date  . COPD (chronic obstructive pulmonary disease)   . Obesity hypoventilation syndrome   . OSA (obstructive sleep apnea)   . HTN (hypertension)   . Schizoaffective disorder   . GERD (gastroesophageal reflux disease)   . Chronic respiratory failure with hypoxia   . Hyperlipidemia   . Renal angiolipoma   . Hepatic cyst    Past Surgical History: History reviewed. No pertinent past  surgical history. HPI:  Pt with COPD and OSA/OHS, [facility]  [facility] after hospitalization for VDRF in setting RLL PNA.  Developed progressive resp failure 10/17 despite BiPAP, resulted  in resp arrest and PEA < 10 minutes. Pt intubated, stabilized and  transferred to [facility] ICU for further care.  ETT 10/17-10/26.   Bedside swallow eval morning of 10/28 with recs for MBS>    Assessment / Plan / Recommendation Clinical Impression  Dysphagia Diagnosis: Within Functional Limits  Clinical impression: Pt's swallow function improved from time of  clinical assessment in am, with absence of clinical signs notable  this morning (RN reports pt is more alert this afternoon).  MBS  revealed adequate mastication, no delay in timing of swallow  response, sufficient pharyngeal mobility, and consistent  laryngeal closure.  Swallow mechanism was taxed with large,  successive thin liquid boluses, but no aspiration was observed.    Recommend initiating a dysphagia 3 diet with thin liquids; meds  whole in puree.   Monitor VS during first few meals to ensure HT/DS28 remain stable  and that there are no recurring signs of poor PO toleration.  SLP  will f/u x1 to ensure consistency of toleration, then D/C if  swallow safety is maintained.     Treatment Recommendation  Therapy as outlined in treatment plan below    Diet Recommendation Dysphagia 3 (Mechanical Soft);Thin liquid   Liquid Administration via: Cup;Straw Medication  Administration: Whole meds with puree Compensations: Slow rate;Small sips/bites Postural Changes and/or Swallow Maneuvers: Seated upright 90  degrees    Other  Recommendations Oral Care Recommendations: Oral care BID   Follow Up Recommendations  None    Frequency and Duration min 1 x/week  1 week     General Date of Onset: 09/22/14 HPI: Pt with COPD and OSA/OHS, [facility] from  [facility] after hospitalization for VDRF in setting RLL PNA.  Developed progressive resp failure 10/17 despite BiPAP, resulted  in resp arrest and PEA < 10 minutes. Pt intubated, stabilized and  transferred to [facility] ICU for further care.  ETT 10/17-10/26.  Type of Study: Modified Barium Swallowing Study Diet Prior to this Study: NPO Temperature Spikes Noted: No Respiratory Status: Room air History of Recent Intubation: Yes Length of Intubations (days): 9 days Date extubated: 10/01/14 Behavior/Cognition: Alert;Cooperative;Pleasant mood Oral Cavity - Dentition: Adequate natural dentition Oral Motor / Sensory Function: Within functional limits Self-Feeding Abilities: Able to feed self;Needs set up Patient Positioning: Upright in chair Baseline Vocal Quality: Hoarse Volitional Cough: Weak Volitional Swallow: Able to elicit Anatomy: Within functional limits Pharyngeal Secretions: Not observed secondary MBS    Reason for Referral     Oral Phase Oral Preparation/Oral Phase Oral Phase: WFL   Pharyngeal Phase Pharyngeal Phase Pharyngeal Phase: Impaired Pharyngeal - Nectar Pharyngeal - Nectar Straw: Within functional limits Pharyngeal - Thin Pharyngeal -  Thin Straw: Penetration/Aspiration during swallow Penetration/Aspiration details (thin straw): Material enters  airway, remains ABOVE vocal cords then ejected out Pharyngeal - Solids Pharyngeal - Puree: Within functional limits Pharyngeal - Mechanical Soft: Within functional limits  Cervical Esophageal Phase   Amanda L. [place], [place] CCC/SLP Pager [phone]     Cervical Esophageal Phase  Cervical Esophageal Phase: Darryll Capers         Juan Quam Laurice 10/03/2014, 3:07 PM     Thurnell Lose, MD  [facility] Pager:[phone]  If 7PM-7AM, please contact night-coverage [email or web address] Password [email or web address] 10/06/2014, 10:17 AM   LOS: 14 days

## 2014-10-07 ENCOUNTER — Encounter (HOSPITAL_COMMUNITY): Payer: Self-pay

## 2014-10-07 LAB — BLOOD GAS, ARTERIAL
Acid-Base Excess: 4.9 mmol/L — ABNORMAL HIGH (ref 0.0–2.0)
Bicarbonate: 33.8 mEq/L — ABNORMAL HIGH (ref 20.0–24.0)
DRAWN BY: 347621
FIO2: 100 %
O2 Saturation: 99.3 %
PATIENT TEMPERATURE: 97.9
TCO2: 37.1 mmol/L (ref 0–100)
pCO2 arterial: 105 mmHg (ref 35.0–45.0)
pH, Arterial: 7.131 — CL (ref 7.350–7.450)
pO2, Arterial: 288 mmHg — ABNORMAL HIGH (ref 80.0–100.0)

## 2014-10-07 LAB — GLUCOSE, CAPILLARY
GLUCOSE-CAPILLARY: 92 mg/dL (ref 70–99)
Glucose-Capillary: 109 mg/dL — ABNORMAL HIGH (ref 70–99)
Glucose-Capillary: 153 mg/dL — ABNORMAL HIGH (ref 70–99)

## 2014-10-07 MED ORDER — ENSURE COMPLETE PO LIQD: 237.0000 mL | Freq: Two times a day (BID) | ORAL | Status: AC

## 2014-10-07 MED ORDER — IPRATROPIUM-ALBUTEROL 0.5-2.5 (3) MG/3ML IN SOLN: 3.0000 mL | RESPIRATORY_TRACT | Status: DC | PRN

## 2014-10-07 MED ORDER — SODIUM CHLORIDE 0.9 % IV SOLN
INTRAVENOUS | Status: AC
  Administered 2014-10-08 (×2): via INTRAVENOUS

## 2014-10-07 MED ORDER — SODIUM CHLORIDE 0.9 % IV BOLUS (SEPSIS)
250.0000 mL | Freq: Once | INTRAVENOUS | Status: AC
  Administered 2014-10-07: 250 mL via INTRAVENOUS

## 2014-10-07 MED ORDER — METOPROLOL TARTRATE 50 MG PO TABS
50.0000 mg | ORAL_TABLET | Freq: Two times a day (BID) | ORAL | Status: DC
  Administered 2014-10-07 (×2): 50 mg via ORAL
  Filled 2014-10-07 (×2): qty 1

## 2014-10-07 MED ORDER — RISPERIDONE 0.5 MG PO TABS
0.5000 mg | ORAL_TABLET | Freq: Every day | ORAL | Status: DC
  Administered 2014-10-08 – 2014-10-10 (×3): 0.5 mg via ORAL
  Filled 2014-10-07 (×5): qty 1

## 2014-10-07 MED ORDER — SODIUM CHLORIDE 0.9 % IJ SOLN
10.0000 mL | INTRAMUSCULAR | Status: DC | PRN
  Administered 2014-10-07: 10 mL
  Filled 2014-10-07: qty 40

## 2014-10-07 NOTE — Progress Notes (Signed)
Was paged at 1.45 the patient was extremely lethargic, unable to respond to verbal commands and not opening eyes.  Came to see the patient, she is in bed with eyes open, answered all my questions and followed commands, moving all 4 extremities, no headache chest pain or belly pain. She denies any shortness of breath.  He is severely deconditioned and was probably slightly exhausted from sitting up in the chair for several hours this morning,we will monitor closely. Gentle hydration and monitor.

## 2014-10-07 NOTE — Progress Notes (Signed)
Called to assist with care of patient with decrease in LOC since this am. Patient was found to have an O2 saturation of 48% by RT. No acute respiratory distress noted. Patient with shallow respirations and decreased breath sounds bilateral. Heart sounds regular. Skin is warm and dry. Cap refill< 3sec. Due to respiratory status, an ABG was ordered. On-call provider was paged. Patient was moved to 2S for immediate intubation. Patients daughter, Donaciano Eva, was made aware via telephone of patients condition and treatment measures of intubation.

## 2014-10-07 NOTE — Progress Notes (Signed)
CSW spoke with [facility] in [place] re: pt.  Pt was d/c from their facility on 09/14/14 into her daughter's care.  Nurses at the facility are unsure whether/not pt can be accepted back there and referred CSW to their Admissions Coordinator who won't be in until 09/07/14.  CSW will f/u with pt re: another facility choice and facilitate d/c accordingly.

## 2014-10-07 NOTE — Progress Notes (Addendum)
ABG results 7.13 CO2 105 O2 288 Hco3 33.  Patient placed on 40% Fio2 venturi mask.

## 2014-10-07 NOTE — Progress Notes (Signed)
Pt has been very tired/sleepy/lethargic most of the day. Pt had an episode when she woke up this morning where she was confused and attempted to pull off her oxygen and IV lines. Other than that one episode pt has become progressively lethargic. Pt so sleepy that we could not get her to eat lunch. Pt can respond with nodding her head but hasn't been talking.

## 2014-10-07 NOTE — Progress Notes (Signed)
Spoke with pts daughter, Peter Congo, and she states she does not want her mother to go back to [place] in [place].  She would prefer [facility] here, [place] or [facility] in [place].

## 2014-10-07 NOTE — Progress Notes (Signed)
Patient started on BIPAP 20/6 FIO2 40%.

## 2014-10-07 NOTE — Progress Notes (Signed)
Upon arrival to patients room, patient off CPAP, on room air.  Placed patient on CPAP via nasal mask, with 2 lpm O2.  Patient SPO2 48%.  Increased Fio2 to 6 lpm. RN in room at this time.  Spo2 increased fairly rapid and then maintained at 80%.  Placed patient on NRB, Rapid Response called. Patient lethargic.  ABG done.

## 2014-10-07 NOTE — Progress Notes (Signed)
PATIENT DETAILS Name: Debbie Pena Age: 68 y.o. Sex: female Date of Birth: Feb 14, 1946 Admit Date: 09/22/2014 Admitting Physician Collene Gobble, MD [id number], Provider, MD  Brief summary :  Pt with COPD and OSA/OHS, transferred to [facility] 10[facility]16 from [facility] after hospitalization for VDRF in setting RLL PNA. Developed progressive resp failure 10/17 despite BiPAP, resulted in resp arrest and PEA < 10 minutes. Pt intubated, stabilized and transferred to [facility] ICU for further care. Extubated on 10/26, and subsequently transferred out of the ICU. [facility] assumed care on 10/29   Subjective:  Awake and alert. Denies any headache, no chest abdominal pain, no shortness of breath, confirms home use of oxygen. Extreme Bilateral lower extremity weakness since ongoing for the last 2-3 weeks.    Assessment/Plan:      Acute respiratory failure with hypoxia and hypercarbia: Secondary to COPD exacerbation, OSA/OHS. Intubated on 10/17 following PA arrest, extubated on 10/26. Doing much better, continue with O2.       PEA arrest:secondary to above. Intubated on 10/17 following PA arrest, extubated on 10/26. stable Echo EF 60% and no wall motion abnormality.       HCAP: Afebrile. No longer on any antimicrobial therapy (see below for antibiotic data). Blood culture negative.        Tachycardia: Suspect secondary to bronchodilators, critical illness. However has had prolonged hospitalization, negative lower extremity Doppler. Gently hydrate, CT angiogram chest, echo gram, TSH, EKG all nonacute and unremarkable. Continue on Lovenox for VTE prophylaxis. Heart rate much improved with supportive care.      HTN - stable on beta blocker continue.       Encephalopathy: Suspect ICU delirium, resolving with supportive care. Continue with Risperdal, minimize benzodiazepines.        AKI: Resolved       Dysphagia:Suspect secondary to deconditioning/acute illness: Now on dysphagia 3 diet.  Speech following.         History of COPD with chronic respiratory failure, on home O2: Currently stable, continue Brovana and budesonide. Albuterol as     needed.        Severe deconditioning: Secondary to acute illness. PT eval, will need SNF on discharge per S work no rms yet will need a new facilty.     Schizoaffective disorder: Stable, risperidone resumed.      GERD: Continue PPI      Dyslipidemia: Resumed statins       Disposition:  SNF S work to find a new facility, told no beds for now.    Antibiotics: Levaquin 10/9 >> 10/16  Azithro 10/17 >> 10/18  Vanco, 10/17 >> 10/19  Aztreonam 10/17 >> 10/19  DVT Prophylaxis: Prophylactic Lovenox   Code Status: Full code   Family Communication Daughter today bedside explained to expect mild dilerium  Procedures:   TTE  - Left ventricle: The cavity size was normal. Wall thickness was increased in a pattern of mild LVH. The estimated ejection fraction was 60%. Wall motion was normal; there were no regional wall motion abnormalities. - Right ventricle: The cavity size was normal. Systolic function was mildly reduced.   CONSULTS:  pulmonary/intensive care  Time spent 40 minutes     MEDICATIONS: Scheduled Meds: . antiseptic oral rinse  7 mL Mouth Rinse q12n4p  . arformoterol  15 mcg Nebulization BID  . aspirin  81 mg Oral Daily  . atorvastatin  20 mg Oral q1800  . budesonide  0.25 mg Nebulization BID  . chlorhexidine  15 mL Mouth Rinse BID  . enoxaparin (LOVENOX) injection  40 mg Subcutaneous Q24H  . feeding supplement (ENSURE COMPLETE)  237 mL Oral BID BM  . metoprolol tartrate  50 mg Oral BID  . pantoprazole  40 mg Oral Q1200  . risperiDONE  2 mg Oral QHS  . sodium chloride  500 mL Intravenous Once  . sodium chloride  10-40 mL Intracatheter Q12H   Continuous Infusions:   PRN Meds:.albuterol, sodium chloride, sodium chloride  Antibiotics: Anti-infectives    Start     Dose/Rate Route Frequency  Ordered Stop   09/23/14 1000  vancomycin (VANCOCIN) IVPB 1000 mg/200 mL premix  Status:  Discontinued     1,000 mg200 mL/hr over 60 Minutes Intravenous Every 12 hours 09/22/14 2123 09/24/14 0950   09/22/14 2200  azithromycin (ZITHROMAX) 500 mg in dextrose 5 % 250 mL IVPB  Status:  Discontinued     500 mg250 mL/hr over 60 Minutes Intravenous Every 24 hours 09/22/14 2013 09/23/14 1114   09/22/14 2200  vancomycin (VANCOCIN) 1,500 mg in sodium chloride 0.9 % 500 mL IVPB     1,500 mg250 mL/hr over 120 Minutes Intravenous  Once 09/22/14 2123 09/23/14 0028   09/22/14 2200  ceFEPIme (MAXIPIME) 2 g in dextrose 5 % 50 mL IVPB  Status:  Discontinued     2 g100 mL/hr over 30 Minutes Intravenous Every 12 hours 09/22/14 2123 09/22/14 2128   09/22/14 2200  aztreonam (AZACTAM) 2 g in dextrose 5 % 50 mL IVPB  Status:  Discontinued     2 g100 mL/hr over 30 Minutes Intravenous 3 times per day 09/22/14 2128 09/24/14 0950   09/22/14 2130  vancomycin (VANCOCIN) 1,500 mg in sodium chloride 0.9 % 500 mL IVPB     1,500 mg250 mL/hr over 120 Minutes Intravenous  Once 09/22/14 2120 09/23/14 0111       PHYSICAL EXAM: Vital signs in last 24 hours: Filed Vitals:   10/06/14 2136 10/07/14 0602 10/07/14 0802 10/07/14 0900  BP: 148/76 80/63  136/66  Pulse: 106 101  99  Temp: 98.4 F (36.9 C) 98.3 F (36.8 C)  98.6 F (37 C)  TempSrc:    Oral  Resp: 18 18  20   Height:      Weight:      SpO2: 98% 100% 97% 94%    Weight change:  Filed Weights   09/29/14 0600 09/30/14 0500 10/01/14 0600  Weight: 71.4 kg (157 lb 6.5 oz) 71.6 kg (157 lb 13.6 oz) 70.4 kg (155 lb 3.3 oz)   Body mass index is 30.31 kg/(m^2).   Gen Exam: Awake and alert with clear speech.   Neck: Supple, No JVD.   Chest: B/L Clear.   CVS: S1 S2 Regular, no murmurs.  Abdomen: soft, BS +, non tender, non distended. Extremities: no edema, lower extremities warm to touch. Neurologic: Non Focal.  Extremely weak bilateral lower extremities patient says  this has been the case for the last 2-3 weeks Skin: No Rash.   Wounds: N/A.   Intake/Output from previous day:  Intake/Output Summary (Last 24 hours) at 10/07/14 1237 Last data filed at 10/06/14 1800  Gross per 24 hour  Intake    180 ml  Output      0 ml  Net    180 ml     LAB RESULTS: CBC  Recent Labs Lab 10/01/14 0641 10/02/14 0337 10/04/14 0342  WBC 8.6 12.1* 6.2  HGB 8.6* 9.0* 8.3*  HCT 28.0* 31.3* 28.5*  PLT 232 229 235  MCV 80.2 83.2 82.6  MCH 24.6* 23.9* 24.1*  MCHC 30.7 28.8* 29.1*  RDW 16.3* 15.4 14.5    Chemistries   Recent Labs Lab 09/30/14 1505 10/01/14 0402 10/02/14 0337 10/04/14 0342 10/04/14 0450  NA 143 141 142 137 143  K 4.4 3.4* 4.2 6.8* 3.8  CL 100 99 102 101 103  CO2 32 28 30 31  32  GLUCOSE 142* 136* 189* 402* 144*  BUN 33* 33* 18 6 6   CREATININE 0.57 0.56 0.36* 0.38* 0.38*  CALCIUM 9.6 9.5 9.8 8.9 8.9    CBG:  Recent Labs Lab 10/05/14 2336 10/06/14 0756 10/06/14 1644 10/07/14 0020 10/07/14 0810  GLUCAP 125* 128* 142* 109* 92    GFR Estimated Creatinine Clearance: 58.1 mL/min (by C-G formula based on Cr of 0.38).  Coagulation profile No results for input(s): INR, PROTIME in the last 168 hours.  Cardiac Enzymes No results for input(s): CKMB, TROPONINI, MYOGLOBIN in the last 168 hours.  Invalid input(s): CK  Invalid input(s): POCBNP No results for input(s): DDIMER in the last 72 hours. No results for input(s): HGBA1C in the last 72 hours. No results for input(s): CHOL, HDL, LDLCALC, TRIG, CHOLHDL, LDLDIRECT in the last 72 hours.  Recent Labs  10/05/14 1435  TSH 1.130   No results for input(s): VITAMINB12, FOLATE, FERRITIN, TIBC, IRON, RETICCTPCT in the last 72 hours. No results for input(s): LIPASE, AMYLASE in the last 72 hours.  Urine Studies No results for input(s): UHGB, CRYS in the last 72 hours.  Invalid input(s): UACOL, UAPR, USPG, UPH, UTP, UGL, UKET, UBIL, UNIT, UROB, ULEU, UEPI, UWBC, URBC, UBAC,  CAST, UCOM, BILUA  MICROBIOLOGY: No results found for this or any previous visit (from the past 240 hour(s)).  RADIOLOGY STUDIES/RESULTS: Ct Head Wo Contrast  09/23/2014   CLINICAL DATA:  Cardiac arrest  EXAM: CT HEAD WITHOUT CONTRAST  TECHNIQUE: Contiguous axial images were obtained from the base of the skull through the vertex without intravenous contrast.  COMPARISON:  None.  FINDINGS: Focal area of low attenuation in the right basal ganglia compatible with either a chronic lacunar infarct or dilated prevascular space. The cerebral and cerebellar hemispheres are otherwise normal in attenuation and morphology. No evidence for acute brain infarct, hemorrhage or mass. No abnormal extra-axial fluid collections identified. The paranasal sinuses and mastoid air cells are clear. The skull is intact.  IMPRESSION: 1. No acute intracranial abnormalities. 2. 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   Electronically Signed   By: Kerby Moors M.D.   On: 09/23/2014 14:37   Dg Chest Port 1 View  10/02/2014   CLINICAL DATA:  Respiratory failure with hypoxia.  EXAM: PORTABLE CHEST - 1 VIEW  COMPARISON:  10/01/2014 and 09/30/2014  FINDINGS: PICC tip has been retracted and is now in the superior vena cava at the level of the azygos vein.  There is persistent bibasilar atelectasis, increased on the right. Heart size and vascularity are normal. No acute osseous abnormality.  IMPRESSION: 1. Increased atelectasis the right lung base. 2. PICC tip has been retracted and is now in the upper superior vena cava.   Electronically Signed   By: Rozetta Nunnery M.D.   On: 10/02/2014 08:05     Dg Swallowing Func-speech Pathology  10/03/2014   Assunta Curtis, [place]     10/03/2014  3:08 PM Objective Swallowing Evaluation: Modified Barium Swallowing Study   Patient Details  Name: Debbie Pena MRN: [id number] Date of  Birth: 12-31-1945  Today's Date: 10/03/2014 Time: 0254-2706 SLP Time  Calculation (min): 25 min  Past Medical History:  Past Medical History  Diagnosis Date  . COPD (chronic obstructive pulmonary disease)   . Obesity hypoventilation syndrome   . OSA (obstructive sleep apnea)   . HTN (hypertension)   . Schizoaffective disorder   . GERD (gastroesophageal reflux disease)   . Chronic respiratory failure with hypoxia   . Hyperlipidemia   . Renal angiolipoma   . Hepatic cyst    Past Surgical History: History reviewed. No pertinent past  surgical history. HPI:  Pt with COPD and OSA/OHS, [facility] from  [facility] after hospitalization for VDRF in setting RLL PNA.  Developed progressive resp failure 10/17 despite BiPAP, resulted  in resp arrest and PEA < 10 minutes. Pt intubated, stabilized and  transferred to [facility] ICU for further care.  ETT 10/17-10/26.   Bedside swallow eval morning of 10/28 with recs for MBS>    Assessment / Plan / Recommendation Clinical Impression  Dysphagia Diagnosis: Within Functional Limits  Clinical impression: Pt's swallow function improved from time of  clinical assessment in am, with absence of clinical signs notable  this morning (RN reports pt is more alert this afternoon).  MBS  revealed adequate mastication, no delay in timing of swallow  response, sufficient pharyngeal mobility, and consistent  laryngeal closure.  Swallow mechanism was taxed with large,  successive thin liquid boluses, but no aspiration was observed.    Recommend initiating a dysphagia 3 diet with thin liquids; meds  whole in puree.   Monitor VS during first few meals to ensure CB/JS28 remain stable  and that there are no recurring signs of poor PO toleration.  SLP  will f/u x1 to ensure consistency of toleration, then D/C if  swallow safety is maintained.     Treatment Recommendation  Therapy as outlined in treatment plan below    Diet Recommendation Dysphagia 3 (Mechanical Soft);Thin liquid   Liquid Administration via: Cup;Straw Medication Administration: Whole meds with  puree Compensations: Slow rate;Small sips/bites Postural Changes and/or Swallow Maneuvers: Seated upright 90  degrees    Other  Recommendations Oral Care Recommendations: Oral care BID   Follow Up Recommendations  None    Frequency and Duration min 1 x/week  1 week     General Date of Onset: 09/22/14 HPI: Pt with COPD and OSA/OHS, transferred to [facility] from  [facility] after hospitalization for VDRF in setting RLL PNA.  Developed progressive resp failure 10/17 despite BiPAP, resulted  in resp arrest and PEA < 10 minutes. Pt intubated, stabilized and  transferred to [facility] ICU for further care.  ETT 10/17-10/26.  Type of Study: Modified Barium Swallowing Study Diet Prior to this Study: NPO Temperature Spikes Noted: No Respiratory Status: Room air History of Recent Intubation: Yes Length of Intubations (days): 9 days Date extubated: 10/01/14 Behavior/Cognition: Alert;Cooperative;Pleasant mood Oral Cavity - Dentition: Adequate natural dentition Oral Motor / Sensory Function: Within functional limits Self-Feeding Abilities: Able to feed self;Needs set up Patient Positioning: Upright in chair Baseline Vocal Quality: Hoarse Volitional Cough: Weak Volitional Swallow: Able to elicit Anatomy: Within functional limits Pharyngeal Secretions: Not observed secondary MBS    Reason for Referral     Oral Phase Oral Preparation/Oral Phase Oral Phase: WFL   Pharyngeal Phase Pharyngeal Phase Pharyngeal Phase: Impaired Pharyngeal - Nectar Pharyngeal - Nectar Straw: Within functional limits Pharyngeal - Thin Pharyngeal - Thin Straw: Penetration/Aspiration during swallow Penetration/Aspiration details (thin straw): Material  enters  airway, remains ABOVE vocal cords then ejected out Pharyngeal - Solids Pharyngeal - Puree: Within functional limits Pharyngeal - Mechanical Soft: Within functional limits  Cervical Esophageal Phase   Amanda L. B[place], [place] CCC/SLP Pager [phone]     Cervical Esophageal Phase Cervical Esophageal Phase: Darryll Capers          Juan Quam Laurice 10/03/2014, 3:07 PM     Thurnell Lose, MD  [facility] Pager:[phone]  If 7PM-7AM, please contact night-coverage [email or web address] Password [email or web address] 10/07/2014, 12:37 PM   LOS: 15 days

## 2014-10-07 NOTE — Progress Notes (Addendum)
Pt's daughter expressed concerns about pt's increase in confusion. Pt was oriented to self, with confusion to current location and situation, appeared restless and a little irritated. Nurse re-oriented to current location and situation. Vital signs stable and O2 sat remaining >95% on 3LNC. MD on call made aware of pt's increase in confusion and restlessness.

## 2014-10-08 ENCOUNTER — Inpatient Hospital Stay (HOSPITAL_COMMUNITY): Payer: Medicare Other

## 2014-10-08 DIAGNOSIS — J9601 Acute respiratory failure with hypoxia: Secondary | ICD-10-CM

## 2014-10-08 LAB — GLUCOSE, CAPILLARY
GLUCOSE-CAPILLARY: 107 mg/dL — AB (ref 70–99)
GLUCOSE-CAPILLARY: 118 mg/dL — AB (ref 70–99)
Glucose-Capillary: 117 mg/dL — ABNORMAL HIGH (ref 70–99)
Glucose-Capillary: 116 mg/dL — ABNORMAL HIGH (ref 70–99)
Glucose-Capillary: 94 mg/dL (ref 70–99)
Glucose-Capillary: 102 mg/dL — ABNORMAL HIGH (ref 70–99)

## 2014-10-08 LAB — POCT I-STAT 3, ART BLOOD GAS (G3+)
Acid-Base Excess: 11 mmol/L — ABNORMAL HIGH (ref 0.0–2.0)
Bicarbonate: 36.2 mEq/L — ABNORMAL HIGH (ref 20.0–24.0)
O2 SAT: 99 %
PCO2 ART: 54 mmHg — AB (ref 35.0–45.0)
PO2 ART: 154 mmHg — AB (ref 80.0–100.0)
Patient temperature: 99.3
TCO2: 38 mmol/L (ref 0–100)
pH, Arterial: 7.436 (ref 7.350–7.450)

## 2014-10-08 LAB — URINALYSIS, ROUTINE W REFLEX MICROSCOPIC
Bilirubin Urine: NEGATIVE
Glucose, UA: NEGATIVE mg/dL
Hgb urine dipstick: NEGATIVE
Ketones, ur: NEGATIVE mg/dL
NITRITE: NEGATIVE
Protein, ur: 30 mg/dL — AB
SPECIFIC GRAVITY, URINE: 1.021 (ref 1.005–1.030)
UROBILINOGEN UA: 0.2 mg/dL (ref 0.0–1.0)
pH: 5 (ref 5.0–8.0)

## 2014-10-08 LAB — CBC
HCT: 29.6 % — ABNORMAL LOW (ref 36.0–46.0)
HEMOGLOBIN: 8.4 g/dL — AB (ref 12.0–15.0)
MCH: 24.3 pg — ABNORMAL LOW (ref 26.0–34.0)
MCHC: 28.4 g/dL — AB (ref 30.0–36.0)
MCV: 85.5 fL (ref 78.0–100.0)
Platelets: 264 10*3/uL (ref 150–400)
RBC: 3.46 MIL/uL — ABNORMAL LOW (ref 3.87–5.11)
RDW: 14.5 % (ref 11.5–15.5)
WBC: 8.4 10*3/uL (ref 4.0–10.5)

## 2014-10-08 LAB — COMPREHENSIVE METABOLIC PANEL
ALK PHOS: 92 U/L (ref 39–117)
ALT: 16 U/L (ref 0–35)
AST: 11 U/L (ref 0–37)
Albumin: 2.7 g/dL — ABNORMAL LOW (ref 3.5–5.2)
Anion gap: 9 (ref 5–15)
BUN: 8 mg/dL (ref 6–23)
CO2: 32 mEq/L (ref 19–32)
Calcium: 9.1 mg/dL (ref 8.4–10.5)
Chloride: 103 mEq/L (ref 96–112)
Creatinine, Ser: 0.41 mg/dL — ABNORMAL LOW (ref 0.50–1.10)
GFR calc Af Amer: >90 mL/min (ref >90)
GFR calc non Af Amer: >90 mL/min (ref >90)
Glucose, Bld: 151 mg/dL — ABNORMAL HIGH (ref 70–99)
POTASSIUM: 3.9 meq/L (ref 3.7–5.3)
Sodium: 144 mEq/L (ref 137–147)
Total Bilirubin: <0.2 mg/dL — ABNORMAL LOW (ref 0.3–1.2)
Total Protein: 5.8 g/dL — ABNORMAL LOW (ref 6.0–8.3)

## 2014-10-08 LAB — URINE MICROSCOPIC-ADD ON

## 2014-10-08 LAB — HEMOGLOBIN A1C
Hgb A1c MFr Bld: 6.5 % — ABNORMAL HIGH (ref <5.7)
MEAN PLASMA GLUCOSE: 140 mg/dL — AB (ref <117)

## 2014-10-08 MED ORDER — INSULIN ASPART 100 UNIT/ML ~~LOC~~ SOLN: 0.0000 [IU] | Freq: Three times a day (TID) | SUBCUTANEOUS | Status: DC

## 2014-10-08 MED ORDER — INSULIN ASPART 100 UNIT/ML ~~LOC~~ SOLN
0.0000 [IU] | SUBCUTANEOUS | Status: DC
Start: 2014-10-08 — End: 2014-10-11
  Administered 2014-10-09 (×2): 2 [IU] via SUBCUTANEOUS
  Administered 2014-10-09: 3 [IU] via SUBCUTANEOUS
  Administered 2014-10-09 – 2014-10-11 (×4): 2 [IU] via SUBCUTANEOUS
  Administered 2014-10-11: 3 [IU] via SUBCUTANEOUS
  Administered 2014-10-11: 2 [IU] via SUBCUTANEOUS

## 2014-10-08 MED ORDER — NOREPINEPHRINE BITARTRATE 1 MG/ML IV SOLN
0.0000 ug/min | INTRAVENOUS | Status: DC
  Administered 2014-10-08: 5 ug/min via INTRAVENOUS
  Filled 2014-10-08: qty 4

## 2014-10-08 MED ORDER — VITAL HIGH PROTEIN PO LIQD
1000.0000 mL | ORAL | Status: DC
  Filled 2014-10-08 (×2): qty 1000

## 2014-10-08 MED ORDER — METHYLPREDNISOLONE SODIUM SUCC 125 MG IJ SOLR
125.0000 mg | Freq: Once | INTRAMUSCULAR | Status: AC
  Administered 2014-10-08: 125 mg via INTRAVENOUS
  Filled 2014-10-08: qty 2

## 2014-10-08 MED ORDER — SODIUM CHLORIDE 0.9 % IV SOLN
0.0000 ug/h | INTRAVENOUS | Status: DC
  Administered 2014-10-08: 75 ug/h via INTRAVENOUS
  Administered 2014-10-09: 60 ug/h via INTRAVENOUS
  Administered 2014-10-10: 125 ug/h via INTRAVENOUS
  Filled 2014-10-08 (×4): qty 50

## 2014-10-08 MED ORDER — MIDAZOLAM HCL 2 MG/2ML IJ SOLN
3.0000 mg | Freq: Once | INTRAMUSCULAR | Status: AC
  Administered 2014-10-08: 3 mg via INTRAVENOUS

## 2014-10-08 MED ORDER — CETYLPYRIDINIUM CHLORIDE 0.05 % MT LIQD
7.0000 mL | Freq: Four times a day (QID) | OROMUCOSAL | Status: DC
  Administered 2014-10-08 – 2014-10-11 (×14): 7 mL via OROMUCOSAL

## 2014-10-08 MED ORDER — IPRATROPIUM-ALBUTEROL 0.5-2.5 (3) MG/3ML IN SOLN
3.0000 mL | Freq: Four times a day (QID) | RESPIRATORY_TRACT | Status: DC
  Administered 2014-10-08 – 2014-10-11 (×12): 3 mL via RESPIRATORY_TRACT
  Filled 2014-10-08 (×12): qty 3

## 2014-10-08 MED ORDER — SODIUM CHLORIDE 0.9 % IV SOLN
INTRAVENOUS | Status: DC
  Administered 2014-10-08: 15:00:00 via INTRAVENOUS

## 2014-10-08 MED ORDER — PANTOPRAZOLE SODIUM 40 MG PO PACK
40.0000 mg | PACK | ORAL | Status: DC
  Administered 2014-10-08 – 2014-10-11 (×4): 40 mg
  Filled 2014-10-08 (×4): qty 20

## 2014-10-08 MED ORDER — IPRATROPIUM-ALBUTEROL 0.5-2.5 (3) MG/3ML IN SOLN
3.0000 mL | RESPIRATORY_TRACT | Status: DC
  Administered 2014-10-08 (×2): 3 mL via RESPIRATORY_TRACT
  Filled 2014-10-08 (×2): qty 3

## 2014-10-08 MED ORDER — PROPOFOL 10 MG/ML IV EMUL: 100.0000 mg | Freq: Once | INTRAVENOUS | Status: DC

## 2014-10-08 MED ORDER — FENTANYL CITRATE 0.05 MG/ML IJ SOLN
50.0000 ug | Freq: Once | INTRAMUSCULAR | Status: AC
  Administered 2014-10-08: 50 ug via INTRAVENOUS

## 2014-10-08 MED ORDER — VITAL AF 1.2 CAL PO LIQD
1000.0000 mL | ORAL | Status: DC
  Administered 2014-10-08 – 2014-10-10 (×4): 1000 mL
  Filled 2014-10-08 (×5): qty 1000

## 2014-10-08 MED ORDER — FENTANYL BOLUS VIA INFUSION
25.0000 ug | INTRAVENOUS | Status: DC | PRN
  Filled 2014-10-08: qty 50

## 2014-10-08 MED ORDER — PRO-STAT SUGAR FREE PO LIQD
30.0000 mL | Freq: Two times a day (BID) | ORAL | Status: DC
  Administered 2014-10-08 – 2014-10-11 (×6): 30 mL
  Filled 2014-10-08 (×7): qty 30

## 2014-10-08 NOTE — Progress Notes (Addendum)
Report given to Ebony Hail, RN on [employer]. Pt in route of transfer.

## 2014-10-08 NOTE — Progress Notes (Signed)
NUTRITION CONSULT/FOLLOW UP  Intervention:   Initiate Vital AF 1.2 formula at 15 ml/hr and increase by 10 ml every 4 hours to goal rate of 45 ml/hr with Prostat liquid protein 30 ml BID via tube to provide 1496 kcals, 111 gm protein, 876 ml of free water RD to follow for nutrition care plan  Nutrition Dx:   Inadequate oral intake now related to inability to eat as evidenced by NPO status, ongoing  Goal:   Pt to meet >/= 90% of their estimated nutrition needs, currently unmet  Monitor:   TF regimen & tolerance, respiratory status, weight, labs, I/O's  Assessment:   68 y.o. Female with COPD and OSA/OHS, transferred [facility] from [facility] after hospitalization for VDRF in setting RLL PNA. Developed progressive resp failure 10/17 despite BiPAP, resulted in resp arrest and PEA < 10 minutes.   Pt developed respiratory arrest 11/1; transferred from 6N-Surgical to 2S-SICU.  Patient is currently intubated on ventilator support -- OGT in place MV: 6.8 L/min Temp (24hrs), Avg:98.6 F (37 C), Min:97.9 F (36.6 C), Max:99.3 F (37.4 C)   RD consulted for TF initiation & management.  Pt s/p MBSS 10/28; previously on a Dys 3, thin liquid diet.  Height: Ht Readings from Last 1 Encounters:  09/22/14 5' (1.524 m)    Weight Status:   Wt Readings from Last 1 Encounters:  10/01/14 155 lb 3.3 oz (70.4 kg)    Re-estimated needs:  Kcal: 1350-1500 Protein: 105-115 gm  Fluid per MD  Skin: Intact  Diet Order: NPO   Intake/Output Summary (Last 24 hours) at 10/08/14 1224 Last data filed at 10/08/14 1200  Gross per 24 hour  Intake  952.9 ml  Output    465 ml  Net  487.9 ml    Labs:   Recent Labs Lab 10/04/14 0342 10/04/14 0450 10/08/14 0350  NA 137 143 144  K 6.8* 3.8 3.9  CL 101 103 103  CO2 31 32 32  BUN 6 6 8   CREATININE 0.38* 0.38* 0.41*  CALCIUM 8.9 8.9 9.1  GLUCOSE 402* 144* 151*    CBG (last 3)   Recent Labs  10/08/14 0154 10/08/14 0742  10/08/14 1201  GLUCAP 118* 117* 116*    Scheduled Meds: . antiseptic oral rinse  7 mL Mouth Rinse QID  . aspirin  81 mg Oral Daily  . atorvastatin  20 mg Oral q1800  . chlorhexidine  15 mL Mouth Rinse BID  . enoxaparin (LOVENOX) injection  40 mg Subcutaneous Q24H  . feeding supplement (VITAL HIGH PROTEIN)  1,000 mL Per Tube Q24H  . insulin aspart  0-15 Units Subcutaneous 6 times per day  . ipratropium-albuterol  3 mL Nebulization Q6H  . pantoprazole sodium  40 mg Per Tube Q24H  . propofol  100 mg Intravenous Once  . risperiDONE  0.5 mg Oral QHS  . sodium chloride  10-40 mL Intracatheter Q12H    Continuous Infusions: . sodium chloride 50 mL/hr at 10/08/14 1200  . fentaNYL infusion INTRAVENOUS 50 mcg/hr (10/08/14 1200)    Arthur Holms, RD, LDN Pager #: [phone] After-Hours Pager #: [phone]

## 2014-10-08 NOTE — Progress Notes (Signed)
PULMONARY / CRITICAL CARE MEDICINE   Name: Debbie Pena MRN: [id number] DOB: Dec 13, 1945    ADMISSION DATE:  09/22/2014  CHIEF COMPLAINT:  Short of breath  INITIAL PRESENTATION:  68 yo female former smoker transferred from [facility] with VDRF and RLL PNA complicated by PEA with hx of COPD, OSA/OHS.  Developed respiratory arrest 11/01 and transfer back to ICU.  STUDIES:  10/13 CT chest >> RLL infiltrate, B small effusions, L renal angiolipoma 10/18 CT head >> Chronic R BG lacunar infarct vs dilated prevascular space. No evidence CVA or global anoxia 10/29 Doppler legs >> no DVT 10/30 Echo >> mild LVH, EF 25%, mild RV systolic dysfx 00/37 CT chest >> no PE, b/l ATX with small b/l effusions  SIGNIFICANT EVENTS: 10/17 Transfer to [facility] 10/26 Extubated 10/29 Transfer out of ICU 11/01 Altered mental status, respiratory >> was off CPAP/oxygen  SUBJECTIVE:  Events of last night noted.  VITAL SIGNS: Temp:  [97.9 F (36.6 C)-99.3 F (37.4 C)] 98.5 F (36.9 C) (11/02 0745) Pulse Rate:  [67-111] 73 (11/02 0900) Resp:  [10-28] 20 (11/02 0900) BP: (65-153)/(40-108) 116/59 mmHg (11/02 0900) SpO2:  [75 %-100 %] 100 % (11/02 0900) FiO2 (%):  [40 %-50 %] 40 % (11/02 0722) VENTILATOR SETTINGS: Vent Mode:  [-] PRVC FiO2 (%):  [40 %-50 %] 40 % Set Rate:  [20 bmp] 20 bmp Vt Set:  [320 mL] 320 mL PEEP:  [5 cmH20] 5 cmH20 Plateau Pressure:  [25 CWU88-91 cmH20] 25 cmH20 INTAKE / OUTPUT:  Intake/Output Summary (Last 24 hours) at 10/08/14 0916 Last data filed at 10/08/14 0900  Gross per 24 hour  Intake 764.57 ml  Output    280 ml  Net 484.57 ml    PHYSICAL EXAMINATION: General: no distress Neuro:  RASS -1, normal strength, moves extremities HEENT:  Pupils reactive, ETT in place Cardiovascular:  Regular, no murmur Lungs:  B/l rhonchi Abdomen:  Soft, non tender Musculoskeletal:  1+ edema Skin:  No rashes  LABS:  CBC  Recent Labs Lab 10/02/14 0337 10/04/14 0342  10/08/14 0350  WBC 12.1* 6.2 8.4  HGB 9.0* 8.3* 8.4*  HCT 31.3* 28.5* 29.6*  PLT 229 235 264   BMET  Recent Labs Lab 10/04/14 0342 10/04/14 0450 10/08/14 0350  NA 137 143 144  K 6.8* 3.8 3.9  CL 101 103 103  CO2 31 32 32  BUN 6 6 8   CREATININE 0.38* 0.38* 0.41*  GLUCOSE 402* 144* 151*   Electrolytes  Recent Labs Lab 10/04/14 0342 10/04/14 0450 10/08/14 0350  CALCIUM 8.9 8.9 9.1   ABG  Recent Labs Lab 10/07/14 2329 10/08/14 0242  PHART 7.131* 7.436  PCO2ART 105.0* 54.0*  PO2ART 288.0* 154.0*   Liver Enzymes  Recent Labs Lab 10/02/14 0337 10/08/14 0350  AST 12 11  ALT 16 16  ALKPHOS 73 92  BILITOT <0.2* <0.2*  ALBUMIN 3.0* 2.7*   Glucose  Recent Labs Lab 10/06/14 1644 10/07/14 0020 10/07/14 0810 10/07/14 1822 10/08/14 0154 10/08/14 0742  GLUCAP 142* 109* 92 153* 118* 117*    Imaging Dg Chest Port 1 View  10/08/2014   CLINICAL DATA:  Endotracheal tube retracted.  EXAM: PORTABLE CHEST - 1 VIEW  COMPARISON:  10/08/2014  FINDINGS: Endotracheal tube is been pulled back and now measures 2.2 cm above the carina. Persistent shallow inspiration with atelectasis or infiltration in the lung bases and bilateral pleural effusions as before.  IMPRESSION: Endotracheal tube tip now measures 2.2 cm above the carina.  Electronically Signed   By: Lucienne Capers M.D.   On: 10/08/2014 01:33   Dg Chest Port 1 View  10/08/2014   CLINICAL DATA:  Endotracheal tube placement  EXAM: PORTABLE CHEST - 1 VIEW  COMPARISON:  10/06/2014  FINDINGS: Interval placement of endotracheal tube. The tube tip is projected over the right mainstem bronchus. Some of this the may be due to technique but withdrawal is recommended. Left PICC catheter is unchanged in position. Shallow inspiration. Atelectasis in both lung bases, increasing since previous study. Blunting of the costophrenic angles suggesting small effusions. No pneumothorax. Heart size and pulmonary vascularity are likely  normal for technique.  IMPRESSION: Endotracheal tube tip projects over the right mainstem bronchus. Increasing bilateral atelectasis and effusions. Shallow inspiration.  These results were called by telephone at the time of interpretation on 10/08/2014 at 1:18 am to ICU nurse , who verbally acknowledged these results.   Electronically Signed   By: Lucienne Capers M.D.   On: 10/08/2014 01:20      ASSESSMENT / PLAN:  PULMONARY ETT 10/16 >> 10/26  ETT 11/02 >>  A: Acute on chronic hypoxic/hypercapnic respiratory failure 2nd to HCAP, ATX, COPD, OSA, OHS, and non compliance. P:   Full vent support Family has agreed to proceed with trach F/u CXR Limit ABGs Continue scheduled BDs  CARDIOVASCULAR Lt PICC 10/20 >>  A:  Hx of HLD. P:  Continue lipitor  RENAL A:   No acute issues. P:   Monitor renal fx, urine outpt, electrolytes  GASTROINTESTINAL A:   Nutrition. P:   Tube feeds while on vent Protonix for SUP  HEMATOLOGIC A:   Anemia of critical illness. P:  F/u CBC Lovenox for DVT prevention  INFECTIOUS A:   HCAP s/p tx completed 10/19. P:   Monitor off Abx  Sputum 11/02 >> Urine 11/02 >>  ENDOCRINE A:   Hyperglycemia. P:   SSI Check HbA1c  NEUROLOGIC A:   Acute encephalopathy 2nd to respiratory failure. Hx of schizophrenia, CVA. P:   RASS goal: -1 Continue risperdal   FAMILY  - Updates: 11/02  - Inter-disciplinary family meet or Palliative Care meeting due by: 11/02    TODAY'S SUMMARY:  She has recurrent hypoxic/hypercapnic respiratory failure in setting of COPD/OSA/OHS related to non compliance with PAP/supplemental oxygen.  Explained to family these are chronic conditions that will not get better.  They are agreeable to proceed with trach >> will have assessment for perc trach.  CC time 85 minutes.  Chesley Mires, MD [facility] Pulmonary/Critical Care 10/08/2014, 9:54 AM Pager:  [phone] After 3pm call: [phone]

## 2014-10-08 NOTE — Progress Notes (Signed)
PULMONARY / CRITICAL CARE MEDICINE PROGRESS NOTE   Name: Debbie Pena MRN: [id number] DOB: 1946/07/22    ADMISSION DATE:  09/22/2014  CHIEF COMPLAINT:  Acute on chronic respiratory failure  HPI: Pt with COPD and OSA/OHS, transferred to [facility] 10/16 from [facility] after hospitalization for VDRF in setting RLL PNA. Developed progressive resp failure 10/17 despite BiPAP, resulted in resp arrest and PEA < 10 minutes. Pt intubated, stabilized and transferred to [facility] ICU for further care.  After extubation, the patient was transferred back to [facility] care on 10/29.    INTERVAL HISTORY: On 11/1, the patient was noted by nursing staff to be more lethargic and less responsive.  She was unable to partake in her meal due to somnolence. Around 11am, a rapid response was called due to hypoxia and unresponsiveness.  The patient was placed on BIPAP but was noted not to have good ventilation/chest rise, and was minimally interactive.  ABG showed hypercarbia and pH of 7.13. She was transferred to ICU and intubated.  Attempt was made to contact her daughter with phone call x3 and received busy signal.  At the time of transfer, the patient was not able to communicate or participate in history.   STUDIES:  10/13 CT chest:  RLL infiltrate, B small effusions, L renal angiolipoma 10/18 CT head: ? Chronic R BG lacunar infarct vs dilated prevascular space. No evidence CVA or global anoxia 11/1 CXR: bibasilar small effusions, atelectasis, and opacities   SIGNIFICANT EVENTS: 10/18 Minimally responsive. ?decorticate posturing. Persistent downward gaze.  10/19 Improving MS, following commands, nodding to questions.  10/20-22 Tolerates high PSV 10/22 failing versed int, started On propofol gtt 10/26 Extubated and tolerated well. Markedly confused at night - responded to haloperidol 10/27 Transfer to SDU ordered 10/28 No distress, profoundly weak. Partially oriented. Received one dose lorazepam for agitation last PM.  Transfer to med-surg ordered. TRH to assume care in AM 10/29 and PCCM to sign off 11/1 Rapid response for altered mental status, hypoxia, hypercarbia --> transfer to ICU and intubated  SUBJECTIVE:  No resp distress. No new complaints.   VITAL SIGNS: Temp:  [97.9 F (36.6 C)-99.3 F (37.4 C)] 99.3 F (37.4 C) (11/02 0200) Pulse Rate:  [67-111] 69 (11/02 0300) Resp:  [16-28] 20 (11/02 0300) BP: (65-153)/(40-108) 153/84 mmHg (11/02 0300) SpO2:  [75 %-100 %] 100 % (11/02 0300) FiO2 (%):  [40 %-50 %] 50 % (11/02 0200) HEMODYNAMICS:   VENTILATOR SETTINGS: Vent Mode:  [-] PRVC FiO2 (%):  [40 %-50 %] 50 % Set Rate:  [20 bmp] 20 bmp Vt Set:  [320 mL] 320 mL PEEP:  [5 cmH20] 5 cmH20 Plateau Pressure:  [28 cmH20] 28 cmH20 INTAKE / OUTPUT:  Intake/Output Summary (Last 24 hours) at 10/08/14 0309 Last data filed at 10/08/14 0277  Gross per 24 hour  Intake 141.27 ml  Output      0 ml  Net 141.27 ml    PHYSICAL EXAMINATION: General: Somnolent, unable to cooperate Neuro:  Opens eyes intermittently and withdraws to pain but does not follow other commands. HEENT: PERRL. Neck supple. OP with mucus  Cardiovascular:  Reg, no MRG Lungs:  Very poor air movement; quiet breath sounds, prolonged expiratory phase, no wheezes or crackles.  Abdomen:  Obese, soft, +BS Ext: warm, trace edema  LABS:  - CMP and CBC pending - Post-intubation ABG: 7.43 / 54 / 154  CXR: Bibasilar infiltrates, atelectasis.  Small pleural effusions bilaterally.   ASSESSMENT / PLAN:  PULMONARY ETT 10/17 >>  10/26, then 11/2 A:  Acute hypoxic/hypercarbic resp failure - due to aspiration and altered mental status?  Chronic resp failure Respiratory arrest 10/17 at [facility] OSA/OHS, not on regular CPAP HCAP Possible aspiration pneumonitis  P:   - Trach culture ordered - will monitor with low threshold to restart antibiotics - Continue vent management - eval for SBT in the AM - VAP bundle - Duonebs  Q4  CARDIOVASCULAR CVL L femoral CVC 10/17 >> 10/20 LUE PICC 10/23 >>  A: PEA arrest 10/17 Shock post-intubation Hyperlipidemia Sinus tachycardia  P:  - hold PRN metoprolol for hypotension - norepinephrine for MAP goal > 65 mmHg  - TTE with preserved EF - continue home atorvastatin - continue home aspirin  RENAL A:  AKI, resolved Hypokalemia -resolved P:   - CMET ordered and pending - Monitor I/Os - foley placed 11/2 - Correct electrolytes as indicated  GASTROINTESTINAL A:   Dysphagia and high risk for aspiration Chronic PPI use P:   - continue PPI - NPO for now; may start tube feeds in AM  HEMATOLOGIC A:   No acute issues P:  - DVT px: SQ enoxaparin - CBC ordered, pending - Transfuse per usual ICU guidelines  INFECTIOUS A:   S/p antibiotic course as below for RLL pneumonia Now with possible aspiration based on bibasilar infiltrates P:   - follow up sputum cx - low threshold to restart abx based on clinical status  BCx2  10/17 >> NEG C diff 1020 >> NEG Sputum cx 11/2 >>  Abx:  Levaquin 10/9 >> 10/16 Azithro 10/17 >> 10/18 Vanco, 10/17 >> 10/19 Aztreonam 10/17 >> 10/19  ENDOCRINE A:   Hyperglycemia, controlled  P:   - CBGs/SSI  NEUROLOGIC A:   Severe encephalopathy post-arrest Agitated delirium, controlled with Haloperidol Severe deconditioning Shizoaffective disorder P:   - RASS goal 0 - Sedation with fentanyl drip - likely can change to PRN dosing in AM - Continue risperidone  TODAY'S SUMMARY:  68 year old woman with hypercarbic respiratory failure and now a second intubation during this hospital course. I suspect she was slowly developing hypercarbia due to her baseline COPD, OSA, and now significant post-ICU weakness. She may also have had an aspiration event.  She is stable on vent and minimal pressors - providing supportive care.  Attempted to contact daughter but was unable to reach her.   CRITICAL CARE: The patient is critically ill  with multiple organ systems failure and requires high complexity decision making for assessment and support, frequent evaluation and titration of therapies, application of advanced monitoring technologies and extensive interpretation of multiple databases. Critical Care Time devoted to patient care services described in this note is 60 minutes.    10/08/2014, 3:09 AM

## 2014-10-08 NOTE — Progress Notes (Signed)
Wasted 73ml Propofol after intubation, witnessed by [employer].

## 2014-10-08 NOTE — Progress Notes (Signed)
PT Cancellation Note  Patient Details Name: Debbie Pena MRN: [id number] DOB: Oct 29, 1946   Cancelled Treatment:    Reason Eval/Treat Not Completed: Medical issues which prohibited therapy (noted events of day with pt now intubated. Will need new order to resume therapy. Thanks)   Lanetta Inch Beth 10/08/2014, 7:28 AM Elwyn Reach, [place]

## 2014-10-08 NOTE — Progress Notes (Signed)
Transfer note:  Notified by RN that pt has had increasing lethargy. Apparently was unable to eat dinner d/t lethargy. Notes indicate pt likely w/ increasing lethargy through-out the day. RR RN was paged and is currently at bedside. ABG was obtained and revealed pH-7.13, pC02- 105, p02-288 w/ bicarb of 33.8. Pt was placed on BiPAP but does not appear to be adequately ventilating. At bedside pt will slightly open eyes to noxious stimuli but does not respond verbally. Minimal air movement bilaterally despite BiPAP.  Discussed pt w/ Dr Titus Mould w/ [facility] who has recommended transfer to ICU immediately. Assisted w/ transfer of pt in bed to room 2S-05. Dr Orma Render present in ICU at the time of pt arrival and pt was immediately prepared for intubation. Daughter was updated by phone (by RN and also by Eino Farber, RR RN).  Assessment/Plan: 1. Acute hypercarbic respiratory failure: Refractory to BiPAP. Secondary to COPD exacerbation, OSA/OHS. Intubated on 10/17 following PA arrest, extubated on 10/26. CCM to manage while intubated. Appreciate CCM input and management.  Jeryl Columbia, NP-C [facility] Pager [phone]

## 2014-10-08 NOTE — Clinical Social Work Note (Signed)
CSW spoke with Nira Conn in admissions at [facility] in [place] [phone]).  Nira Conn stated that patient is ok to return to [facility] when patient is able and would appreciate updates on patient condition.  CSW informed unit CSW.  Domenica Reamer, [place] Social Worker [phone]

## 2014-10-08 NOTE — Procedures (Signed)
Intubation Procedure Note Debbie Pena [id number] [birth year]  Procedure: Intubation Indications: Respiratory insufficiency  Procedure Details Consent: Unable to obtain consent because of emergent medical necessity. Time Out: Verified patient identification, verified procedure, site/side was marked, verified correct patient position, special equipment/implants available, medications/allergies/relevent history reviewed, required imaging and test results available.  Performed  Medications used: fentanyl and midazolam and propofol.  NO paralytic.  Equipment used: Initially attempted x3 with glidescope.  Airway very anterior and difficult to intubate.  Then attempted with  Macintosh 3 laryngoscope blade x1, with successful intubation.  Grade III View Correct placement confirmed by by auscultation, by CXR and ETCO2 monitor Tube secured at 22 cm at the lip  Evaluation Hemodynamic Status: Transient hypotension treated with pressors and treated with fluid; O2 sats: stable throughout Patient's Current Condition: stable Complications: No apparent complications Patient did tolerate procedure well. Chest X-ray ordered to verify placement.  CXR: tube position low-repostitioned.   Timmothy Sours, M.D. Pulmonary and Critical Care Medicine Pager [phone] Call [place] with questions [phone] 10/08/2014

## 2014-10-08 NOTE — Progress Notes (Signed)
Nurse called to room, found pt with c-pap off. Pt  O2 Sat were 48% when checked. Pt placed on non-rebreather, O2 Sat came to 100% on non-rebreather. Rapid response at contacted. MD notified pt's increase O2 demand and increased lethargy. Lungs diminshed in all lung fields. Arterial blood gases obtained.

## 2014-10-09 ENCOUNTER — Inpatient Hospital Stay (HOSPITAL_COMMUNITY): Payer: Medicare Other

## 2014-10-09 DIAGNOSIS — J410 Simple chronic bronchitis: Secondary | ICD-10-CM

## 2014-10-09 LAB — GLUCOSE, CAPILLARY
GLUCOSE-CAPILLARY: 136 mg/dL — AB (ref 70–99)
Glucose-Capillary: 118 mg/dL — ABNORMAL HIGH (ref 70–99)
Glucose-Capillary: 126 mg/dL — ABNORMAL HIGH (ref 70–99)
Glucose-Capillary: 137 mg/dL — ABNORMAL HIGH (ref 70–99)
Glucose-Capillary: 146 mg/dL — ABNORMAL HIGH (ref 70–99)
Glucose-Capillary: 151 mg/dL — ABNORMAL HIGH (ref 70–99)

## 2014-10-09 LAB — BASIC METABOLIC PANEL
Anion gap: 7 (ref 5–15)
Anion gap: 6 (ref 5–15)
BUN: 14 mg/dL (ref 6–23)
BUN: 15 mg/dL (ref 6–23)
CALCIUM: 8.9 mg/dL (ref 8.4–10.5)
CHLORIDE: 105 meq/L (ref 96–112)
CHLORIDE: 105 meq/L (ref 96–112)
CO2: 34 meq/L — AB (ref 19–32)
CO2: 37 meq/L — AB (ref 19–32)
CREATININE: 0.44 mg/dL — AB (ref 0.50–1.10)
CREATININE: 0.45 mg/dL — AB (ref 0.50–1.10)
Calcium: 9.2 mg/dL (ref 8.4–10.5)
GFR calc Af Amer: >90 mL/min (ref >90)
GFR calc Af Amer: >90 mL/min (ref >90)
GFR calc non Af Amer: >90 mL/min (ref >90)
GFR calc non Af Amer: >90 mL/min (ref >90)
Glucose, Bld: 131 mg/dL — ABNORMAL HIGH (ref 70–99)
Glucose, Bld: 130 mg/dL — ABNORMAL HIGH (ref 70–99)
Potassium: 2.8 mEq/L — CL (ref 3.7–5.3)
Potassium: 3.8 mEq/L (ref 3.7–5.3)
Sodium: 146 mEq/L (ref 137–147)
Sodium: 148 mEq/L — ABNORMAL HIGH (ref 137–147)

## 2014-10-09 LAB — CBC
HEMATOCRIT: 24.6 % — AB (ref 36.0–46.0)
HEMATOCRIT: 25.4 % — AB (ref 36.0–46.0)
HEMOGLOBIN: 7.1 g/dL — AB (ref 12.0–15.0)
Hemoglobin: 7.4 g/dL — ABNORMAL LOW (ref 12.0–15.0)
MCH: 24.1 pg — ABNORMAL LOW (ref 26.0–34.0)
MCH: 23.9 pg — AB (ref 26.0–34.0)
MCHC: 28.9 g/dL — AB (ref 30.0–36.0)
MCHC: 29.1 g/dL — AB (ref 30.0–36.0)
MCV: 83.4 fL (ref 78.0–100.0)
MCV: 82.2 fL (ref 78.0–100.0)
Platelets: 215 10*3/uL (ref 150–400)
Platelets: 262 10*3/uL (ref 150–400)
RBC: 2.95 MIL/uL — ABNORMAL LOW (ref 3.87–5.11)
RBC: 3.09 MIL/uL — ABNORMAL LOW (ref 3.87–5.11)
RDW: 15.3 % (ref 11.5–15.5)
RDW: 15.7 % — ABNORMAL HIGH (ref 11.5–15.5)
WBC: 4.5 10*3/uL (ref 4.0–10.5)
WBC: 5.7 10*3/uL (ref 4.0–10.5)

## 2014-10-09 LAB — MAGNESIUM: Magnesium: 1.6 mg/dL (ref 1.5–2.5)

## 2014-10-09 MED ORDER — VECURONIUM BROMIDE 10 MG IV SOLR
10.0000 mg | Freq: Once | INTRAVENOUS | Status: AC
  Administered 2014-10-10: 8 mg via INTRAVENOUS
  Filled 2014-10-09: qty 10

## 2014-10-09 MED ORDER — POTASSIUM CHLORIDE 20 MEQ/15ML (10%) PO SOLN
40.0000 meq | ORAL | Status: AC
  Administered 2014-10-09 (×2): 40 meq
  Filled 2014-10-09 (×2): qty 30

## 2014-10-09 MED ORDER — FUROSEMIDE 10 MG/ML IJ SOLN
40.0000 mg | Freq: Once | INTRAMUSCULAR | Status: AC
  Administered 2014-10-09: 40 mg via INTRAVENOUS
  Filled 2014-10-09: qty 4

## 2014-10-09 MED ORDER — FENTANYL CITRATE 0.05 MG/ML IJ SOLN
200.0000 ug | Freq: Once | INTRAMUSCULAR | Status: AC
  Administered 2014-10-10: 200 ug via INTRAVENOUS

## 2014-10-09 MED ORDER — PROPOFOL 10 MG/ML IV EMUL: 5.0000 ug/kg/min | Freq: Once | INTRAVENOUS | Status: DC

## 2014-10-09 MED ORDER — VANCOMYCIN HCL IN DEXTROSE 1-5 GM/200ML-% IV SOLN
1000.0000 mg | Freq: Two times a day (BID) | INTRAVENOUS | Status: DC
  Administered 2014-10-09 – 2014-10-11 (×4): 1000 mg via INTRAVENOUS
  Filled 2014-10-09 (×5): qty 200

## 2014-10-09 MED ORDER — ETOMIDATE 2 MG/ML IV SOLN
40.0000 mg | Freq: Once | INTRAVENOUS | Status: AC
  Administered 2014-10-10: 40 mg via INTRAVENOUS
  Filled 2014-10-09: qty 20

## 2014-10-09 MED ORDER — MIDAZOLAM HCL 2 MG/2ML IJ SOLN
4.0000 mg | Freq: Once | INTRAMUSCULAR | Status: AC
  Administered 2014-10-10: 4 mg via INTRAVENOUS
  Filled 2014-10-09: qty 4

## 2014-10-09 NOTE — Progress Notes (Signed)
ANTIBIOTIC CONSULT NOTE - INITIAL  Pharmacy Consult for vancomycin Indication: enterococcal UTI  Allergies  Allergen Reactions  . Klonopin [Clonazepam]   . Penicillins Other (See Comments)    Patient Measurements: Height: 5' (152.4 cm) Weight: 176 lb 2.4 oz (79.9 kg) IBW/kg (Calculated) : 45.5   Vital Signs: Temp: 99.1 F (37.3 C) (11/03 1530) Temp Source: Axillary (11/03 1530) BP: 103/85 mmHg (11/03 1700) Pulse Rate: 85 (11/03 1700) Intake/Output from previous day: 11/02 0701 - 11/03 0700 In: 1307.2 [I.V.:721.8; NG/GT:585.3] Out: 810 [Urine:810] Intake/Output from this shift: Total I/O In: 807 [I.V.:197; NG/GT:610] Out: 1455 [Urine:1455]  Labs:  Recent Labs  10/08/14 0350 10/09/14 0400 10/09/14 1600  WBC 8.4 4.5 5.7  HGB 8.4* 7.1* 7.4*  PLT 264 215 262  CREATININE 0.41* 0.44* 0.45*   Estimated Creatinine Clearance: 62.1 mL/min (by C-G formula based on Cr of 0.45). No results for input(s): VANCOTROUGH, VANCOPEAK, VANCORANDOM, GENTTROUGH, GENTPEAK, GENTRANDOM, TOBRATROUGH, TOBRAPEAK, TOBRARND, AMIKACINPEAK, AMIKACINTROU, AMIKACIN in the last 72 hours.   Microbiology: Recent Results (from the past 720 hour(s))  Culture, blood (routine x 2)     Status: None   Collection Time: 09/22/14 10:47 PM  Result Value Ref Range Status   Specimen Description BLOOD LEFT ANTECUBITAL  Final   Special Requests BOTTLES DRAWN AEROBIC AND ANAEROBIC 10CC EA  Final   Culture  Setup Time   Final    09/23/2014 15:20 Performed at [place]   Final    NO GROWTH 5 DAYS Performed at [facility]   Report Status 09/29/2014 FINAL  Final  Culture, blood (routine x 2)     Status: None   Collection Time: 09/22/14 10:57 PM  Result Value Ref Range Status   Specimen Description BLOOD LEFT ANTECUBITAL  Final   Special Requests BOTTLES DRAWN AEROBIC AND ANAEROBIC 10CC EA  Final   Culture  Setup Time   Final    09/23/2014 15:21 Performed at [facility]    Culture   Final    NO GROWTH 5 DAYS Performed at [facility]   Report Status 09/29/2014 FINAL  Final  MRSA PCR Screening     Status: None   Collection Time: 09/23/14  8:48 PM  Result Value Ref Range Status   MRSA by PCR NEGATIVE NEGATIVE Final    Comment:        The GeneXpert MRSA Assay (FDA approved for NASAL specimens only), is one component of a comprehensive MRSA colonization surveillance program. It is not intended to diagnose MRSA infection nor to guide or monitor treatment for MRSA infections.  Clostridium Difficile by PCR     Status: None   Collection Time: 09/25/14  2:37 PM  Result Value Ref Range Status   C difficile by pcr NEGATIVE NEGATIVE Final  Culture, Urine     Status: None (Preliminary result)   Collection Time: 10/08/14  1:43 AM  Result Value Ref Range Status   Specimen Description URINE, CATHETERIZED  Final   Special Requests NONE  Final   Culture  Setup Time   Final    10/08/2014 09:41 Performed at [place]   Final    >=100,000 COLONIES/ML Performed at [facility]    Culture   Final    ENTEROCOCCUS SPECIES Performed at [facility]    Report Status PENDING  Incomplete  Culture, respiratory (NON-Expectorated)     Status: None (Preliminary result)   Collection Time: 10/08/14  4:10 AM  Result Value Ref Range Status   Specimen Description TRACHEAL ASPIRATE  Final   Special Requests NONE  Final   Gram Stain   Final    RARE WBC PRESENT,BOTH PMN AND MONONUCLEAR RARE SQUAMOUS EPITHELIAL CELLS PRESENT FEW GRAM POSITIVE COCCI IN PAIRS RARE YEAST RARE GRAM POSITIVE RODS    Culture   Final    Non-Pathogenic Oropharyngeal-type Flora Isolated. Performed at [facility]    Report Status PENDING  Incomplete   Assessment: Debbie Pena who was admitted on 10/17 s/p respiratory failure and PEA. Now with enterococcal UTI to start vancomycin d/t penicillin allergy. SCr 0.45 with est CrCl  ~36mL/min.  Note patient was previously treated with vancomycin for PNA during this admission but we do not have a trough from that time.  Goal of Therapy:  Vancomycin trough level 15-20 mcg/ml  Plan:  1. Vancomycin 1000mg  IV q12h 2. Follow c/s, clinical progression, renal function, trough at [place] D. Sonji Starkes, PharmD, BCPS Clinical Pharmacist Pager: [phone] 10/09/2014 6:12 PM

## 2014-10-09 NOTE — Progress Notes (Signed)
PULMONARY / CRITICAL CARE MEDICINE   Name: DYMIN DINGLEDINE MRN: [id number] DOB: [birth year]    ADMISSION DATE:  09/22/2014  CHIEF COMPLAINT:  Short of breath  INITIAL PRESENTATION:  68 yo female former smoker transferred from [facility] with VDRF and RLL PNA complicated by PEA with hx of COPD, OSA/OHS.  Developed respiratory arrest 11/01 and transfer back to ICU.  STUDIES:  10/13 CT chest >> RLL infiltrate, B small effusions, L renal angiolipoma 10/18 CT head >> Chronic R BG lacunar infarct vs dilated prevascular space. No evidence CVA or global anoxia 10/29 Doppler legs >> no DVT 10/30 Echo >> mild LVH, EF 88%, mild RV systolic dysfx 41/66 CT chest >> no PE, b/l ATX with small b/l effusions  SIGNIFICANT EVENTS: 10/17 Transfer to [facility] 10/26 Extubated 10/29 Transfer out of ICU 11/01 Altered mental status, respiratory >> was off CPAP/oxygen 11/02 Off pressors  SUBJECTIVE:  Off pressors.  VITAL SIGNS: Temp:  [97.6 F (36.4 C)-100.3 F (37.9 C)] 97.6 F (36.4 C) (11/03 0700) Pulse Rate:  [65-105] 105 (11/03 0844) Resp:  [16-31] 31 (11/03 0844) BP: (92-153)/(38-115) 153/49 mmHg (11/03 0844) SpO2:  [100 %] 100 % (11/03 0843) FiO2 (%):  [40 %] 40 % (11/03 0844) Weight:  [175 lb 4.3 oz (79.5 kg)-176 lb 2.4 oz (79.9 kg)] 176 lb 2.4 oz (79.9 kg) (11/03 0500) VENTILATOR SETTINGS: Vent Mode:  [-] PRVC FiO2 (%):  [40 %] 40 % Set Rate:  [16 bmp] 16 bmp Vt Set:  [320 mL] 320 mL PEEP:  [5 cmH20] 5 cmH20 Plateau Pressure:  [19 cmH20-22 cmH20] 19 cmH20 INTAKE / OUTPUT:  Intake/Output Summary (Last 24 hours) at 10/09/14 0855 Last data filed at 10/09/14 0700  Gross per 24 hour  Intake 1227.16 ml  Output    735 ml  Net 492.16 ml    PHYSICAL EXAMINATION: General: no distress Neuro:  RASS -1, normal strength, moves extremities HEENT:  Pupils reactive, ETT in place Cardiovascular:  Regular, no murmur Lungs:  B/l rhonchi Abdomen:  Soft, non tender Musculoskeletal:  1+ edema Skin:   No rashes  LABS:  CBC  Recent Labs Lab 10/04/14 0342 10/08/14 0350 10/09/14 0400  WBC 6.2 8.4 4.5  HGB 8.3* 8.4* 7.1*  HCT 28.5* 29.6* 24.6*  PLT 235 264 215   BMET  Recent Labs Lab 10/04/14 0450 10/08/14 0350 10/09/14 0400  NA 143 144 146  K 3.8 3.9 2.8*  CL 103 103 105  CO2 32 32 34*  BUN 6 8 14   CREATININE 0.38* 0.41* 0.44*  GLUCOSE 144* 151* 131*   Electrolytes  Recent Labs Lab 10/04/14 0450 10/08/14 0350 10/09/14 0400  CALCIUM 8.9 9.1 8.9   ABG  Recent Labs Lab 10/07/14 2329 10/08/14 0242  PHART 7.131* 7.436  PCO2ART 105.0* 54.0*  PO2ART 288.0* 154.0*   Liver Enzymes  Recent Labs Lab 10/08/14 0350  AST 11  ALT 16  ALKPHOS 92  BILITOT <0.2*  ALBUMIN 2.7*   Glucose  Recent Labs Lab 10/08/14 1201 10/08/14 1554 10/08/14 1950 10/08/14 2332 10/09/14 0355 10/09/14 0825  GLUCAP 116* 94 102* 107* 126* 118*    Imaging Dg Chest Port 1 View  10/09/2014   CLINICAL DATA:  Respiratory failure  EXAM: PORTABLE CHEST - 1 VIEW  COMPARISON:  10/08/2014  FINDINGS: Cardiac shadow is enlarged but stable. Bilateral pleural effusions are again noted. The endotracheal tube lies 3 cm above the carina. A left-sided PICC line is noted in satisfactory position within the proximal superior vena  cava. A nasogastric catheter is noted within the stomach.  IMPRESSION: Bibasilar changes.  Tubes and lines as described.   Electronically Signed   By: Inez Catalina M.D.   On: 10/09/2014 07:47   Dg Chest Port 1 View  10/08/2014   CLINICAL DATA:  Endotracheal tube retracted.  EXAM: PORTABLE CHEST - 1 VIEW  COMPARISON:  10/08/2014  FINDINGS: Endotracheal tube is been pulled back and now measures 2.2 cm above the carina. Persistent shallow inspiration with atelectasis or infiltration in the lung bases and bilateral pleural effusions as before.  IMPRESSION: Endotracheal tube tip now measures 2.2 cm above the carina.   Electronically Signed   By: Lucienne Capers M.D.   On:  10/08/2014 01:33   Dg Chest Port 1 View  10/08/2014   CLINICAL DATA:  Endotracheal tube placement  EXAM: PORTABLE CHEST - 1 VIEW  COMPARISON:  10/06/2014  FINDINGS: Interval placement of endotracheal tube. The tube tip is projected over the right mainstem bronchus. Some of this the may be due to technique but withdrawal is recommended. Left PICC catheter is unchanged in position. Shallow inspiration. Atelectasis in both lung bases, increasing since previous study. Blunting of the costophrenic angles suggesting small effusions. No pneumothorax. Heart size and pulmonary vascularity are likely normal for technique.  IMPRESSION: Endotracheal tube tip projects over the right mainstem bronchus. Increasing bilateral atelectasis and effusions. Shallow inspiration.  These results were called by telephone at the time of interpretation on 10/08/2014 at 1:18 am to ICU nurse , who verbally acknowledged these results.   Electronically Signed   By: Lucienne Capers M.D.   On: 10/08/2014 01:20      ASSESSMENT / PLAN:  PULMONARY ETT 10/16 >> 10/26  ETT 11/02 >>  A: Acute on chronic hypoxic/hypercapnic respiratory failure 2nd to HCAP, ATX, COPD, OSA, OHS, and non compliance. P:   Full vent support Family has agreed to proceed with trach >> tentative plan for 11/04 F/u CXR Continue scheduled BDs  CARDIOVASCULAR Lt PICC 10/20 >>  A:  Hx of HLD. P:  Continue lipitor  RENAL A:   Hypervolemia. P:   Monitor renal fx, urine outpt, electrolytes Lasix 40 mg IV x one on 11/03  GASTROINTESTINAL A:   Nutrition. P:   Tube feeds while on vent Protonix for SUP  HEMATOLOGIC A:   Anemia of critical illness >> no evidence for bleeding. P:  F/u CBC Lovenox for DVT prevention  INFECTIOUS A:   HCAP s/p tx completed 10/19. P:   Monitor off Abx  Sputum 11/02 >> Urine 11/02 >>  ENDOCRINE A:   Hyperglycemia >> HbA1c 6.5 from 11/02. P:   SSI  NEUROLOGIC A:   Acute encephalopathy 2nd to  respiratory failure. Hx of schizophrenia, CVA. P:   RASS goal: -1 Continue risperdal   FAMILY  - Updates: 11/02  - Inter-disciplinary family meet or Palliative Care meeting due by: 11/02    TODAY'S SUMMARY:  Will arrange for perc trach - tentative plan for 11/04.  CC time 35 minutes.  Chesley Mires, MD [facility] Pulmonary/Critical Care 10/09/2014, 8:55 AM Pager:  [phone] After 3pm call: [phone]

## 2014-10-09 NOTE — Progress Notes (Signed)
[place] Progress Note Patient Name: Debbie Pena DOB: 1946/03/09 MRN: [id number]   Date of Service  10/09/2014  HPI/Events of Note  Enterococcal UTI, allergic to PCN  eICU Interventions  Start vanc     Intervention Category Intermediate Interventions: Diagnostic test evaluation  [name] V. 10/09/2014, 5:38 PM

## 2014-10-09 NOTE — Clinical Social Work Note (Signed)
Attempted to see patient to discuss how she is progressing and discussion about SNF placement.  Patient was sleeping will attempt to see tomorrow.  Jones Broom. [place], MSW, [place] 10/09/2014 2:10 PM

## 2014-10-09 NOTE — Progress Notes (Signed)
[place] Progress Note Patient Name: Debbie Pena DOB: May 05, 1946 MRN: [id number]   Date of Service  10/09/2014  HPI/Events of Note  Best Practice  eICU Interventions  SCDs ordered for DVT propy     Intervention Category Intermediate Interventions: Best-practice therapies (e.g. DVT, beta blocker, etc.)  [name] 10/09/2014, 12:31 AM

## 2014-10-09 NOTE — Progress Notes (Signed)
Spoke with daughter Donaciano Eva, obtained consent over the phone.  Plan to trach tomorrow 11/4 at 1 PM.  Rush Farmer, M.D. [facility] Pulmonary/Critical Care Medicine. Pager: [phone]. After hours pager: [phone].

## 2014-10-09 NOTE — Progress Notes (Signed)
[place] Progress Note Patient Name: Debbie Pena DOB: 15-Dec-1945 MRN: [id number]   Date of Service  10/09/2014  HPI/Events of Note  Hypokalemia  eICU Interventions  Potassium replaced     Intervention Category Intermediate Interventions: Electrolyte abnormality - evaluation and management  Julis Haubner 10/09/2014, 5:13 AM

## 2014-10-10 ENCOUNTER — Inpatient Hospital Stay (HOSPITAL_COMMUNITY): Payer: Medicare Other

## 2014-10-10 DIAGNOSIS — J9600 Acute respiratory failure, unspecified whether with hypoxia or hypercapnia: Secondary | ICD-10-CM

## 2014-10-10 LAB — BASIC METABOLIC PANEL
Anion gap: 7 (ref 5–15)
BUN: 20 mg/dL (ref 6–23)
CALCIUM: 9.3 mg/dL (ref 8.4–10.5)
CO2: 36 mEq/L — ABNORMAL HIGH (ref 19–32)
CREATININE: 0.44 mg/dL — AB (ref 0.50–1.10)
Chloride: 108 mEq/L (ref 96–112)
GFR calc Af Amer: >90 mL/min (ref >90)
GFR calc non Af Amer: >90 mL/min (ref >90)
GLUCOSE: 100 mg/dL — AB (ref 70–99)
Potassium: 3.5 mEq/L — ABNORMAL LOW (ref 3.7–5.3)
Sodium: 151 mEq/L — ABNORMAL HIGH (ref 137–147)

## 2014-10-10 LAB — CBC
HEMATOCRIT: 25.9 % — AB (ref 36.0–46.0)
Hemoglobin: 7.6 g/dL — ABNORMAL LOW (ref 12.0–15.0)
MCH: 24.2 pg — AB (ref 26.0–34.0)
MCHC: 29.3 g/dL — AB (ref 30.0–36.0)
MCV: 82.5 fL (ref 78.0–100.0)
Platelets: 229 10*3/uL (ref 150–400)
RBC: 3.14 MIL/uL — ABNORMAL LOW (ref 3.87–5.11)
RDW: 15.8 % — AB (ref 11.5–15.5)
WBC: 4.6 10*3/uL (ref 4.0–10.5)

## 2014-10-10 LAB — CULTURE, RESPIRATORY W GRAM STAIN

## 2014-10-10 LAB — GLUCOSE, CAPILLARY
Glucose-Capillary: 109 mg/dL — ABNORMAL HIGH (ref 70–99)
Glucose-Capillary: 97 mg/dL (ref 70–99)
Glucose-Capillary: 84 mg/dL (ref 70–99)
Glucose-Capillary: 119 mg/dL — ABNORMAL HIGH (ref 70–99)

## 2014-10-10 LAB — CULTURE, RESPIRATORY

## 2014-10-10 LAB — PROTIME-INR
INR: 1.1 (ref 0.00–1.49)
Prothrombin Time: 14.4 seconds (ref 11.6–15.2)

## 2014-10-10 MED ORDER — LORAZEPAM 2 MG/ML IJ SOLN: 1.0000 mg | INTRAMUSCULAR | Status: DC | PRN

## 2014-10-10 MED ORDER — POTASSIUM CHLORIDE 20 MEQ/15ML (10%) PO SOLN
40.0000 meq | Freq: Once | ORAL | Status: AC
  Administered 2014-10-10: 40 meq
  Filled 2014-10-10: qty 30

## 2014-10-10 MED ORDER — MIDAZOLAM HCL 2 MG/2ML IJ SOLN
INTRAMUSCULAR | Status: AC
  Administered 2014-10-10: 2 mg
  Filled 2014-10-10: qty 2

## 2014-10-10 MED ORDER — POTASSIUM CHLORIDE 20 MEQ/15ML (10%) PO SOLN
40.0000 meq | Freq: Once | ORAL | Status: AC
  Administered 2014-10-10: 40 meq via ORAL
  Filled 2014-10-10: qty 30

## 2014-10-10 NOTE — Progress Notes (Signed)
[place] Progress Note Patient Name: Debbie Pena DOB: 23-Jun-1946 MRN: [id number]   Date of Service  10/10/2014  HPI/Events of Note    eICU Interventions  K+ repleted     Intervention Category Intermediate Interventions: Electrolyte abnormality - evaluation and management  Merton Border 10/10/2014, 6:25 AM

## 2014-10-10 NOTE — Progress Notes (Signed)
Richardson Landry Minor aware of KUB and CXR results. Will resume TF per order. Debbie Pena

## 2014-10-10 NOTE — Procedures (Signed)
Tracheostomy Placement  Consent from daughters, patient sedated, paralyzed and positioned, area cleaned, lidocaine/epi injected, skin incision done followed by blunt dissection.  Airway entered and visualized bronchoscopically, airway crushed and dilated, size 6 cuffed shiley placed without difficulty and visualized bronchoscopically well above carina.  CXR ordered and pending.  Patient tolerated the procedure well without complications.  Rush Farmer, M.D. [facility] Pulmonary/Critical Care Medicine. Pager: [phone]. After hours pager: [phone].

## 2014-10-10 NOTE — Procedures (Signed)
Intubation Procedure Note RANDAL YEPIZ [id number] 02/14/1946  Procedure: Intubation Indications: Respiratory insufficiency  Procedure Details Consent: Risks of procedure as well as the alternatives and risks of each were explained to the (patient/caregiver).  Consent for procedure obtained. Time Out: Verified patient identification, verified procedure, site/side was marked, verified correct patient position, special equipment/implants available, medications/allergies/relevent history reviewed, required imaging and test results available.  Performed  Maximum sterile technique was used including gloves, hand hygiene and mask.  MAC    Evaluation Hemodynamic Status: BP stable throughout; O2 sats: stable throughout Patient's Current Condition: stable Complications: No apparent complications Patient did tolerate procedure well. Chest X-ray ordered to verify placement.  CXR: tube position acceptable.   YACOUB,WESAM 10/10/2014

## 2014-10-10 NOTE — Procedures (Signed)
Bronchoscopy  for Percutaneous  Tracheostomy  Name: MADELEIN MAHADEO MRN: [id number] DOB: [birth year] Procedure: Bronchoscopy for Percutaneous Tracheostomy Indications: Diagnostic evaluation of the airways In conjunction with: Dr. Nelda Marseille   Procedure Details Consent: Risks of procedure as well as the alternatives and risks of each were explained to the (patient/caregiver).  Consent for procedure obtained. Time Out: Verified patient identification, verified procedure, site/side was marked, verified correct patient position, special equipment/implants available, medications/allergies/relevent history reviewed, required imaging and test results available.  Performed  In preparation for procedure, patient was given 100% FiO2 and bronchoscope lubricated. Sedation: Benzodiazepines, Muscle relaxants and Etomidate  Airway entered and the following bronchi were examined: RML.   Procedures performed: Endotracheal Tube retracted in 2 cm increments. Cannulation of airway observed. Dilation observed. Placement of trachel tube  observed . No overt complications. Bronchoscope removed.  , Patient placed back on 100% FiO2 at conclusion of procedure.    Evaluation Hemodynamic Status: BP stable throughout; O2 sats: stable throughout Patient's Current Condition: stable Specimens:  None Complications: No apparent complications Patient did tolerate procedure well.   Richardson Landry Minor ACNP [facility] PCCM Pager [phone] till 3 pm If no answer page [phone] 10/10/2014, 1:23 PM   perfromed with and supervised in full No post wall injury noted Well placed trach  Lavon Paganini. Titus Mould, MD, [place] Pgr: [place] Pulmonary & Critical Care

## 2014-10-10 NOTE — Progress Notes (Signed)
PULMONARY / CRITICAL CARE MEDICINE   Name: Debbie Pena MRN: [id number] DOB: November 09, 1946    ADMISSION DATE:  09/22/2014  CHIEF COMPLAINT:  Short of breath  INITIAL PRESENTATION:  68 yo female former smoker transferred from [facility] with VDRF and RLL PNA complicated by PEA with hx of COPD, OSA/OHS.  Developed respiratory arrest 11/01 and transfer back to ICU.  STUDIES:  10/13 CT chest >> RLL infiltrate, B small effusions, L renal angiolipoma 10/18 CT head >> Chronic R BG lacunar infarct vs dilated prevascular space. No evidence CVA or global anoxia 10/29 Doppler legs >> no DVT 10/30 Echo >> mild LVH, EF 51%, mild RV systolic dysfx 70/01 CT chest >> no PE, b/l ATX with small b/l effusions  SIGNIFICANT EVENTS: 10/17 Transfer to [facility] 10/26 Extubated 10/29 Transfer out of ICU 11/01 Altered mental status, respiratory >> was off CPAP/oxygen 11/02 Off pressors  SUBJECTIVE:  Anxious.  VITAL SIGNS: Temp:  [98.2 F (36.8 C)-99.2 F (37.3 C)] 98.5 F (36.9 C) (11/04 0823) Pulse Rate:  [75-137] 79 (11/04 0700) Resp:  [16-36] 19 (11/04 0700) BP: (94-154)/(39-85) 142/65 mmHg (11/04 0700) SpO2:  [100 %] 100 % (11/04 0826) FiO2 (%):  [40 %] 40 % (11/04 0826) Weight:  [167 lb 1.6 oz (75.796 kg)-176 lb 2.4 oz (79.9 kg)] 167 lb 1.6 oz (75.796 kg) (11/04 0459) VENTILATOR SETTINGS: Vent Mode:  [-] PRVC FiO2 (%):  [40 %] 40 % Set Rate:  [16 bmp] 16 bmp Vt Set:  [320 mL] 320 mL PEEP:  [5 cmH20] 5 cmH20 Plateau Pressure:  [19 cmH20-23 cmH20] 23 cmH20 INTAKE / OUTPUT:  Intake/Output Summary (Last 24 hours) at 10/10/14 0828 Last data filed at 10/10/14 0800  Gross per 24 hour  Intake   1810 ml  Output   1860 ml  Net    -50 ml    PHYSICAL EXAMINATION: General: no distress Neuro:  RASS 0, normal strength, moves extremities HEENT:  Pupils reactive, ETT in place Cardiovascular:  Regular, no murmur Lungs:  B/l rhonchi Abdomen:  Soft, non tender Musculoskeletal:  no edema Skin:  No  rashes  LABS:  CBC  Recent Labs Lab 10/09/14 0400 10/09/14 1600 10/10/14 0420  WBC 4.5 5.7 4.6  HGB 7.1* 7.4* 7.6*  HCT 24.6* 25.4* 25.9*  PLT 215 262 229   BMET  Recent Labs Lab 10/09/14 0400 10/09/14 1600 10/10/14 0420  NA 146 148* 151*  K 2.8* 3.8 3.5*  CL 105 105 108  CO2 34* 37* 36*  BUN 14 15 20   CREATININE 0.44* 0.45* 0.44*  GLUCOSE 131* 130* 100*   Electrolytes  Recent Labs Lab 10/09/14 0400 10/09/14 1600 10/10/14 0420  CALCIUM 8.9 9.2 9.3  MG  --  1.6  --    ABG  Recent Labs Lab 10/07/14 2329 10/08/14 0242  PHART 7.131* 7.436  PCO2ART 105.0* 54.0*  PO2ART 288.0* 154.0*   Liver Enzymes  Recent Labs Lab 10/08/14 0350  AST 11  ALT 16  ALKPHOS 92  BILITOT <0.2*  ALBUMIN 2.7*   Glucose  Recent Labs Lab 10/09/14 0355 10/09/14 0825 10/09/14 1147 10/09/14 1557 10/09/14 1944 10/09/14 2331  GLUCAP 126* 118* 146* 151* 136* 137*    Imaging Dg Chest Port 1 View  10/09/2014   CLINICAL DATA:  Respiratory failure  EXAM: PORTABLE CHEST - 1 VIEW  COMPARISON:  10/08/2014  FINDINGS: Cardiac shadow is enlarged but stable. Bilateral pleural effusions are again noted. The endotracheal tube lies 3 cm above the carina. A left-sided  PICC line is noted in satisfactory position within the proximal superior vena cava. A nasogastric catheter is noted within the stomach.  IMPRESSION: Bibasilar changes.  Tubes and lines as described.   Electronically Signed   By: Inez Catalina M.D.   On: 10/09/2014 07:47      ASSESSMENT / PLAN:  PULMONARY ETT 10/16 >> 10/26  ETT 11/02 >>  A: Acute on chronic hypoxic/hypercapnic respiratory failure 2nd to HCAP, ATX, COPD, OSA, OHS, and non compliance. P:   For trach 11/04 > push vent weaning after F/u CXR after trach Continue scheduled BDs  CARDIOVASCULAR Lt PICC 10/20 >>  A:  Hx of HLD. P:  Continue lipitor  RENAL A:   Hypervolemia >> resolved. Hypernatremia. P:   Monitor renal fx, urine outpt,  electrolytes Hold further diuresis  GASTROINTESTINAL A:   Nutrition. P:   Tube feeds while on vent Protonix for SUP  HEMATOLOGIC A:   Anemia of critical illness >> no evidence for bleeding. P:  F/u CBC Lovenox for DVT prevention  INFECTIOUS A:   HCAP s/p tx completed 10/19. Enterococcus in urine >> no evidence for active infection. P:   Vancomycin started 11/03  Urine 11/02 > enterococcus  ENDOCRINE A:   Hyperglycemia >> HbA1c 6.5 from 11/02. P:   SSI  NEUROLOGIC A:   Acute encephalopathy 2nd to respiratory failure. Hx of schizophrenia, CVA. P:   RASS goal: -1 Continue risperdal   FAMILY  - Updates: 11/02  - Inter-disciplinary family meet or Palliative Care meeting due by: 11/02    TODAY'S SUMMARY:  Perc trach scheduled for 11/04.  CC time 35 minutes.  Chesley Mires, MD [facility] Pulmonary/Critical Care 10/10/2014, 8:28 AM Pager:  [phone] After 3pm call: [phone]

## 2014-10-10 NOTE — Procedures (Signed)
Bedside Tracheostomy Insertion Procedure Note   Patient Details:   Name: Debbie Pena DOB: 12-May-1946 MRN: [id number]  Procedure: Tracheostomy  Pre Procedure Assessment: ET Tube Size:8.0  ET Tube secured at lip (cm):24 Bite block in place: No Breath Sounds: Rhonch  Post Procedure Assessment: BP 122/60 mmHg  Pulse 68  Temp(Src) 98.7 F (37.1 C) (Oral)  Resp 17  Ht 5' (1.524 m)  Wt 167 lb 1.6 oz (75.796 kg)  BMI 32.63 kg/m2  SpO2 100% O2 sats: stable throughout Complications: No apparent complications Patient did tolerate procedure well Tracheostomy Brand:Shiley Tracheostomy Style:Cuffed Tracheostomy Size: 6 Tracheostomy Secured [id number] Tracheostomy Placement Confirmation:Trach cuff visualized and in place    Ciro Backer 10/10/2014, 1:49 PM

## 2014-10-11 ENCOUNTER — Inpatient Hospital Stay
Admission: AD | Admit: 2014-10-11 | Discharge: 2014-11-12 | Disposition: A | Discharge status: Skilled Nursing Facility | Payer: Medicare Other | Source: Ambulatory Visit | Attending: Internal Medicine | Admitting: Internal Medicine

## 2014-10-11 ENCOUNTER — Other Ambulatory Visit (HOSPITAL_COMMUNITY): Payer: Self-pay

## 2014-10-11 DIAGNOSIS — G4733 Obstructive sleep apnea (adult) (pediatric): Secondary | ICD-10-CM | POA: Diagnosis present

## 2014-10-11 DIAGNOSIS — J9690 Respiratory failure, unspecified, unspecified whether with hypoxia or hypercapnia: Secondary | ICD-10-CM

## 2014-10-11 DIAGNOSIS — J449 Chronic obstructive pulmonary disease, unspecified: Secondary | ICD-10-CM | POA: Diagnosis present

## 2014-10-11 DIAGNOSIS — K567 Ileus, unspecified: Secondary | ICD-10-CM

## 2014-10-11 DIAGNOSIS — R918 Other nonspecific abnormal finding of lung field: Secondary | ICD-10-CM

## 2014-10-11 DIAGNOSIS — Z931 Gastrostomy status: Secondary | ICD-10-CM

## 2014-10-11 DIAGNOSIS — J440 Chronic obstructive pulmonary disease with acute lower respiratory infection: Secondary | ICD-10-CM | POA: Diagnosis present

## 2014-10-11 DIAGNOSIS — Z9289 Personal history of other medical treatment: Secondary | ICD-10-CM

## 2014-10-11 DIAGNOSIS — R52 Pain, unspecified: Secondary | ICD-10-CM

## 2014-10-11 DIAGNOSIS — Z930 Tracheostomy status: Secondary | ICD-10-CM

## 2014-10-11 DIAGNOSIS — L0291 Cutaneous abscess, unspecified: Secondary | ICD-10-CM

## 2014-10-11 DIAGNOSIS — N2889 Other specified disorders of kidney and ureter: Secondary | ICD-10-CM

## 2014-10-11 DIAGNOSIS — E662 Morbid (severe) obesity with alveolar hypoventilation: Secondary | ICD-10-CM | POA: Diagnosis present

## 2014-10-11 DIAGNOSIS — E46 Unspecified protein-calorie malnutrition: Secondary | ICD-10-CM | POA: Insufficient documentation

## 2014-10-11 DIAGNOSIS — J9620 Acute and chronic respiratory failure, unspecified whether with hypoxia or hypercapnia: Secondary | ICD-10-CM

## 2014-10-11 DIAGNOSIS — R1011 Right upper quadrant pain: Secondary | ICD-10-CM

## 2014-10-11 LAB — GLUCOSE, CAPILLARY
GLUCOSE-CAPILLARY: 93 mg/dL (ref 70–99)
GLUCOSE-CAPILLARY: 177 mg/dL — AB (ref 70–99)
Glucose-Capillary: 127 mg/dL — ABNORMAL HIGH (ref 70–99)
Glucose-Capillary: 140 mg/dL — ABNORMAL HIGH (ref 70–99)
Glucose-Capillary: 147 mg/dL — ABNORMAL HIGH (ref 70–99)

## 2014-10-11 LAB — BASIC METABOLIC PANEL
Anion gap: 7 (ref 5–15)
BUN: 19 mg/dL (ref 6–23)
CALCIUM: 9.2 mg/dL (ref 8.4–10.5)
CO2: 33 mEq/L — ABNORMAL HIGH (ref 19–32)
CREATININE: 0.44 mg/dL — AB (ref 0.50–1.10)
Chloride: 108 mEq/L (ref 96–112)
GFR calc Af Amer: >90 mL/min (ref >90)
GLUCOSE: 128 mg/dL — AB (ref 70–99)
Potassium: 4 mEq/L (ref 3.7–5.3)
SODIUM: 148 meq/L — AB (ref 137–147)

## 2014-10-11 LAB — CBC
HCT: 24.8 % — ABNORMAL LOW (ref 36.0–46.0)
Hemoglobin: 7.2 g/dL — ABNORMAL LOW (ref 12.0–15.0)
MCH: 24.4 pg — ABNORMAL LOW (ref 26.0–34.0)
MCHC: 29 g/dL — AB (ref 30.0–36.0)
MCV: 84.1 fL (ref 78.0–100.0)
Platelets: 214 10*3/uL (ref 150–400)
RBC: 2.95 MIL/uL — ABNORMAL LOW (ref 3.87–5.11)
RDW: 15.9 % — AB (ref 11.5–15.5)
WBC: 5.2 10*3/uL (ref 4.0–10.5)

## 2014-10-11 LAB — RETICULOCYTES
RBC.: 3.19 MIL/uL — ABNORMAL LOW (ref 3.87–5.11)
Retic Count, Absolute: 51 10*3/uL (ref 19.0–186.0)
Retic Ct Pct: 1.6 % (ref 0.4–3.1)

## 2014-10-11 LAB — FERRITIN: FERRITIN: 46 ng/mL (ref 10–291)

## 2014-10-11 MED ORDER — ALBUTEROL SULFATE (2.5 MG/3ML) 0.083% IN NEBU: 2.5000 mg | INHALATION_SOLUTION | RESPIRATORY_TRACT | Status: AC | PRN

## 2014-10-11 MED ORDER — LORAZEPAM 2 MG/ML IJ SOLN: 1.0000 mg | INTRAMUSCULAR | Status: AC | PRN

## 2014-10-11 MED ORDER — ENOXAPARIN SODIUM 40 MG/0.4ML ~~LOC~~ SOLN: 40.0000 mg | SUBCUTANEOUS | Status: AC

## 2014-10-11 MED ORDER — PRO-STAT SUGAR FREE PO LIQD: 30.0000 mL | Freq: Two times a day (BID) | ORAL | Status: AC

## 2014-10-11 MED ORDER — VANCOMYCIN HCL IN DEXTROSE 1-5 GM/200ML-% IV SOLN: 1000.0000 mg | Freq: Two times a day (BID) | INTRAVENOUS | Status: AC

## 2014-10-11 MED ORDER — FENTANYL BOLUS VIA INFUSION: 25.0000 ug | INTRAVENOUS | Status: AC | PRN

## 2014-10-11 MED ORDER — INSULIN ASPART 100 UNIT/ML ~~LOC~~ SOLN: 0.0000 [IU] | SUBCUTANEOUS | Status: AC

## 2014-10-11 MED ORDER — VITAL AF 1.2 CAL PO LIQD: 1000.0000 mL | ORAL | Status: AC

## 2014-10-11 MED ORDER — IPRATROPIUM-ALBUTEROL 0.5-2.5 (3) MG/3ML IN SOLN: 3.0000 mL | Freq: Four times a day (QID) | RESPIRATORY_TRACT | Status: AC

## 2014-10-11 MED ORDER — CHLORHEXIDINE GLUCONATE 0.12 % MT SOLN: 15.0000 mL | Freq: Two times a day (BID) | OROMUCOSAL | Status: AC

## 2014-10-11 MED ORDER — RISPERIDONE 0.5 MG PO TABS: 0.5000 mg | ORAL_TABLET | Freq: Every day | ORAL | Status: AC

## 2014-10-11 MED ORDER — PANTOPRAZOLE SODIUM 40 MG PO PACK: 40.0000 mg | PACK | ORAL | Status: AC

## 2014-10-11 NOTE — Progress Notes (Signed)
Pt transferred to [facility].  Receiving nurse in room upon arrival.  RT accompanied.  Pt's VS remained WNL during transfer.

## 2014-10-11 NOTE — Clinical Social Work Note (Signed)
Discussion about patient at length of stay meeting.  [facility] once orders have been received.    Debbie Pena. [place], MSW, [place] 10/11/2014 1:54 PM

## 2014-10-11 NOTE — Progress Notes (Signed)
PULMONARY / CRITICAL CARE MEDICINE   Name: Debbie Pena MRN: [id number] DOB: January 02, 1946    ADMISSION DATE:  09/22/2014  CHIEF COMPLAINT:  Short of breath  INITIAL PRESENTATION:  68 yo female former smoker transferred from [facility] with VDRF and RLL PNA complicated by PEA with hx of COPD, OSA/OHS.  Developed respiratory arrest 11/01 and transfer back to ICU.  STUDIES:  10/13 CT chest >> RLL infiltrate, B small effusions, L renal angiolipoma 10/18 CT head >> Chronic R BG lacunar infarct vs dilated prevascular space. No evidence CVA or global anoxia 10/29 Doppler legs >> no DVT 10/30 Echo >> mild LVH, EF 86%, mild RV systolic dysfx 76/19 CT chest >> no PE, b/l ATX with small b/l effusions  SIGNIFICANT EVENTS: 10/17 Transfer to [facility] 10/26 Extubated 10/29 Transfer out of ICU 11/01 Altered mental status, respiratory >> was off CPAP/oxygen 11/02 Off pressors 11/04 Tracheostomy by JY  SUBJECTIVE:  Tolerating pressure support.  VITAL SIGNS: Temp:  [97.9 F (36.6 C)-99.2 F (37.3 C)] 99.2 F (37.3 C) (11/05 0809) Pulse Rate:  [68-121] 102 (11/05 0800) Resp:  [16-25] 20 (11/05 0800) BP: (90-152)/(43-92) 108/49 mmHg (11/05 0719) SpO2:  [100 %] 100 % (11/05 0800) FiO2 (%):  [40 %] 40 % (11/05 0800) Weight:  [175 lb 14.8 oz (79.8 kg)] 175 lb 14.8 oz (79.8 kg) (11/05 0500) VENTILATOR SETTINGS: Vent Mode:  [-] PRVC FiO2 (%):  [40 %] 40 % Set Rate:  [16 bmp] 16 bmp Vt Set:  [320 mL] 320 mL PEEP:  [5 cmH20] 5 cmH20 Plateau Pressure:  [26 cmH20] 26 cmH20 INTAKE / OUTPUT:  Intake/Output Summary (Last 24 hours) at 10/11/14 1011 Last data filed at 10/11/14 0945  Gross per 24 hour  Intake   2797 ml  Output    485 ml  Net   2312 ml    PHYSICAL EXAMINATION: General: no distress, less anxious this AM Neuro:  RASS 0, normal strength, moves extremities HEENT: trach site clean Cardiovascular:  Regular, no murmur Lungs: no wheeze Abdomen:  Soft, non tender Musculoskeletal:  no  edema Skin:  No rashes  LABS:  CBC  Recent Labs Lab 10/09/14 1600 10/10/14 0420 10/11/14 0420  WBC 5.7 4.6 5.2  HGB 7.4* 7.6* 7.2*  HCT 25.4* 25.9* 24.8*  PLT 262 229 214   BMET  Recent Labs Lab 10/09/14 1600 10/10/14 0420 10/11/14 0420  NA 148* 151* 148*  K 3.8 3.5* 4.0  CL 105 108 108  CO2 37* 36* 33*  BUN 15 20 19   CREATININE 0.45* 0.44* 0.44*  GLUCOSE 130* 100* 128*   Electrolytes  Recent Labs Lab 10/09/14 1600 10/10/14 0420 10/11/14 0420  CALCIUM 9.2 9.3 9.2  MG 1.6  --   --    ABG  Recent Labs Lab 10/07/14 2329 10/08/14 0242  PHART 7.131* 7.436  PCO2ART 105.0* 54.0*  PO2ART 288.0* 154.0*   Liver Enzymes  Recent Labs Lab 10/08/14 0350  AST 11  ALT 16  ALKPHOS 92  BILITOT <0.2*  ALBUMIN 2.7*   Glucose  Recent Labs Lab 10/10/14 1227 10/10/14 1613 10/10/14 1924 10/10/14 2359 10/11/14 0359 10/11/14 0807  GLUCAP 97 84 119* 147* 127* 140*    Imaging Dg Chest Port 1 View  10/10/2014   CLINICAL DATA:  Respiratory failure, new tracheostomy tube.  EXAM: PORTABLE CHEST - 1 VIEW  COMPARISON:  10/09/2014 and earlier.  FINDINGS: A tracheostomy tube overlies airway, tip is at the level the clavicles. A left approach PICC line catheter  tip overlies the SVC.  The lung volumes are diminished.  The cardiac silhouette and mediastinal contours are unchanged.  Bilateral pleural effusions are present, right greater than left. Right lung base consolidation is similar to prior. There is no pneumothorax.  Degenerative changes of the spine are noted.  IMPRESSION: 1. Interval tracheostomy tube placement. 2. Bilateral pleural effusions, right greater than left. 3. Bibasilar consolidation may represent associated compressive atelectasis or pneumonitis.   Electronically Signed   By: Rosemarie Ax   On: 10/10/2014 13:43   Dg Abd Portable 1v  10/10/2014   CLINICAL DATA:  Feeding tube placement  EXAM: PORTABLE ABDOMEN - 1 VIEW  COMPARISON:  None.  FINDINGS:  Feeding tube tip is in the distal stomach with guidewire in place. There is contrast in multiple colonic diverticula. The overall bowel gas pattern is unremarkable. No obstruction or free air is seen on this supine examination.  IMPRESSION: Feeding tube tip is in the region of the distal stomach.   Electronically Signed   By: Lowella Grip M.D.   On: 10/10/2014 14:07      ASSESSMENT / PLAN:  PULMONARY ETT 10/16 >> 10/26  ETT 11/02 >> 11/04 Lurline Idol Hyman Bible) 11/04 >>  A: Acute on chronic hypoxic/hypercapnic respiratory failure 2nd to HCAP, ATX, COPD, OSA, OHS, and non compliance. P:   Pressure support to trach collar wean as tolerated >> will need vent support at night F/u CXR intermittently Continue scheduled BDs  CARDIOVASCULAR Lt PICC 10/20 >>  A:  Hx of HLD. P:  Continue lipitor  RENAL A:   Hypervolemia >> resolved. Hypernatremia >> improved 11/05. P:   Monitor renal fx, urine outpt, electrolytes Hold further diuresis  GASTROINTESTINAL A:   Nutrition. P:   Tube feeds while on vent Protonix for SUP  HEMATOLOGIC A:   Anemia of critical illness >> no evidence for bleeding. P:  F/u CBC Lovenox for DVT prevention Check iron studies  INFECTIOUS A:   HCAP s/p tx completed 10/19. Enterococcus in urine cx 11/02. P:   Vancomycin started 11/03  ENDOCRINE A:   Hyperglycemia >> HbA1c 6.5 from 11/02. P:   SSI  NEUROLOGIC A:   Acute encephalopathy 2nd to respiratory failure. Hx of schizophrenia, CVA. P:   RASS goal: 0 Continue risperdal  TODAY'S SUMMARY:  She would be excellent candidate for LTAC to assist with vent weaning, speech therapy and physical therapy.  She is ready for transfer to [facility] when bed available >> family is agreeable to transfer to [facility].  Chesley Mires, MD [facility] Pulmonary/Critical Care 10/11/2014, 10:11 AM Pager:  [phone] After 3pm call: [phone]

## 2014-10-11 NOTE — Progress Notes (Signed)
Per [facility] liaison, pt appropriate for transfer back to [facility] when medically stable.

## 2014-10-11 NOTE — Discharge Summary (Signed)
Physician Discharge Summary  Patient ID: Debbie Pena MRN: [id number] DOB/AGE: Jun 29, 1946 68 y.o.  Admit date: 09/22/2014 Discharge date: 10/11/2014    Discharge Diagnoses:  Active Problems:   Acute respiratory failure with hypoxia and hypercarbia   COPD (chronic obstructive pulmonary disease)   Obesity hypoventilation syndrome   OSA (obstructive sleep apnea)   HCAP (healthcare-associated pneumonia)   Acute respiratory failure   Chronic obstructive pulmonary disease with acute lower respiratory infection    Brief Summary: Debbie Pena is a 68 y.o. y/o female with a PMH of COPD, OSA/OHS initially admitted to [facility] 10/16 after hospitalization for VDRF in setting RLL PNA. Developed progressive resp failure 10/17 despite BiPAP, resulted in resp arrest and PEA < 10 minutes. Pt intubated, stabilized and transferred to [facility] ICU for further care. She was treated with broad spectrum abx and diuresis and initially improved and was extubated 10/26 and tx to floor.  Course was c/b tachycardia, AKI (resolved), ongoing encephalopathy and severe deconditioning.  On 11/1 the patient worsening with respiratory distress and lethargy, thought secondary to ?aspiration and decompensated OSA/OHS.  She ultimately required re-intubation and tx back to ICU.  She was stabilized and underwent tracheostomy placement 11/4 .  At the time of d/c she is tolerating some PS wean, mental status is much improved.  She remains severely deconditioned and will need ongoing care for vent weaning and rehab efforts.  She medically cleared for d/c back to [facility].    Consults: none  TUBES/LINES:  ETT 10/16 >> 10/26  ETT 11/02 >> 11/04 Trach (JY) 11/04 >> L PICC 10/20>>>  STUDIES:  10/13 CT chest >> RLL infiltrate, B small effusions, L renal angiolipoma 10/18 CT head >> Chronic R BG lacunar infarct vs dilated prevascular space. No evidence CVA or global anoxia 10/29 Doppler legs >> no DVT 10/30  Echo >> mild LVH, EF 40%, mild RV systolic dysfx 98/11 CT chest >> no PE, b/l ATX with small b/l effusions  SIGNIFICANT EVENTS: 10/17 Transfer to [facility] 10/26 Extubated 10/29 Transfer out of ICU 11/01 Altered mental status, respiratory >> was off CPAP/oxygen 11/02 Off pressors 11/04 Tracheostomy by JY  MICRO:  Urine 11/2>>> enterococcus  Sputum 11/2>>normal flora  CDiff 10/20>>>neg  BCx2 10/17>>>neg                                                                    D/c plan by Discharge Diagnosis  Acute on chronic hypoxic/hypercapnic respiratory failure 2nd to HCAP, ATX, COPD, OSA, OHS, and non compliance. P:  Pressure support to trach collar wean as tolerated >> will need vent support at night F/u CXR intermittently Continue scheduled BDs No decannulation F/u in trach clinic  Will need outpt pulm f/u - ?if she has MD in [place]   Hx of HLD. P:  Continue statin    Hypervolemia >> resolved. Hypernatremia >> improved 11/05. P:  Monitor renal fx, urine outpt, electrolytes Hold further diuresis   Nutrition. P:  Tube feeds while on vent Protonix for SUP   Anemia of critical illness >> no evidence for bleeding. P:  F/u CBC Lovenox for DVT prevention    HCAP s/p tx completed 10/19. Enterococcus in urine cx 11/02. P:  Continue Vancomycin (started 11/03)  Hyperglycemia >> HbA1c 6.5 from 11/02. P:  SSI   Acute encephalopathy 2nd to respiratory failure. Hx of schizophrenia, CVA. Anxiety  P:  RASS goal: 0 Continue risperdal qhs  Consider low dose enteral rx if anxiety remains an issue    Filed Vitals:   10/11/14 0719 10/11/14 0730 10/11/14 0800 10/11/14 0809  BP: 108/49     Pulse: 91 121 102   Temp:    99.2 F (37.3 C)  TempSrc:    Oral  Resp: $Remo'17 18 20   'VFado$ Height:      Weight:      SpO2: 100% 100% 100%      Discharge Labs  BMET  Recent Labs Lab 10/08/14 0350 10/09/14 0400 10/09/14 1600 10/10/14 0420 10/11/14 0420  NA  144 146 148* 151* 148*  K 3.9 2.8* 3.8 3.5* 4.0  CL 103 105 105 108 108  CO2 32 34* 37* 36* 33*  GLUCOSE 151* 131* 130* 100* 128*  BUN $Re'8 14 15 20 19  'GSa$ CREATININE 0.41* 0.44* 0.45* 0.44* 0.44*  CALCIUM 9.1 8.9 9.2 9.3 9.2  MG  --   --  1.6  --   --      CBC   Recent Labs Lab 10/09/14 1600 10/10/14 0420 10/11/14 0420  HGB 7.4* 7.6* 7.2*  HCT 25.4* 25.9* 24.8*  WBC 5.7 4.6 5.2  PLT 262 229 214   Anti-Coagulation  Recent Labs Lab 10/10/14 0420  INR 1.10           Follow-up Information    Follow up with  [facility].   Specialty:  Respiratory Therapy   Why:  trach clinic -- f/u 3-4 weeks post d/c from [facility]    Contact information:   [address] [id number] [place] [phone]         Medication List    STOP taking these medications        acetaZOLAMIDE 250 MG tablet  Commonly known as:  DIAMOX     BREO ELLIPTA 100-25 MCG/INH Aepb  Generic drug:  Fluticasone Furoate-Vilanterol     budesonide 0.5 MG/2ML nebulizer solution  Commonly known as:  PULMICORT     famotidine 20 MG tablet  Commonly known as:  PEPCID     LEVAQUIN 750 MG/150ML Soln  Generic drug:  levofloxacin     metoprolol tartrate 25 MG tablet  Commonly known as:  LOPRESSOR     PERFOROMIST 20 MCG/2ML nebulizer solution  Generic drug:  formoterol     predniSONE 20 MG tablet  Commonly known as:  DELTASONE     PROTONIX 40 MG injection  Generic drug:  pantoprazole  Replaced by:  pantoprazole sodium 40 mg/20 mL Pack     verapamil 180 MG (CO) 24 hr tablet  Commonly known as:  COVERA HS      TAKE these medications        albuterol (2.5 MG/3ML) 0.083% nebulizer solution  Commonly known as:  PROVENTIL  Take 3 mLs (2.5 mg total) by nebulization every 3 (three) hours as needed for wheezing or shortness of breath.     aspirin 81 MG tablet  Take 81 mg by mouth daily.     atorvastatin 20 MG tablet  Commonly  known as:  LIPITOR  Take 20 mg by mouth daily.     chlorhexidine 0.12 % solution  Commonly known as:  PERIDEX  15 mLs by Mouth Rinse route 2 (two) times daily.     enoxaparin  40 MG/0.4ML injection  Commonly known as:  LOVENOX  Inject 0.4 mLs (40 mg total) into the skin daily.     feeding supplement (ENSURE COMPLETE) Liqd  Take 237 mLs by mouth 2 (two) times daily between meals.     feeding supplement (VITAL AF 1.2 CAL) Liqd  Place 1,000 mLs into feeding tube continuous.     feeding supplement (PRO-STAT SUGAR FREE 64) Liqd  Place 30 mLs into feeding tube 2 (two) times daily at 10 AM and 5 PM.     fentaNYL Soln  Commonly known as:  SUBLIMAZE  Inject 25-50 mcg into the vein every hour as needed.     insulin aspart 100 UNIT/ML injection  Commonly known as:  novoLOG  Inject 0-15 Units into the skin every 4 (four) hours.     ipratropium-albuterol 0.5-2.5 (3) MG/3ML Soln  Commonly known as:  DUONEB  Take 3 mLs by nebulization every 6 (six) hours.     LORazepam 2 MG/ML injection  Commonly known as:  ATIVAN  Inject 0.5 mLs (1 mg total) into the vein every 4 (four) hours as needed for anxiety.     pantoprazole sodium 40 mg/20 mL Pack  Commonly known as:  PROTONIX  Place 20 mLs (40 mg total) into feeding tube daily.     risperiDONE 0.5 MG tablet  Commonly known as:  RISPERDAL  Take 1 tablet (0.5 mg total) by mouth at bedtime.     vancomycin 1 GM/200ML Soln  Commonly known as:  VANCOCIN  Inject 200 mLs (1,000 mg total) into the vein every 12 (twelve) hours.          Disposition: 95-DC/txfr to another health care institution with planned acute care hosp IP readmit - LTAC  Discharged Condition: Debbie Pena has met maximum benefit of inpatient care and is medically stable and cleared for discharge.  Patient is pending follow up as above.      Time spent on disposition:  Greater than 35 minutes.   SignedDarlina Sicilian, NP 10/11/2014  10:52 AM Pager: [phone]  [phone] or [phone]   Chesley Mires, MD [place] 10/11/2014, 11:27 AM Pager:  [phone] After 3pm call: [phone]

## 2014-10-11 NOTE — Progress Notes (Signed)
Transported pt from 2S05 to [facility] room 27 on ventilator. Pt stable throughout transport. Report given to Quay Burow. RRT, RCP

## 2014-10-11 NOTE — Progress Notes (Signed)
Report was called to [place].  All questions and concerns addressed.  RT notified of pending transfer.  Henri[place], Case management, had spoken with the pt's daughter, Alwyn Ren, to obtain consent to transfer the pt to [facility].

## 2014-10-12 LAB — HEPATIC FUNCTION PANEL
ALT: 10 U/L (ref 0–35)
AST: 10 U/L (ref 0–37)
Albumin: 2.4 g/dL — ABNORMAL LOW (ref 3.5–5.2)
Alkaline Phosphatase: 84 U/L (ref 39–117)
Bilirubin, Direct: <0.2 mg/dL (ref 0.0–0.3)
Total Bilirubin: <0.2 mg/dL — ABNORMAL LOW (ref 0.3–1.2)
Total Protein: 5.6 g/dL — ABNORMAL LOW (ref 6.0–8.3)

## 2014-10-12 LAB — CBC WITH DIFFERENTIAL/PLATELET
Basophils Absolute: 0 10*3/uL (ref 0.0–0.1)
Basophils Relative: 0 % (ref 0–1)
EOS PCT: 3 % (ref 0–5)
Eosinophils Absolute: 0.2 10*3/uL (ref 0.0–0.7)
HCT: 25.6 % — ABNORMAL LOW (ref 36.0–46.0)
Hemoglobin: 7.5 g/dL — ABNORMAL LOW (ref 12.0–15.0)
LYMPHS ABS: 0.6 10*3/uL — AB (ref 0.7–4.0)
Lymphocytes Relative: 10 % — ABNORMAL LOW (ref 12–46)
MCH: 24.4 pg — ABNORMAL LOW (ref 26.0–34.0)
MCHC: 29.3 g/dL — ABNORMAL LOW (ref 30.0–36.0)
MCV: 83.4 fL (ref 78.0–100.0)
MONOS PCT: 8 % (ref 3–12)
Monocytes Absolute: 0.5 10*3/uL (ref 0.1–1.0)
NEUTROS PCT: 79 % — AB (ref 43–77)
Neutro Abs: 4.7 10*3/uL (ref 1.7–7.7)
Platelets: 229 10*3/uL (ref 150–400)
RBC: 3.07 MIL/uL — AB (ref 3.87–5.11)
RDW: 15.7 % — ABNORMAL HIGH (ref 11.5–15.5)
WBC: 6 10*3/uL (ref 4.0–10.5)

## 2014-10-12 LAB — IRON AND TIBC
Iron: 11 ug/dL — ABNORMAL LOW (ref 42–135)
Iron: 14 ug/dL — ABNORMAL LOW (ref 42–135)
SATURATION RATIOS: 4 % — AB (ref 20–55)
Saturation Ratios: 6 % — ABNORMAL LOW (ref 20–55)
TIBC: 268 ug/dL (ref 250–470)
TIBC: 243 ug/dL — AB (ref 250–470)
UIBC: 257 ug/dL (ref 125–400)
UIBC: 229 ug/dL (ref 125–400)

## 2014-10-12 LAB — BASIC METABOLIC PANEL
ANION GAP: 11 (ref 5–15)
BUN: 13 mg/dL (ref 6–23)
CO2: 32 mEq/L (ref 19–32)
Calcium: 9 mg/dL (ref 8.4–10.5)
Chloride: 103 mEq/L (ref 96–112)
Creatinine, Ser: 0.41 mg/dL — ABNORMAL LOW (ref 0.50–1.10)
GFR calc non Af Amer: >90 mL/min (ref >90)
Glucose, Bld: 146 mg/dL — ABNORMAL HIGH (ref 70–99)
POTASSIUM: 3.4 meq/L — AB (ref 3.7–5.3)
Sodium: 146 mEq/L (ref 137–147)

## 2014-10-12 LAB — URINE CULTURE

## 2014-10-12 LAB — VANCOMYCIN, TROUGH: VANCOMYCIN TR: 13.5 ug/mL (ref 10.0–20.0)

## 2014-10-12 LAB — T4, FREE: FREE T4: 1.52 ng/dL (ref 0.80–1.80)

## 2014-10-12 LAB — T3, FREE: T3, Free: 3.1 pg/mL (ref 2.3–4.2)

## 2014-10-12 LAB — TSH: TSH: 3.02 u[IU]/mL (ref 0.350–4.500)

## 2014-10-13 ENCOUNTER — Encounter: Payer: Self-pay | Admitting: Radiology

## 2014-10-13 ENCOUNTER — Other Ambulatory Visit (HOSPITAL_COMMUNITY): Payer: Self-pay

## 2014-10-13 LAB — BASIC METABOLIC PANEL
Anion gap: 10 (ref 5–15)
BUN: 13 mg/dL (ref 6–23)
CALCIUM: 8.8 mg/dL (ref 8.4–10.5)
CHLORIDE: 98 meq/L (ref 96–112)
CO2: 33 meq/L — AB (ref 19–32)
CREATININE: 0.46 mg/dL — AB (ref 0.50–1.10)
GFR calc Af Amer: >90 mL/min (ref >90)
GFR calc non Af Amer: >90 mL/min (ref >90)
GLUCOSE: 124 mg/dL — AB (ref 70–99)
Potassium: 3.3 mEq/L — ABNORMAL LOW (ref 3.7–5.3)
Sodium: 141 mEq/L (ref 137–147)

## 2014-10-13 MED ORDER — IOHEXOL 300 MG/ML  SOLN
100.0000 mL | Freq: Once | INTRAMUSCULAR | Status: AC | PRN
  Administered 2014-10-13: 100 mL via INTRAVENOUS

## 2014-10-14 DIAGNOSIS — E46 Unspecified protein-calorie malnutrition: Secondary | ICD-10-CM | POA: Insufficient documentation

## 2014-10-14 LAB — BASIC METABOLIC PANEL
Anion gap: 10 (ref 5–15)
BUN: 14 mg/dL (ref 6–23)
CALCIUM: 9 mg/dL (ref 8.4–10.5)
CHLORIDE: 102 meq/L (ref 96–112)
CO2: 31 meq/L (ref 19–32)
CREATININE: 0.53 mg/dL (ref 0.50–1.10)
GFR calc Af Amer: >90 mL/min (ref >90)
GFR calc non Af Amer: >90 mL/min (ref >90)
GLUCOSE: 129 mg/dL — AB (ref 70–99)
Potassium: 3.7 mEq/L (ref 3.7–5.3)
Sodium: 143 mEq/L (ref 137–147)

## 2014-10-14 LAB — URINE MICROSCOPIC-ADD ON

## 2014-10-14 LAB — URINALYSIS, ROUTINE W REFLEX MICROSCOPIC
BILIRUBIN URINE: NEGATIVE
GLUCOSE, UA: NEGATIVE mg/dL
KETONES UR: NEGATIVE mg/dL
NITRITE: NEGATIVE
PH: 7 (ref 5.0–8.0)
Protein, ur: NEGATIVE mg/dL
SPECIFIC GRAVITY, URINE: 1.015 (ref 1.005–1.030)
Urobilinogen, UA: 1 mg/dL (ref 0.0–1.0)

## 2014-10-14 NOTE — Consult Note (Signed)
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 female  Pt with hx COPD Acute respiratory failure- transferred to [facility] for management +trach Now deconditioning PCM Encephalopathy Request for percutaneous gastric tube placement Dr Pascal Lux has reviewed imaging Feels anatomy appropriate for procedure Noted 7 cm L renal angiomyolipoma; cholelithiasis I have seen and examined pt Now scheduled for same 11/9  Past Medical History  Diagnosis Date  . COPD (chronic obstructive pulmonary disease)   . Obesity hypoventilation syndrome   . OSA (obstructive sleep apnea)   . HTN (hypertension)   . Schizoaffective disorder   . GERD (gastroesophageal reflux disease)   . Chronic respiratory failure with hypoxia   . Hyperlipidemia   . Renal angiolipoma   . Hepatic cyst     No past surgical history on file.  Allergies: Klonopin and Penicillins  Medications: Prior to Admission medications   Medication Sig Start Date End Date Taking? Authorizing Provider  albuterol (PROVENTIL) (2.5 MG/3ML) 0.083% nebulizer solution Take 3 mLs (2.5 mg total) by nebulization every 3 (three) hours as needed for wheezing or shortness of breath. 10/11/14   Marijean Heath, NP  Amino Acids-Protein Hydrolys (FEEDING SUPPLEMENT, PRO-STAT SUGAR FREE 64,) LIQD Place 30 mLs into feeding tube 2 (two) times daily at 10 AM and 5 PM. 10/11/14   Marijean Heath, NP  aspirin 81 MG tablet Take 81 mg by mouth daily.    Historical Provider, MD  atorvastatin (LIPITOR) 20 MG tablet Take 20 mg by mouth daily.    Historical Provider, MD  chlorhexidine (PERIDEX) 0.12 % solution 15 mLs by Mouth Rinse route 2 (two) times daily. 10/11/14   Marijean Heath, NP  enoxaparin (LOVENOX) 40 MG/0.4ML injection Inject 0.4 mLs (40 mg total) into the skin daily. 10/11/14   Marijean Heath, NP   feeding supplement, ENSURE COMPLETE, (ENSURE COMPLETE) LIQD Take 237 mLs by mouth 2 (two) times daily between meals. 10/07/14   Thurnell Lose, MD  fentaNYL (SUBLIMAZE) SOLN Inject 25-50 mcg into the vein every hour as needed. 10/11/14   Marijean Heath, NP  insulin aspart (NOVOLOG) 100 UNIT/ML injection Inject 0-15 Units into the skin every 4 (four) hours. 10/11/14   Marijean Heath, NP  ipratropium-albuterol (DUONEB) 0.5-2.5 (3) MG/3ML SOLN Take 3 mLs by nebulization every 6 (six) hours. 10/11/14   Marijean Heath, NP  LORazepam (ATIVAN) 2 MG/ML injection Inject 0.5 mLs (1 mg total) into the vein every 4 (four) hours as needed for anxiety. 10/11/14   Marijean Heath, NP  Nutritional Supplements (FEEDING SUPPLEMENT, VITAL AF 1.2 CAL,) LIQD Place 1,000 mLs into feeding tube continuous. 10/11/14   Marijean Heath, NP  pantoprazole sodium (PROTONIX) 40 mg/20 mL PACK Place 20 mLs (40 mg total) into feeding tube daily. 10/11/14   Marijean Heath, NP  risperiDONE (RISPERDAL) 0.5 MG tablet Take 1 tablet (0.5 mg total) by mouth at bedtime. 10/11/14   Marijean Heath, NP  vancomycin (VANCOCIN) 1 GM/200ML SOLN Inject 200 mLs (1,000 mg total) into the vein every 12 (twelve) hours. 10/11/14   Marijean Heath, NP    No family history on file.  History   Social History  . Marital Status: Single    Spouse Name: N/A    Number of Children: N/A  . Years of Education: N/A   Social History Main Topics  . Smoking status: Former Research scientist (life sciences)  .  Smokeless tobacco: None  . Alcohol Use: Yes  . Drug Use: No  . Sexual Activity: None   Other Topics Concern  . None   Social History Narrative     Review of Systems: A 12 point ROS discussed and pertinent positives are indicated in the HPI above.  All other systems are negative.  Review of Systems  Constitutional:       Trach dependent    Vital Signs: There were no vitals taken for this visit.  Physical Exam    Cardiovascular: Normal rate and regular rhythm.   No murmur heard. Pulmonary/Chest: Effort normal. She has wheezes.  Abdominal: Soft. Bowel sounds are normal. She exhibits no distension.  Musculoskeletal:  No response  Skin: Skin is warm and dry.  Psychiatric:  Consented daughters via phone  Nursing note and vitals reviewed.   Imaging: Ct Head Wo Contrast  09/23/2014   CLINICAL DATA:  Cardiac arrest  EXAM: CT HEAD WITHOUT CONTRAST  TECHNIQUE: Contiguous axial images were obtained from the base of the skull through the vertex without intravenous contrast.  COMPARISON:  None.  FINDINGS: Focal area of low attenuation in the right basal ganglia compatible with either a chronic lacunar infarct or dilated prevascular space. The cerebral and cerebellar hemispheres are otherwise normal in attenuation and morphology. No evidence for acute brain infarct, hemorrhage or mass. No abnormal extra-axial fluid collections identified. The paranasal sinuses and mastoid air cells are clear. The skull is intact.  IMPRESSION: 1. No acute intracranial abnormalities. 2. 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   Electronically Signed   By: Kerby Moors M.D.   On: 09/23/2014 14:37   Ct Angio Chest Pe W/cm &/or Wo Cm  10/05/2014   CLINICAL DATA:  Subsequent encounter for COPD exacerbation shortness of breath.  EXAM: CT ANGIOGRAPHY CHEST WITH CONTRAST  TECHNIQUE: Multidetector CT imaging of the chest was performed using the standard protocol during bolus administration of intravenous contrast. Multiplanar CT image reconstructions and MIPs were obtained to evaluate the vascular anatomy.  CONTRAST:  53mL OMNIPAQUE IOHEXOL 350 MG/ML SOLN  COMPARISON:  None.  FINDINGS: Soft tissue / Mediastinum: Left PICC line tip is at the proximal SVC level. There is no axillary lymphadenopathy. No mediastinal or hilar lymphadenopathy. There is calcified nodal tissue in the mediastinum and right suggesting  prior granulomatous disease.  Bovine arch vessel anatomy is a normal variant. There is no filling defect within the opacified pulmonary arteries to indicate the presence of an acute pulmonary embolus. No thoracic aortic aneurysm. There is no dissection of the thoracic aorta. The heart is enlarged. No pericardial effusion. Coronary artery calcification is noted.  Lungs / Pleura: No evidence for pulmonary edema. There is bilateral lower lobe collapse/consolidation with small bilateral pleural effusions.  Bones: Bone windows reveal no worrisome lytic or sclerotic osseous lesions.  Upper Abdomen: 4.1 cm water density lesion in the central liver is likely a cyst. 6.7 x 3.5 cm mixed fatty and soft tissue lesion is seen posterior to the left kidney.  Review of the MIP images confirms the above findings.  IMPRESSION: 1. No CT evidence for acute pulmonary embolus. 2. Bilateral lower lobe collapse/consolidation with small bilateral pleural effusions. 3. 6.7 cm fatty and soft tissue mass in the left retroperitoneal space, posterior to the left kidney. 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 Dedicated CT of abdomen and pelvis with  contrast is recommended to further evaluate as retroperitoneal liposarcoma could have this appearance. 4. 4.1 cm well-defined water density lesion in the central liver, most likely a cyst.   Electronically Signed   By: Misty Stanley M.D.   On: 10/05/2014 10:53   Ct Abdomen Pelvis W Contrast  10/13/2014   CLINICAL DATA:  Evaluate anatomy for possible G-tube.  EXAM: CT ABDOMEN AND PELVIS WITH CONTRAST  TECHNIQUE: Multidetector CT imaging of the abdomen and pelvis was performed using the standard protocol following bolus administration of intravenous contrast.  CONTRAST:  121mL OMNIPAQUE IOHEXOL 300 MG/ML  SOLN  COMPARISON:  None.  FINDINGS: LOWER CHEST: Small, layering pleural effusions  with extensive lower lobe atelectasis.  ABDOMEN/PELVIS:  Liver: 4 cm cyst in the central liver, causing mild segmental dilatation of the proximal biliary tree.  Biliary: Multiple gallstones, many containing gas. There is no gallbladder wall thickening.  Pancreas: Unremarkable.  Spleen: Unremarkable.  Adrenals: 7 mm nodule in the lateral limb left adrenal gland, nonspecific but usually from adenoma in this patient without history of malignancy.  Kidneys and ureters: There is a large soft tissue and fatty mass exophytic from the upper pole left kidney, measuring 6.8 x 3.6 cm in maximal axial dimension. At this size, there is risk of spontaneous hemorrhage, although no acute hemorrhage visible. No hydronephrosis or calculus.  Bladder: Decompressed by a Foley catheter.  Reproductive: 3 cm mass in the right cornual region consistent with fibroid. Negative adnexae.  Bowel: There is diffuse colonic diverticulosis. A sigmoid diverticulum near the left ovary has a thick wall but there is no surrounding inflammation to suggest acute diverticulitis. No bowel obstruction. Nasogastric tube is in good position. The stomach is in normal position. No evidence of gastric mass or ulceration. No hernia or other ventral abdominal wall impediment to percutaneous gastrostomy.  Retroperitoneum: No mass or adenopathy.  Peritoneum: No ascites or pneumoperitoneum.  Vascular: No acute abnormality.  OSSEOUS: No acute abnormalities.  IMPRESSION: 1. Normal gastric anatomy. No visible impediment to percutaneous gastrostomy. 2. 7 cm left renal angiomyolipoma, cholelithiasis, and other incidental findings noted above.   Electronically Signed   By: Jorje Guild M.D.   On: 10/13/2014 10:25   Dg Chest Port 1 View  10/10/2014   CLINICAL DATA:  Respiratory failure, new tracheostomy tube.  EXAM: PORTABLE CHEST - 1 VIEW  COMPARISON:  10/09/2014 and earlier.  FINDINGS: A tracheostomy tube overlies airway, tip is at the level the clavicles. A left  approach PICC line catheter tip overlies the SVC.  The lung volumes are diminished.  The cardiac silhouette and mediastinal contours are unchanged.  Bilateral pleural effusions are present, right greater than left. Right lung base consolidation is similar to prior. There is no pneumothorax.  Degenerative changes of the spine are noted.  IMPRESSION: 1. Interval tracheostomy tube placement. 2. Bilateral pleural effusions, right greater than left. 3. Bibasilar consolidation may represent associated compressive atelectasis or pneumonitis.   Electronically Signed   By: Rosemarie Ax   On: 10/10/2014 13:43   Dg Chest Port 1 View  10/09/2014   CLINICAL DATA:  Respiratory failure  EXAM: PORTABLE CHEST - 1 VIEW  COMPARISON:  10/08/2014  FINDINGS: Cardiac shadow is enlarged but stable. Bilateral pleural effusions are again noted. The endotracheal tube lies 3 cm above the carina. A left-sided PICC line is noted in satisfactory position within the proximal superior vena cava. A nasogastric catheter is noted within the stomach.  IMPRESSION: Bibasilar changes.  Tubes and lines  as described.   Electronically Signed   By: Inez Catalina M.D.   On: 10/09/2014 07:47   Dg Chest Port 1 View  10/08/2014   CLINICAL DATA:  Endotracheal tube retracted.  EXAM: PORTABLE CHEST - 1 VIEW  COMPARISON:  10/08/2014  FINDINGS: Endotracheal tube is been pulled back and now measures 2.2 cm above the carina. Persistent shallow inspiration with atelectasis or infiltration in the lung bases and bilateral pleural effusions as before.  IMPRESSION: Endotracheal tube tip now measures 2.2 cm above the carina.   Electronically Signed   By: Lucienne Capers M.D.   On: 10/08/2014 01:33   Dg Chest Port 1 View  10/08/2014   CLINICAL DATA:  Endotracheal tube placement  EXAM: PORTABLE CHEST - 1 VIEW  COMPARISON:  10/06/2014  FINDINGS: Interval placement of endotracheal tube. The tube tip is projected over the right mainstem bronchus. Some of this the may  be due to technique but withdrawal is recommended. Left PICC catheter is unchanged in position. Shallow inspiration. Atelectasis in both lung bases, increasing since previous study. Blunting of the costophrenic angles suggesting small effusions. No pneumothorax. Heart size and pulmonary vascularity are likely normal for technique.  IMPRESSION: Endotracheal tube tip projects over the right mainstem bronchus. Increasing bilateral atelectasis and effusions. Shallow inspiration.  These results were called by telephone at the time of interpretation on 10/08/2014 at 1:18 am to ICU nurse , who verbally acknowledged these results.   Electronically Signed   By: Lucienne Capers M.D.   On: 10/08/2014 01:20   Dg Chest Port 1 View  10/06/2014   CLINICAL DATA:  Cough and shortness of breath.  History of COPD.  EXAM: PORTABLE CHEST - 1 VIEW  COMPARISON:  10/02/2014  FINDINGS: Stable low lung volumes with bibasilar atelectasis. Potential small bilateral pleural effusions. No pulmonary edema. The heart size is stable.  IMPRESSION: Stable low lung volumes with bibasilar atelectasis. Possible small bilateral pleural effusions.   Electronically Signed   By: Aletta Edouard M.D.   On: 10/06/2014 08:07   Dg Chest Port 1 View  10/02/2014   CLINICAL DATA:  Respiratory failure with hypoxia.  EXAM: PORTABLE CHEST - 1 VIEW  COMPARISON:  10/01/2014 and 09/30/2014  FINDINGS: PICC tip has been retracted and is now in the superior vena cava at the level of the azygos vein.  There is persistent bibasilar atelectasis, increased on the right. Heart size and vascularity are normal. No acute osseous abnormality.  IMPRESSION: 1. Increased atelectasis the right lung base. 2. PICC tip has been retracted and is now in the upper superior vena cava.   Electronically Signed   By: Rozetta Nunnery M.D.   On: 10/02/2014 08:05   Dg Chest Port 1 View  10/01/2014   CLINICAL DATA:  Followup respiratory failure  EXAM: PORTABLE CHEST - 1 VIEW  COMPARISON:   Portable chest x-ray of 09/30/2014  FINDINGS: The lungs remain suboptimally aerated with mild basilar volume loss. The tip of the endotracheal tube is approximately 4.5 cm above the carina. Heart size is stable. Left central venous line is unchanged.  IMPRESSION: Little change in poor aeration with basilar atelectasis. Endotracheal tube and central venous line are unchanged in position.   Electronically Signed   By: Ivar Drape M.D.   On: 10/01/2014 08:03   Dg Chest Port 1 View  09/30/2014   CLINICAL DATA:  68 year old female with acute respiratory failure.  EXAM: PORTABLE CHEST - 1 VIEW  COMPARISON:  Chest x-ray 09/29/2014.  FINDINGS: An endotracheal tube is in place with tip 3.3 cm above the carina. There is a cleft upper extremity PICC with tip terminating in the superior cavoatrial junction. A nasogastric tube is seen extending into the stomach, however, the tip of the nasogastric tube extends below the lower margin of the image. Lung volumes are low, and there are bibasilar opacities which may reflect areas of atelectasis and/or consolidation. No definite pleural effusions. No evidence of pulmonary edema. Mild cardiomegaly. Upper mediastinal contours are distorted by low lung volumes, lordotic positioning and mild patient rotation.  IMPRESSION: 1. Support apparatus, as above. 2. Low lung volumes with persistent bibasilar atelectasis and/or consolidation.   Electronically Signed   By: Vinnie Langton M.D.   On: 09/30/2014 08:12   Dg Chest Port 1 View  09/29/2014   CLINICAL DATA:  Respiratory failure, ventilatory support, intubated  EXAM: PORTABLE CHEST - 1 VIEW  COMPARISON:  09/28/2014  FINDINGS: Exam is rotated to the right. Endotracheal tube 4 cm above the carina. Left PICC line tip proximal SVC. NG tube within the stomach.  Persistent cardiomegaly with improving bibasilar atelectasis and trace pleural effusions. Slightly better lung volumes. Upper lobes remain clear. No pneumothorax. Atherosclerosis  of the ectatic aorta.  IMPRESSION: Cardiomegaly with improving bibasilar atelectasis and residual trace effusions.   Electronically Signed   By: Daryll Brod M.D.   On: 09/29/2014 08:21   Dg Chest Port 1 View  09/28/2014   CLINICAL DATA:  Acute respiratory failure.  Subsequent encounter.  EXAM: PORTABLE CHEST - 1 VIEW  COMPARISON:  09/25/2014.  FINDINGS: Support apparatus: Endotracheal tube tip 31 mm from the carina. LEFT upper extremity PICC is present with the tip in the mid SVC, just inferior to the level of the carina. Enteric tube is present, at least in the stomach. Monitoring leads project over the chest.  Cardiomediastinal Silhouette:  Enlarged, unchanged.  Lungs: Allowing for technical differences, no interval change in pulmonary aeration. Basilar predominant airspace disease and atelectasis. No pneumothorax.  Effusions:  Small bilateral pleural effusions.  Other:  None.  IMPRESSION: 1. Support apparatus in good position. Interval placement of LEFT upper extremity PICC with the tip in the mid SVC. 2. No change in pulmonary aeration with basilar predominant airspace disease and atelectasis. Airspace disease is most compatible with pulmonary edema. 3. Small bilateral pleural effusions.   Electronically Signed   By: Dereck Ligas M.D.   On: 09/28/2014 08:00   Dg Chest Port 1 View  09/25/2014   CLINICAL DATA:  Respiratory failure, evaluate endotracheal tube position  EXAM: PORTABLE CHEST - 1 VIEW  COMPARISON:  Portable exam 1226 hr compared to 09/24/2014  FINDINGS: Tip of endotracheal tube projects 4.4 cm above carina.  Nasogastric tube extends into stomach.  Enlargement of cardiac silhouette with pulmonary vascular congestion.  Diffuse interstitial infiltrates the perihilar regions likely edema.  Bibasilar subsegmental atelectasis and small RIGHT pleural effusion.  No pneumothorax.  IMPRESSION: Persistent pulmonary edema with bibasilar atelectasis and small RIGHT pleural effusion.   Electronically  Signed   By: Lavonia Dana M.D.   On: 09/25/2014 12:43   Dg Chest Port 1 View  09/24/2014   CLINICAL DATA:  Respiratory failure; history of COPD and healthcare associated pneumonia  EXAM: PORTABLE CHEST - 1 VIEW  COMPARISON:  Portable chest x-ray of September 23, 2014  FINDINGS: The lungs remain hypoinflated. The interstitial markings are more conspicuous bilaterally. The hemidiaphragms remain obscured. The cardiopericardial silhouette remains enlarged. The pulmonary vascularity is engorged.  The endotracheal tube tip lies 3.6 cm above the crotch of the carina. The esophagogastric tube tip projects below the inferior margin of the image.  IMPRESSION: There has been slight interval deterioration in the appearance of the pulmonary interstitium consistent with progressive interstitial edema as well as bilateral pleural effusions secondary to CHF.   Electronically Signed   By: David  Martinique   On: 09/24/2014 07:56   Dg Chest Port 1 View  09/23/2014   CLINICAL DATA:  Evaluate ET tube location  EXAM: PORTABLE CHEST - 1 VIEW  COMPARISON:  09/22/2014  FINDINGS: Endotracheal tube tip is above the carina. There is a nasogastric tube with tip in the stomach. Lung volumes are low. There is cardiac enlargement, bilateral pleural effusions and interstitial edema this appears slightly increased from previous exam.  IMPRESSION: 1. CHF pattern appears increased from previous exam. 2. ET tube tip is in satisfactory position above the carina.   Electronically Signed   By: Kerby Moors M.D.   On: 09/23/2014 08:31   Dg Chest Port 1 View  09/22/2014   CLINICAL DATA:  Difficulty airway.  Initial encounter  EXAM: PORTABLE CHEST - 1 VIEW  COMPARISON:  09/21/2014  FINDINGS: Endotracheal tube at the level of the lower thoracic trachea, approximately 2 cm above the carina. Nasogastric tube or orogastric tube enters stomach.  Hazy and bandlike basilar lung opacities consistent with atelectasis. Probable small pleural effusion as well.  No pneumothorax. Stable heart size and mediastinal contours given differences in projection.  IMPRESSION: 1. After repositioning, endotracheal tube in good position. New orogastric tube is in good position. 2. Improved lung aeration compared to admission radiograph. There is persistent bibasilar atelectasis and pleural fluid.   Electronically Signed   By: Jorje Guild M.D.   On: 09/22/2014 22:35   Dg Chest Port 1 View  09/22/2014   CLINICAL DATA:  Code, respiratory failure, intubated  EXAM: PORTABLE CHEST - 1 VIEW  COMPARISON:  10.16.15  FINDINGS: ET tip into left main bronchus.Right middle lobe opacity improved from prior study. Improved but persistent mild left lower lobe atelectasis.  IMPRESSION: Endotracheal tube in left main bronchus. It should be retracted by about 4 cm with repeat chest radiography. Critical Value/emergent results were called by telephone at the time of interpretation on 09/22/2014 at 7:47 pm to Desire, the charge nurse, who verbally acknowledged these results.   Electronically Signed   By: Skipper Cliche M.D.   On: 09/22/2014 19:48   Dg Chest Port 1 View  09/21/2014   CLINICAL DATA:  Acute on chronic respiratory failure.  Pneumonia.  EXAM: PORTABLE CHEST - 1 VIEW  COMPARISON:  Apr 19, 2014.  FINDINGS: Stable cardiomegaly. Hypoinflation of the lungs is noted. Right pleural effusion is increased in size. Left pleural effusion is slightly improved, with subsegmental atelectasis seen in the left lung base. No pneumothorax is noted.  IMPRESSION: Improved left pleural effusion. Worsening right pleural effusion with probable associated atelectasis. Minimal left basilar subsegmental atelectasis is noted as well.   Electronically Signed   By: Sabino Dick M.D.   On: 09/21/2014 16:11   Dg Abd Portable 1v  10/11/2014   CLINICAL DATA:  Evaluate nasogastric tube placement.  EXAM: PORTABLE ABDOMEN - 1 VIEW  COMPARISON:  10/10/2014  FINDINGS: The feeding tube has been exchanged for a  nasogastric tube. The tip of the tube is near the gastric antrum. Again noted is residual contrast in the colon. There is a nonobstructive bowel gas pattern.  IMPRESSION:  Nasogastric tube tip in the distal stomach region.   Electronically Signed   By: Markus Daft M.D.   On: 10/11/2014 18:44   Dg Abd Portable 1v  10/10/2014   CLINICAL DATA:  Feeding tube placement  EXAM: PORTABLE ABDOMEN - 1 VIEW  COMPARISON:  None.  FINDINGS: Feeding tube tip is in the distal stomach with guidewire in place. There is contrast in multiple colonic diverticula. The overall bowel gas pattern is unremarkable. No obstruction or free air is seen on this supine examination.  IMPRESSION: Feeding tube tip is in the region of the distal stomach.   Electronically Signed   By: Lowella Grip M.D.   On: 10/10/2014 14:07   Dg Swallowing Func-speech Pathology  10/03/2014   Assunta Curtis, CCC-SLP     10/03/2014  3:08 PM Objective Swallowing Evaluation: Modified Barium Swallowing Study   Patient Details  Name: LAIRA PENNINGER MRN: [id number] Date of Birth: May 03, 1946  Today's Date: 10/03/2014 Time: 1430-1455 SLP Time Calculation (min): 25 min  Past Medical History:  Past Medical History  Diagnosis Date  . COPD (chronic obstructive pulmonary disease)   . Obesity hypoventilation syndrome   . OSA (obstructive sleep apnea)   . HTN (hypertension)   . Schizoaffective disorder   . GERD (gastroesophageal reflux disease)   . Chronic respiratory failure with hypoxia   . Hyperlipidemia   . Renal angiolipoma   . Hepatic cyst    Past Surgical History: History reviewed. No pertinent past  surgical history. HPI:  Pt with COPD and OSA/OHS, transferred to [facility] 10/16 from  [facility] after hospitalization for VDRF in setting RLL PNA.  Developed progressive resp failure 10/17 despite BiPAP, resulted  in resp arrest and PEA < 10 minutes. Pt intubated, stabilized and  transferred to [facility] ICU for further care.  ETT 10/17-10/26.   Bedside swallow eval  morning of 10/28 with recs for MBS>    Assessment / Plan / Recommendation Clinical Impression  Dysphagia Diagnosis: Within Functional Limits  Clinical impression: Pt's swallow function improved from time of  clinical assessment in am, with absence of clinical signs notable  this morning (RN reports pt is more alert this afternoon).  MBS  revealed adequate mastication, no delay in timing of swallow  response, sufficient pharyngeal mobility, and consistent  laryngeal closure.  Swallow mechanism was taxed with large,  successive thin liquid boluses, but no aspiration was observed.    Recommend initiating a dysphagia 3 diet with thin liquids; meds  whole in puree.   Monitor VS during first few meals to ensure [id number] remain stable  and that there are no recurring signs of poor PO toleration.  SLP  will f/u x1 to ensure consistency of toleration, then D/C if  swallow safety is maintained.     Treatment Recommendation  Therapy as outlined in treatment plan below    Diet Recommendation Dysphagia 3 (Mechanical Soft);Thin liquid   Liquid Administration via: Cup;Straw Medication Administration: Whole meds with puree Compensations: Slow rate;Small sips/bites Postural Changes and/or Swallow Maneuvers: Seated upright 90  degrees    Other  Recommendations Oral Care Recommendations: Oral care BID   Follow Up Recommendations  None    Frequency and Duration min 1 x/week  1 week     General Date of Onset: 09/22/14 HPI: Pt with COPD and OSA/OHS, transferred to [facility] 10/16 from  [facility] after hospitalization for VDRF in setting RLL PNA.  Developed progressive resp failure 10/17 despite BiPAP, resulted  in resp arrest and  PEA < 10 minutes. Pt intubated, stabilized and  transferred to [facility] ICU for further care.  ETT 10/17-10/26.  Type of Study: Modified Barium Swallowing Study Diet Prior to this Study: NPO Temperature Spikes Noted: No Respiratory Status: Room air History of Recent Intubation: Yes Length of Intubations (days): 9 days  Date extubated: 10/01/14 Behavior/Cognition: Alert;Cooperative;Pleasant mood Oral Cavity - Dentition: Adequate natural dentition Oral Motor / Sensory Function: Within functional limits Self-Feeding Abilities: Able to feed self;Needs set up Patient Positioning: Upright in chair Baseline Vocal Quality: Hoarse Volitional Cough: Weak Volitional Swallow: Able to elicit Anatomy: Within functional limits Pharyngeal Secretions: Not observed secondary MBS    Reason for Referral     Oral Phase Oral Preparation/Oral Phase Oral Phase: WFL   Pharyngeal Phase Pharyngeal Phase Pharyngeal Phase: Impaired Pharyngeal - Nectar Pharyngeal - Nectar Straw: Within functional limits Pharyngeal - Thin Pharyngeal - Thin Straw: Penetration/Aspiration during swallow Penetration/Aspiration details (thin straw): Material enters  airway, remains ABOVE vocal cords then ejected out Pharyngeal - Solids Pharyngeal - Puree: Within functional limits Pharyngeal - Mechanical Soft: Within functional limits  Cervical Esophageal Phase   Amanda L. Friona, [place] CCC/SLP Pager [phone]     Cervical Esophageal Phase Cervical Esophageal Phase: Darryll Capers         Juan Quam Laurice 10/03/2014, 3:07 PM     Labs:  CBC:  Recent Labs  10/09/14 1600 10/10/14 0420 10/11/14 0420 10/12/14 0600  WBC 5.7 4.6 5.2 6.0  HGB 7.4* 7.6* 7.2* 7.5*  HCT 25.4* 25.9* 24.8* 25.6*  PLT 262 229 214 229    COAGS:  Recent Labs  09/22/14 2139 10/10/14 0420  INR 1.16 1.10    BMP:  Recent Labs  10/11/14 0420 10/12/14 0600 10/13/14 0500 10/14/14 0738  NA 148* 146 141 143  K 4.0 3.4* 3.3* 3.7  CL 108 103 98 102  CO2 33* 32 33* 31  GLUCOSE 128* 146* 124* 129*  BUN 19 13 13 14   CALCIUM 9.2 9.0 8.8 9.0  CREATININE 0.44* 0.41* 0.46* 0.53  GFRNONAA >90 >90 >90 >90  GFRAA >90 >90 >90 >90    LIVER FUNCTION TESTS:  Recent Labs  09/24/14 0420 10/02/14 0337 10/08/14 0350 10/12/14 0600  BILITOT 0.3 <0.2* <0.2* <0.2*  AST 12 12 11 10   ALT 22 16 16 10     ALKPHOS 46 73 92 84  PROT 5.6* 7.1 5.8* 5.6*  ALBUMIN 2.7* 3.0* 2.7* 2.4*    TUMOR MARKERS: No results for input(s): AFPTM, CEA, CA199, CHROMGRNA in the last 8760 hours.  Assessment and Plan:  PCM Deconditioning Trach dependent Scheduled for G tube in IR 11/9 pts dtrs are aware of procedure benefits and risks and agreeable to proceed Consent signed andin chart On vanco Occasional low grade temp Lovenox held  Thank you for this interesting consult.  I greatly enjoyed meeting TALONDA ARTIST and look forward to participating in their care.    I spent a total of 40 minutes face to face in clinical consultation, greater than 50% of which was counseling/coordinating care for percutaneous gastric tube placement  Signed: Mehgan Santmyer A 10/14/2014, 8:37 AM

## 2014-10-15 ENCOUNTER — Other Ambulatory Visit (HOSPITAL_COMMUNITY): Payer: Self-pay

## 2014-10-15 DIAGNOSIS — Z930 Tracheostomy status: Secondary | ICD-10-CM

## 2014-10-15 DIAGNOSIS — J9611 Chronic respiratory failure with hypoxia: Secondary | ICD-10-CM

## 2014-10-15 LAB — CBC
HCT: 22 % — ABNORMAL LOW (ref 36.0–46.0)
HEMOGLOBIN: 6.5 g/dL — AB (ref 12.0–15.0)
MCH: 24.6 pg — ABNORMAL LOW (ref 26.0–34.0)
MCHC: 29.5 g/dL — ABNORMAL LOW (ref 30.0–36.0)
MCV: 83.3 fL (ref 78.0–100.0)
Platelets: 201 10*3/uL (ref 150–400)
RBC: 2.64 MIL/uL — ABNORMAL LOW (ref 3.87–5.11)
RDW: 15.9 % — ABNORMAL HIGH (ref 11.5–15.5)
WBC: 4.6 10*3/uL (ref 4.0–10.5)

## 2014-10-15 LAB — HEMOGLOBIN AND HEMATOCRIT, BLOOD
HEMATOCRIT: 26.5 % — AB (ref 36.0–46.0)
Hemoglobin: 8 g/dL — ABNORMAL LOW (ref 12.0–15.0)

## 2014-10-15 LAB — VANCOMYCIN, TROUGH: VANCOMYCIN TR: 29.3 ug/mL — AB (ref 10.0–20.0)

## 2014-10-15 MED ORDER — VANCOMYCIN HCL IN DEXTROSE 1-5 GM/200ML-% IV SOLN
1000.0000 mg | Freq: Once | INTRAVENOUS | Status: AC
Start: 2014-10-15 — End: 2014-10-15
  Administered 2014-10-15: 1000 mg via INTRAVENOUS
  Filled 2014-10-15: qty 200

## 2014-10-15 MED ORDER — MIDAZOLAM HCL 2 MG/2ML IJ SOLN
INTRAMUSCULAR | Status: AC | PRN
  Administered 2014-10-15 (×2): 1 mg via INTRAVENOUS

## 2014-10-15 MED ORDER — FENTANYL CITRATE 0.05 MG/ML IJ SOLN
INTRAMUSCULAR | Status: AC | PRN
  Administered 2014-10-15 (×2): 50 ug via INTRAVENOUS

## 2014-10-15 MED ORDER — IOHEXOL 300 MG/ML  SOLN
50.0000 mL | Freq: Once | INTRAMUSCULAR | Status: AC | PRN
  Administered 2014-10-15: 20 mL via INTRAVENOUS

## 2014-10-15 NOTE — Sedation Documentation (Signed)
Patient denies pain and is resting comfortably.  

## 2014-10-15 NOTE — Procedures (Signed)
Procedure:  Gastrostomy tube plaecment Findings:  58 Fr bumper retention gastrostomy tube placed with tip in body of stomach. OK to use in 24 hours.

## 2014-10-15 NOTE — Consult Note (Signed)
PULMONARY / CRITICAL CARE MEDICINE   Name: Debbie Pena MRN: [id number] DOB: 11-Jan-1946    ADMISSION DATE:  10/11/2014 CONSULTATION DATE: 11/9  REFERRING MD :  [facility]  CHIEF COMPLAINT:  Cardiac arrest  INITIAL PRESENTATION:Post arrest      TUBES/LINES:  ETT 10/16 >> 10/26  ETT 11/02 >> 11/04 Lurline Idol Hyman Bible) 11/04 >> L PICC 10/20>>> PEG planned for 11/9>>  STUDIES:  10/13 CT chest >> RLL infiltrate, B small effusions, L renal angiolipoma 10/18 CT head >> Chronic R BG lacunar infarct vs dilated prevascular space. No evidence CVA or global anoxia 10/29 Doppler legs >> no DVT 10/30 Echo >> mild LVH, EF 30%, mild RV systolic dysfx 09/23 CT chest >> no PE, b/l ATX with small b/l effusions  SIGNIFICANT EVENTS: 10/17 Transfer to [facility] 10/26 Extubated 10/29 Transfer out of ICU 11/01 Altered mental status, respiratory >> was off CPAP/oxygen 11/02 Off pressors 11/04 Tracheostomy by JY 11/5 tx to ssh. 11/9 PCCM consulted for vent management  MICRO:  Urine 11/2>>> enterococcus  Sputum 11/2>>normal flora  CDiff 10/20>>>neg  BCx2 10/17>>>neg    HISTORY OF PRESENT ILLNESS:   68 yo woman with chronic resp failure due to COPD, untreated OSA/OHS, medical non-compliance. Also with HTN, schizoaffective d/o, GERD. She has been admitted twice in last several weeks for acute respiratory failure, most recently to [facility] 10/9 from SNF where she was still on pred taper and receiving rehab. She was unresponsive, require intubation at [facility]. She was treated for AE-COPD, RLL HCAP and was extubated 10/12. CT chest 10/13 showed RLL infiltrate with small effusions. She transferred to [facility] 10/16 for further care. She refused BiPAP 10/16 hs and was hypercapneic am 10/17. BiPAp was placed but she did not improve. She had a resp arrest that resulted in < 10 minutes ACLS. She transfers to [facility] ICU for further care.  Discharged post tracheostomy to [facility] 11/5 and PCCM consulted for vent  management 11/9.   PAST MEDICAL HISTORY :   has a past medical history of COPD (chronic obstructive pulmonary disease); Obesity hypoventilation syndrome; OSA (obstructive sleep apnea); HTN (hypertension); Schizoaffective disorder; GERD (gastroesophageal reflux disease); Chronic respiratory failure with hypoxia; Hyperlipidemia; Renal angiolipoma; and Hepatic cyst.  has no past surgical history on file. Prior to Admission medications   Medication Sig Start Date End Date Taking? Authorizing Provider  albuterol (PROVENTIL) (2.5 MG/3ML) 0.083% nebulizer solution Take 3 mLs (2.5 mg total) by nebulization every 3 (three) hours as needed for wheezing or shortness of breath. 10/11/14   Marijean Heath, NP  Amino Acids-Protein Hydrolys (FEEDING SUPPLEMENT, PRO-STAT SUGAR FREE 64,) LIQD Place 30 mLs into feeding tube 2 (two) times daily at 10 AM and 5 PM. 10/11/14   Marijean Heath, NP  aspirin 81 MG tablet Take 81 mg by mouth daily.    Historical Provider, MD  atorvastatin (LIPITOR) 20 MG tablet Take 20 mg by mouth daily.    Historical Provider, MD  chlorhexidine (PERIDEX) 0.12 % solution 15 mLs by Mouth Rinse route 2 (two) times daily. 10/11/14   Marijean Heath, NP  enoxaparin (LOVENOX) 40 MG/0.4ML injection Inject 0.4 mLs (40 mg total) into the skin daily. 10/11/14   Marijean Heath, NP  feeding supplement, ENSURE COMPLETE, (ENSURE COMPLETE) LIQD Take 237 mLs by mouth 2 (two) times daily between meals. 10/07/14   Thurnell Lose, MD  fentaNYL (SUBLIMAZE) SOLN Inject 25-50 mcg into the vein every hour as needed. 10/11/14   Marijean Heath, NP  insulin aspart (NOVOLOG) 100 UNIT/ML injection Inject 0-15 Units into the skin every 4 (four) hours. 10/11/14   Marijean Heath, NP  ipratropium-albuterol (DUONEB) 0.5-2.5 (3) MG/3ML SOLN Take 3 mLs by nebulization every 6 (six) hours. 10/11/14   Marijean Heath, NP  LORazepam (ATIVAN) 2 MG/ML injection Inject 0.5 mLs (1 mg total) into the  vein every 4 (four) hours as needed for anxiety. 10/11/14   Marijean Heath, NP  Nutritional Supplements (FEEDING SUPPLEMENT, VITAL AF 1.2 CAL,) LIQD Place 1,000 mLs into feeding tube continuous. 10/11/14   Marijean Heath, NP  pantoprazole sodium (PROTONIX) 40 mg/20 mL PACK Place 20 mLs (40 mg total) into feeding tube daily. 10/11/14   Marijean Heath, NP  risperiDONE (RISPERDAL) 0.5 MG tablet Take 1 tablet (0.5 mg total) by mouth at bedtime. 10/11/14   Marijean Heath, NP  vancomycin (VANCOCIN) 1 GM/200ML SOLN Inject 200 mLs (1,000 mg total) into the vein every 12 (twelve) hours. 10/11/14   Marijean Heath, NP   Allergies  Allergen Reactions  . Klonopin [Clonazepam]   . Penicillins Other (See Comments)    FAMILY HISTORY:  has no family status information on file.  SOCIAL HISTORY:  reports that she has quit smoking. She does not have any smokeless tobacco history on file. She reports that she drinks alcohol. She reports that she does not use illicit drugs.  REVIEW OF SYSTEMS:  10 point review of system taken, please see HPI for positives and negatives.   SUBJECTIVE:   VITAL SIGNS: Vital signs reviewed. Abnormal values will appear under impression plan section.    INTAKE / OUTPUT: No intake or output data in the 24 hours ending 10/15/14 0923  PHYSICAL EXAMINATION: General: no distress, comfortable on vent Neuro: RASS 0, normal strength, moves extremities HEENT: trach site clean Cardiovascular: Regular, no murmur Lungs: no wheeze Abdomen: Soft, non tender, tf on hold for peg Musculoskeletal: no edema Skin: No rashes LABS:  CBC  Recent Labs Lab 10/11/14 0420 10/12/14 0600 10/15/14 0500 10/15/14 0754  WBC 5.2 6.0 4.6  --   HGB 7.2* 7.5* 6.5* 8.0*  HCT 24.8* 25.6* 22.0* 26.5*  PLT 214 229 201  --    Coag's  Recent Labs Lab 10/10/14 0420  INR 1.10   BMET  Recent Labs Lab 10/12/14 0600 10/13/14 0500 10/14/14 0738  NA 146 141 143   K 3.4* 3.3* 3.7  CL 103 98 102  CO2 32 33* 31  BUN 13 13 14   CREATININE 0.41* 0.46* 0.53  GLUCOSE 146* 124* 129*   Electrolytes  Recent Labs Lab 10/09/14 1600  10/12/14 0600 10/13/14 0500 10/14/14 0738  CALCIUM 9.2  < > 9.0 8.8 9.0  MG 1.6  --   --   --   --   < > = values in this interval not displayed. Sepsis Markers No results for input(s): LATICACIDVEN, PROCALCITON, O2SATVEN in the last 168 hours. ABG No results for input(s): PHART, PCO2ART, PO2ART in the last 168 hours. Liver Enzymes  Recent Labs Lab 10/12/14 0600  AST 10  ALT 10  ALKPHOS 84  BILITOT <0.2*  ALBUMIN 2.4*   Cardiac Enzymes No results for input(s): TROPONINI, PROBNP in the last 168 hours. Glucose  Recent Labs Lab 10/10/14 1613 10/10/14 1924 10/10/14 2359 10/11/14 0359 10/11/14 0807 10/11/14 1225  GLUCAP 84 119* 147* 127* 140* 177*    Imaging No results found.   ASSESSMENT / PLAN:   PULMONARY ETT 10/16 >> 10/26  ETT 11/02 >> 11/04 Lurline Idol Hyman Bible) 11/04 >>  A: Acute on chronic hypoxic/hypercapnic respiratory failure 2nd to HCAP, ATX, COPD, OSA, OHS, and non compliance. P:  Pressure support to trach collar wean as tolerated >> will need vent support at night F/u CXR intermittently Continue scheduled BDs  Richardson Landry Minor ACNP Maryanna Shape PCCM Pager [phone] till 3 pm If no answer page [phone] 10/15/2014, 9:28 AM  Will attempt to progress to trach collar during the day but continue vent support at night.  Continue bronchodilators.  Will continue to follow with you.  Patient seen and examined, agree with above note.  I dictated the care and orders written for this patient under my direction.  Rush Farmer, MD [phone]

## 2014-10-16 LAB — CBC
HCT: 27.3 % — ABNORMAL LOW (ref 36.0–46.0)
Hemoglobin: 8.2 g/dL — ABNORMAL LOW (ref 12.0–15.0)
MCH: 24.8 pg — ABNORMAL LOW (ref 26.0–34.0)
MCHC: 30 g/dL (ref 30.0–36.0)
MCV: 82.7 fL (ref 78.0–100.0)
Platelets: 322 10*3/uL (ref 150–400)
RBC: 3.3 MIL/uL — ABNORMAL LOW (ref 3.87–5.11)
RDW: 15.5 % (ref 11.5–15.5)
WBC: 8.4 10*3/uL (ref 4.0–10.5)

## 2014-10-16 LAB — BASIC METABOLIC PANEL
Anion gap: 11 (ref 5–15)
BUN: 16 mg/dL (ref 6–23)
CALCIUM: 9.3 mg/dL (ref 8.4–10.5)
CO2: 31 mEq/L (ref 19–32)
CREATININE: 0.51 mg/dL (ref 0.50–1.10)
Chloride: 98 mEq/L (ref 96–112)
GFR calc Af Amer: >90 mL/min (ref >90)
GLUCOSE: 131 mg/dL — AB (ref 70–99)
Potassium: 3.8 mEq/L (ref 3.7–5.3)
SODIUM: 140 meq/L (ref 137–147)

## 2014-10-18 ENCOUNTER — Other Ambulatory Visit (HOSPITAL_COMMUNITY): Payer: Self-pay

## 2014-10-18 DIAGNOSIS — J9621 Acute and chronic respiratory failure with hypoxia: Secondary | ICD-10-CM

## 2014-10-18 LAB — CBC
HCT: 27.2 % — ABNORMAL LOW (ref 36.0–46.0)
HEMOGLOBIN: 8.3 g/dL — AB (ref 12.0–15.0)
MCH: 24.8 pg — ABNORMAL LOW (ref 26.0–34.0)
MCHC: 30.5 g/dL (ref 30.0–36.0)
MCV: 81.2 fL (ref 78.0–100.0)
Platelets: 348 10*3/uL (ref 150–400)
RBC: 3.35 MIL/uL — AB (ref 3.87–5.11)
RDW: 15.2 % (ref 11.5–15.5)
WBC: 10.1 10*3/uL (ref 4.0–10.5)

## 2014-10-18 LAB — COMPREHENSIVE METABOLIC PANEL
ALT: 11 U/L (ref 0–35)
ANION GAP: 15 (ref 5–15)
AST: 12 U/L (ref 0–37)
Albumin: 2.6 g/dL — ABNORMAL LOW (ref 3.5–5.2)
Alkaline Phosphatase: 79 U/L (ref 39–117)
BUN: 16 mg/dL (ref 6–23)
CALCIUM: 9 mg/dL (ref 8.4–10.5)
CO2: 32 mEq/L (ref 19–32)
Chloride: 93 mEq/L — ABNORMAL LOW (ref 96–112)
Creatinine, Ser: 0.57 mg/dL (ref 0.50–1.10)
GFR calc Af Amer: >90 mL/min (ref >90)
GLUCOSE: 137 mg/dL — AB (ref 70–99)
Potassium: 2.9 mEq/L — CL (ref 3.7–5.3)
SODIUM: 140 meq/L (ref 137–147)
Total Bilirubin: <0.2 mg/dL — ABNORMAL LOW (ref 0.3–1.2)
Total Protein: 6.2 g/dL (ref 6.0–8.3)

## 2014-10-18 NOTE — Consult Note (Signed)
PULMONARY / CRITICAL CARE MEDICINE   Name: Debbie Pena MRN: [id number] DOB: Oct 03, 1946    ADMISSION DATE:  10/11/2014 CONSULTATION DATE: 11/9  REFERRING MD :  [facility]  CHIEF COMPLAINT:  Cardiac arrest  INITIAL PRESENTATION: 68 yo woman with chronic resp failure due to COPD, untreated OSA/OHS, medical non-compliance. Also with HTN, schizoaffective d/o, GERD. She has been admitted twice in last several weeks for acute respiratory failure, most recently to [facility] 10/9 from SNF where she was still on pred taper and receiving rehab. She was unresponsive, require intubation at [facility]. She was treated for AE-COPD, RLL HCAP and was extubated 10/12. CT chest 10/13 showed RLL infiltrate with small effusions. She transferred to [facility] 10/16 for further care. She refused BiPAP 10/16 hs and was hypercapneic am 10/17. BiPAp was placed but she did not improve. She had a resp arrest that resulted in < 10 minutes ACLS. She transfers to [facility] ICU for further care.  Discharged post tracheostomy to [facility] 11/5 and PCCM consulted for vent management 11/9.  TUBES/LINES:  ETT 10/16 >> 10/26  ETT 11/02 >> 11/04 Trach (JY) 11/04 >> L PICC 10/20>>> PEG planned for 11/9>>  STUDIES:  10/13 CT chest >> RLL infiltrate, B small effusions, L renal angiolipoma 10/18 CT head >> Chronic R BG lacunar infarct vs dilated prevascular space. No evidence CVA or global anoxia 10/29 Doppler legs >> no DVT 10/30 Echo >> mild LVH, EF 28%, mild RV systolic dysfx 78/67 CT chest >> no PE, b/l ATX with small b/l effusions  SIGNIFICANT EVENTS: 10/17 Transfer to [facility] 10/26 Extubated 10/29 Transfer out of ICU 11/01 Altered mental status, respiratory >> was off CPAP/oxygen 11/02 Off pressors 11/04 Tracheostomy by JY 11/5 tx to ssh. 11/9 PCCM consulted for vent management  MICRO:  Urine 11/2>>> enterococcus  Sputum 11/2>>normal flora  CDiff 10/20>>>neg  BCx2 10/17>>>neg   SUBJECTIVE: No events overnight.  VITAL  SIGNS: Vital signs reviewed. Abnormal values will appear under impression plan section.   INTAKE / OUTPUT: No intake or output data in the 24 hours ending 10/18/14 1117  PHYSICAL EXAMINATION: General: no distress, comfortable on vent Neuro: RASS 0, normal strength, moves extremities HEENT: trach site clean Cardiovascular: Regular, no murmur Lungs: no wheeze Abdomen: Soft, non tender, tf on hold for peg Musculoskeletal: no edema Skin: No rashes LABS:  CBC  Recent Labs Lab 10/15/14 0500 10/15/14 0754 10/16/14 0855 10/18/14 0706  WBC 4.6  --  8.4 10.1  HGB 6.5* 8.0* 8.2* 8.3*  HCT 22.0* 26.5* 27.3* 27.2*  PLT 201  --  322 348   Coag's No results for input(s): APTT, INR in the last 168 hours. BMET  Recent Labs Lab 10/14/14 0738 10/16/14 0855 10/18/14 0706  NA 143 140 140  K 3.7 3.8 2.9*  CL 102 98 93*  CO2 31 31 32  BUN 14 16 16   CREATININE 0.53 0.51 0.57  GLUCOSE 129* 131* 137*   Electrolytes  Recent Labs Lab 10/14/14 0738 10/16/14 0855 10/18/14 0706  CALCIUM 9.0 9.3 9.0   Sepsis Markers No results for input(s): LATICACIDVEN, PROCALCITON, O2SATVEN in the last 168 hours. ABG No results for input(s): PHART, PCO2ART, PO2ART in the last 168 hours. Liver Enzymes  Recent Labs Lab 10/12/14 0600 10/18/14 0706  AST 10 12  ALT 10 11  ALKPHOS 84 79  BILITOT <0.2* <0.2*  ALBUMIN 2.4* 2.6*   Cardiac Enzymes No results for input(s): TROPONINI, PROBNP in the last 168 hours. Glucose  Recent Labs Lab  10/11/14 1225  GLUCAP 177*    Imaging No results found.   ASSESSMENT / PLAN:   PULMONARY ETT 10/16 >> 10/26  ETT 11/02 >> 11/04 Trach Hyman Bible) 11/04 >>  A: Acute on chronic hypoxic/hypercapnic respiratory failure 2nd to HCAP, ATX, COPD, OSA, OHS, and non compliance. P:  Pressure support to trach collar wean as tolerated >> continue vent support at night F/u CXR intermittently Continue scheduled BDs  Rush Farmer, M.D. [facility]  Pulmonary/Critical Care Medicine. Pager: [phone]. After hours pager: [phone].

## 2014-10-19 ENCOUNTER — Other Ambulatory Visit (HOSPITAL_COMMUNITY): Payer: Self-pay

## 2014-10-19 LAB — POTASSIUM: Potassium: 3.3 mEq/L — ABNORMAL LOW (ref 3.7–5.3)

## 2014-10-20 LAB — CBC
HEMATOCRIT: 26.4 % — AB (ref 36.0–46.0)
HEMOGLOBIN: 8.1 g/dL — AB (ref 12.0–15.0)
MCH: 24.6 pg — ABNORMAL LOW (ref 26.0–34.0)
MCHC: 30.7 g/dL (ref 30.0–36.0)
MCV: 80.2 fL (ref 78.0–100.0)
Platelets: 285 10*3/uL (ref 150–400)
RBC: 3.29 MIL/uL — ABNORMAL LOW (ref 3.87–5.11)
RDW: 15.3 % (ref 11.5–15.5)
WBC: 11.8 10*3/uL — AB (ref 4.0–10.5)

## 2014-10-20 LAB — BASIC METABOLIC PANEL
Anion gap: 15 (ref 5–15)
BUN: 17 mg/dL (ref 6–23)
CO2: 29 mEq/L (ref 19–32)
Calcium: 9.2 mg/dL (ref 8.4–10.5)
Chloride: 95 mEq/L — ABNORMAL LOW (ref 96–112)
Creatinine, Ser: 0.62 mg/dL (ref 0.50–1.10)
GFR calc non Af Amer: 89 mL/min — ABNORMAL LOW (ref >90)
GLUCOSE: 138 mg/dL — AB (ref 70–99)
POTASSIUM: 3 meq/L — AB (ref 3.7–5.3)
Sodium: 139 mEq/L (ref 137–147)

## 2014-10-22 ENCOUNTER — Other Ambulatory Visit (HOSPITAL_COMMUNITY): Payer: Self-pay

## 2014-10-22 DIAGNOSIS — R451 Restlessness and agitation: Secondary | ICD-10-CM

## 2014-10-22 DIAGNOSIS — R40244 Other coma, without documented Glasgow coma scale score, or with partial score reported: Secondary | ICD-10-CM

## 2014-10-22 LAB — BASIC METABOLIC PANEL
ANION GAP: 12 (ref 5–15)
BUN: 28 mg/dL — ABNORMAL HIGH (ref 6–23)
CO2: 30 meq/L (ref 19–32)
Calcium: 9.6 mg/dL (ref 8.4–10.5)
Chloride: 98 mEq/L (ref 96–112)
Creatinine, Ser: 0.51 mg/dL (ref 0.50–1.10)
GFR calc Af Amer: >90 mL/min (ref >90)
GFR calc non Af Amer: >90 mL/min (ref >90)
Glucose, Bld: 159 mg/dL — ABNORMAL HIGH (ref 70–99)
Potassium: 3.6 mEq/L — ABNORMAL LOW (ref 3.7–5.3)
SODIUM: 140 meq/L (ref 137–147)

## 2014-10-22 LAB — CBC
HCT: 29 % — ABNORMAL LOW (ref 36.0–46.0)
Hemoglobin: 8.6 g/dL — ABNORMAL LOW (ref 12.0–15.0)
MCH: 24.4 pg — ABNORMAL LOW (ref 26.0–34.0)
MCHC: 29.7 g/dL — ABNORMAL LOW (ref 30.0–36.0)
MCV: 82.4 fL (ref 78.0–100.0)
PLATELETS: 321 10*3/uL (ref 150–400)
RBC: 3.52 MIL/uL — AB (ref 3.87–5.11)
RDW: 15.2 % (ref 11.5–15.5)
WBC: 11 10*3/uL — AB (ref 4.0–10.5)

## 2014-10-22 MED ORDER — IOHEXOL 300 MG/ML  SOLN
100.0000 mL | Freq: Once | INTRAMUSCULAR | Status: AC | PRN
  Administered 2014-10-22: 100 mL via INTRAVENOUS

## 2014-10-22 NOTE — Progress Notes (Signed)
PULMONARY / CRITICAL CARE MEDICINE   Name: Debbie Pena MRN: [id number] DOB: 09/22/1946    ADMISSION DATE:  10/11/2014 CONSULTATION DATE: 11/9  REFERRING MD :  [facility]  CHIEF COMPLAINT:  Cardiac arrest  INITIAL PRESENTATION: 68 yo woman with chronic resp failure due to COPD, untreated OSA/OHS, medical non-compliance. Also with HTN, schizoaffective d/o, GERD. She has been admitted twice in last several weeks for acute respiratory failure, most recently to [facility] 10/9 from SNF where she was still on pred taper and receiving rehab. She was unresponsive, require intubation at [facility]. She was treated for AE-COPD, RLL HCAP and was extubated 10/12. CT chest 10/13 showed RLL infiltrate with small effusions. She transferred to [facility] 10/16 for further care. She refused BiPAP 10/16 hs and was hypercapneic am 10/17. BiPAp was placed but she did not improve. She had a resp arrest that resulted in < 10 minutes ACLS. She transfers to [facility] ICU for further care.  Discharged post tracheostomy to [facility] 11/5 and PCCM consulted for vent management 11/9.  TUBES/LINES:  ETT 10/16 >> 10/26  ETT 11/02 >> 11/04 Trach (JY) 11/04 >> L PICC 10/20>>> PEG 11/9>>  STUDIES:  10/13 CT chest >> RLL infiltrate, B small effusions, L renal angiolipoma 10/18 CT head >> Chronic R BG lacunar infarct vs dilated prevascular space. No evidence CVA or global anoxia 10/29 Doppler legs >> no DVT 10/30 Echo >> mild LVH, EF 75%, mild RV systolic dysfx 64/33 CT chest >> no PE, b/l ATX with small b/l effusions  SIGNIFICANT EVENTS: 10/17 Transfer to [facility] 10/26 Extubated 10/29 Transfer out of ICU 11/01 Altered mental status, respiratory >> was off CPAP/oxygen 11/02 Off pressors 11/04 Tracheostomy by JY 11/5 tx to ssh. 11/9 PCCM consulted for vent management  MICRO:  Urine 11/2>>> enterococcus  Sputum 11/2>>normal flora  CDiff 10/20>>>neg  BCx2 10/17>>>neg   SUBJECTIVE: No events overnight.  VITAL  SIGNS: Vital signs reviewed. Abnormal values will appear under impression plan section.   INTAKE / OUTPUT: No intake or output data in the 24 hours ending 10/22/14 0934  PHYSICAL EXAMINATION: General: no distress, comfortable on vent, failed wean this am Neuro: RASS 0, normal strength, moves extremities HEENT: trach site clean Cardiovascular: Regular, no murmur Lungs: no wheeze Abdomen: Soft, non tender, tf via peg Musculoskeletal: no edema Skin: No rashes LABS:  CBC  Recent Labs Lab 10/16/14 0855 10/18/14 0706 10/20/14 0500  WBC 8.4 10.1 11.8*  HGB 8.2* 8.3* 8.1*  HCT 27.3* 27.2* 26.4*  PLT 322 348 285   Coag's No results for input(s): APTT, INR in the last 168 hours. BMET  Recent Labs Lab 10/16/14 0855 10/18/14 0706 10/19/14 0500 10/20/14 0500  NA 140 140  --  139  K 3.8 2.9* 3.3* 3.0*  CL 98 93*  --  95*  CO2 31 32  --  29  BUN 16 16  --  17  CREATININE 0.51 0.57  --  0.62  GLUCOSE 131* 137*  --  138*   Electrolytes  Recent Labs Lab 10/16/14 0855 10/18/14 0706 10/20/14 0500  CALCIUM 9.3 9.0 9.2   Sepsis Markers No results for input(s): LATICACIDVEN, PROCALCITON, O2SATVEN in the last 168 hours. ABG No results for input(s): PHART, PCO2ART, PO2ART in the last 168 hours. Liver Enzymes  Recent Labs Lab 10/18/14 0706  AST 12  ALT 11  ALKPHOS 79  BILITOT <0.2*  ALBUMIN 2.6*   Cardiac Enzymes No results for input(s): TROPONINI, PROBNP in the last 168 hours. Glucose  No results for input(s): GLUCAP in the last 168 hours.  Imaging No results found.   ASSESSMENT / PLAN:   PULMONARY ETT 10/16 >> 10/26  ETT 11/02 >> 11/04 Trach Hyman Bible) 11/04 >>  A: Acute on chronic hypoxic/hypercapnic respiratory failure 2nd to HCAP, ATX, COPD, OSA, OHS, and non compliance. P:  Pressure support to trach collar wean as tolerated >> continue vent support at night. Currently apneic on wean trials F/u CXR intermittently Continue scheduled BDs  [facility]  Minor ACNP Maryanna Shape PCCM Pager [phone] till 3 pm If no answer page [phone] 10/22/2014, 9:34 AM  Issue remains is the apneic episodes during weaning, would recommend minimizing sedation (agree that psych disorders here are resulting in agitation that is requiring sedating medications), f/u CXR, continue bronchodilators as ordered.  Abx for pneumonia noted.  Goal is 12/5 for 4 hours today if able to.  Apnea episodes will be the primary concern so recommend close monitoring.  Patient seen and examined, agree with above note.  I dictated the care and orders written for this patient under my direction.  Rush Farmer, MD [phone]

## 2014-10-24 LAB — BASIC METABOLIC PANEL
ANION GAP: 13 (ref 5–15)
BUN: 25 mg/dL — ABNORMAL HIGH (ref 6–23)
CHLORIDE: 97 meq/L (ref 96–112)
CO2: 31 mEq/L (ref 19–32)
Calcium: 9.8 mg/dL (ref 8.4–10.5)
Creatinine, Ser: 0.53 mg/dL (ref 0.50–1.10)
GFR calc Af Amer: >90 mL/min (ref >90)
GFR calc non Af Amer: >90 mL/min (ref >90)
Glucose, Bld: 110 mg/dL — ABNORMAL HIGH (ref 70–99)
Potassium: 2.9 mEq/L — CL (ref 3.7–5.3)
SODIUM: 141 meq/L (ref 137–147)

## 2014-10-24 LAB — CBC
HCT: 27.5 % — ABNORMAL LOW (ref 36.0–46.0)
HEMOGLOBIN: 8.3 g/dL — AB (ref 12.0–15.0)
MCH: 24.5 pg — ABNORMAL LOW (ref 26.0–34.0)
MCHC: 30.2 g/dL (ref 30.0–36.0)
MCV: 81.1 fL (ref 78.0–100.0)
Platelets: 250 10*3/uL (ref 150–400)
RBC: 3.39 MIL/uL — AB (ref 3.87–5.11)
RDW: 15.5 % (ref 11.5–15.5)
WBC: 10.5 10*3/uL (ref 4.0–10.5)

## 2014-10-24 LAB — POTASSIUM: Potassium: 5.9 mEq/L — ABNORMAL HIGH (ref 3.7–5.3)

## 2014-10-25 ENCOUNTER — Other Ambulatory Visit (HOSPITAL_COMMUNITY): Payer: Self-pay

## 2014-10-25 DIAGNOSIS — J449 Chronic obstructive pulmonary disease, unspecified: Secondary | ICD-10-CM

## 2014-10-25 DIAGNOSIS — J440 Chronic obstructive pulmonary disease with acute lower respiratory infection: Secondary | ICD-10-CM

## 2014-10-25 DIAGNOSIS — J9620 Acute and chronic respiratory failure, unspecified whether with hypoxia or hypercapnia: Secondary | ICD-10-CM

## 2014-10-25 DIAGNOSIS — G4733 Obstructive sleep apnea (adult) (pediatric): Secondary | ICD-10-CM

## 2014-10-25 DIAGNOSIS — Z930 Tracheostomy status: Secondary | ICD-10-CM

## 2014-10-25 DIAGNOSIS — E662 Morbid (severe) obesity with alveolar hypoventilation: Secondary | ICD-10-CM

## 2014-10-25 LAB — POTASSIUM: POTASSIUM: 4.1 meq/L (ref 3.7–5.3)

## 2014-10-25 NOTE — Progress Notes (Signed)
PULMONARY / CRITICAL CARE MEDICINE   Name: Debbie Pena MRN: [id number] DOB: 1946-03-27    ADMISSION DATE:  10/11/2014 CONSULTATION DATE: 11/9  REFERRING MD :  [facility]  CHIEF COMPLAINT:  Cardiac arrest  INITIAL PRESENTATION: 68 yo woman with chronic resp failure due to COPD, untreated OSA/OHS, medical non-compliance. Also with HTN, schizoaffective d/o, GERD. She has been admitted twice in last several weeks for acute respiratory failure, most recently to [facility] 10/9 from SNF where she was still on pred taper and receiving rehab. She was unresponsive, require intubation at [facility]. She was treated for AE-COPD, RLL HCAP and was extubated 10/12. CT chest 10/13 showed RLL infiltrate with small effusions. She transferred to [facility] 10/16 for further care. She refused BiPAP 10/16 hs and was hypercapneic am 10/17. BiPAp was placed but she did not improve. She had a resp arrest that resulted in < 10 minutes ACLS. She transfers to [facility] ICU for further care.  Discharged post tracheostomy to [facility] 11/5 and PCCM consulted for vent management 11/9.  TUBES/LINES:  ETT 10/16 >> 10/26  ETT 11/02 >> 11/04 Trach (JY) 11/04 >> L PICC 10/20>>> PEG 11/9>>  STUDIES:  10/13 CT chest >> RLL infiltrate, B small effusions, L renal angiolipoma 10/18 CT head >> Chronic R BG lacunar infarct vs dilated prevascular space. No evidence CVA or global anoxia 10/29 Doppler legs >> no DVT 10/30 Echo >> mild LVH, EF 45%, mild RV systolic dysfx 80/99 CT chest >> no PE, b/l ATX with small b/l effusions  SIGNIFICANT EVENTS: 10/17 Transfer to [facility] 10/26 Extubated 10/29 Transfer out of ICU 11/01 Altered mental status, respiratory >> was off CPAP/oxygen 11/02 Off pressors 11/04 Tracheostomy by JY 11/5 tx to ssh. 11/9 PCCM consulted for vent management  MICRO:  Urine 11/2>>> enterococcus  Sputum 11/2>>normal flora  CDiff 10/20>>>neg  BCx2 10/17>>>neg   SUBJECTIVE: No events overnight.  VITAL  SIGNS: Vital signs reviewed. Abnormal values will appear under impression plan section.   INTAKE / OUTPUT: No intake or output data in the 24 hours ending 10/25/14 0948  PHYSICAL EXAMINATION: General: no distress, comfortable on t collar Neuro: RASS 1, normal strength, moves extremities HEENT: trach site clean Cardiovascular: Regular, no murmur Lungs: no wheeze Abdomen: Soft, non tender, tf via peg Musculoskeletal: no edema Skin: No rashes LABS:  CBC  Recent Labs Lab 10/20/14 0500 10/22/14 1143 10/24/14 0500  WBC 11.8* 11.0* 10.5  HGB 8.1* 8.6* 8.3*  HCT 26.4* 29.0* 27.5*  PLT 285 321 250   Coag's No results for input(s): APTT, INR in the last 168 hours. BMET  Recent Labs Lab 10/20/14 0500 10/22/14 1143 10/24/14 0500 10/24/14 1600  NA 139 140 141  --   K 3.0* 3.6* 2.9* 5.9*  CL 95* 98 97  --   CO2 29 30 31   --   BUN 17 28* 25*  --   CREATININE 0.62 0.51 0.53  --   GLUCOSE 138* 159* 110*  --    Electrolytes  Recent Labs Lab 10/20/14 0500 10/22/14 1143 10/24/14 0500  CALCIUM 9.2 9.6 9.8   Sepsis Markers No results for input(s): LATICACIDVEN, PROCALCITON, O2SATVEN in the last 168 hours. ABG No results for input(s): PHART, PCO2ART, PO2ART in the last 168 hours. Liver Enzymes No results for input(s): AST, ALT, ALKPHOS, BILITOT, ALBUMIN in the last 168 hours. Cardiac Enzymes No results for input(s): TROPONINI, PROBNP in the last 168 hours. Glucose No results for input(s): GLUCAP in the last 168 hours.  Imaging  No results found.   ASSESSMENT / PLAN:   PULMONARY ETT 10/16 >> 10/26  ETT 11/02 >> 11/04 Trach Hyman Bible) 11/04 >>  A: Acute on chronic hypoxic/hypercapnic respiratory failure 2nd to HCAP, ATX, COPD, OSA, OHS, and non compliance. P:  Pressure support to trach collar wean as tolerated >> continue vent support at night. Currently weaning F/u CXR intermittently Continue scheduled BDs  Richardson Landry Philis Doke ACNP Maryanna Shape PCCM Pager [phone]  till 3 pm If no answer page [phone] 10/25/2014, 9:48 AM

## 2014-10-26 LAB — BASIC METABOLIC PANEL
ANION GAP: 13 (ref 5–15)
BUN: 22 mg/dL (ref 6–23)
CALCIUM: 9.9 mg/dL (ref 8.4–10.5)
CHLORIDE: 98 meq/L (ref 96–112)
CO2: 33 meq/L — AB (ref 19–32)
Creatinine, Ser: 0.53 mg/dL (ref 0.50–1.10)
GFR calc Af Amer: >90 mL/min (ref >90)
GFR calc non Af Amer: >90 mL/min (ref >90)
GLUCOSE: 122 mg/dL — AB (ref 70–99)
POTASSIUM: 3.4 meq/L — AB (ref 3.7–5.3)
SODIUM: 144 meq/L (ref 137–147)

## 2014-10-27 LAB — URINALYSIS, ROUTINE W REFLEX MICROSCOPIC
BILIRUBIN URINE: NEGATIVE
Glucose, UA: NEGATIVE mg/dL
Hgb urine dipstick: NEGATIVE
Ketones, ur: NEGATIVE mg/dL
Nitrite: NEGATIVE
Protein, ur: 30 mg/dL — AB
Specific Gravity, Urine: 1.024 (ref 1.005–1.030)
UROBILINOGEN UA: 0.2 mg/dL (ref 0.0–1.0)
pH: 5.5 (ref 5.0–8.0)

## 2014-10-27 LAB — URINE MICROSCOPIC-ADD ON

## 2014-10-27 LAB — POTASSIUM: POTASSIUM: 3.4 meq/L — AB (ref 3.7–5.3)

## 2014-10-28 LAB — BASIC METABOLIC PANEL
Anion gap: 15 (ref 5–15)
BUN: 22 mg/dL (ref 6–23)
CHLORIDE: 95 meq/L — AB (ref 96–112)
CO2: 34 mEq/L — ABNORMAL HIGH (ref 19–32)
CREATININE: 0.53 mg/dL (ref 0.50–1.10)
Calcium: 9.9 mg/dL (ref 8.4–10.5)
GFR calc Af Amer: >90 mL/min (ref >90)
GFR calc non Af Amer: >90 mL/min (ref >90)
Glucose, Bld: 117 mg/dL — ABNORMAL HIGH (ref 70–99)
Potassium: 3.7 mEq/L (ref 3.7–5.3)
Sodium: 144 mEq/L (ref 137–147)

## 2014-10-28 LAB — URINE CULTURE

## 2014-10-28 LAB — CBC
HEMATOCRIT: 30.7 % — AB (ref 36.0–46.0)
HEMOGLOBIN: 8.8 g/dL — AB (ref 12.0–15.0)
MCH: 23.4 pg — ABNORMAL LOW (ref 26.0–34.0)
MCHC: 28.7 g/dL — ABNORMAL LOW (ref 30.0–36.0)
MCV: 81.6 fL (ref 78.0–100.0)
Platelets: 229 10*3/uL (ref 150–400)
RBC: 3.76 MIL/uL — ABNORMAL LOW (ref 3.87–5.11)
RDW: 15.1 % (ref 11.5–15.5)
WBC: 8.2 10*3/uL (ref 4.0–10.5)

## 2014-10-29 DIAGNOSIS — L0291 Cutaneous abscess, unspecified: Secondary | ICD-10-CM | POA: Insufficient documentation

## 2014-10-29 DIAGNOSIS — J418 Mixed simple and mucopurulent chronic bronchitis: Secondary | ICD-10-CM

## 2014-10-29 DIAGNOSIS — R1011 Right upper quadrant pain: Secondary | ICD-10-CM

## 2014-10-29 NOTE — Progress Notes (Signed)
PULMONARY / CRITICAL CARE MEDICINE   Name: Debbie Pena MRN: [id number] DOB: 1946-05-24    ADMISSION DATE:  10/11/2014 CONSULTATION DATE: 11/9  REFERRING MD :  [facility]  CHIEF COMPLAINT:  Cardiac arrest  INITIAL PRESENTATION: 68 yo woman with chronic resp failure due to COPD, untreated OSA/OHS, medical non-compliance. Also with HTN, schizoaffective d/o, GERD. She has been admitted twice in last several weeks for acute respiratory failure, most recently to [facility] 10/9 from SNF where she was still on pred taper and receiving rehab. She was unresponsive, require intubation at [facility]. She was treated for AE-COPD, RLL HCAP and was extubated 10/12. CT chest 10/13 showed RLL infiltrate with small effusions. She transferred to [facility] 10/16 for further care. She refused BiPAP 10/16 hs and was hypercapneic am 10/17. BiPAp was placed but she did not improve. She had a resp arrest that resulted in < 10 minutes ACLS. She transfers to [facility] ICU for further care.  Discharged post tracheostomy to [facility] 11/5 and PCCM consulted for vent management 11/9.  TUBES/LINES:  ETT 10/16 >> 10/26  ETT 11/02 >> 11/04 Trach (JY) 11/04 >> L PICC 10/20>>> PEG 11/9>>  STUDIES:  10/13 CT chest >> RLL infiltrate, B small effusions, L renal angiolipoma 10/18 CT head >> Chronic R BG lacunar infarct vs dilated prevascular space. No evidence CVA or global anoxia 10/29 Doppler legs >> no DVT 10/30 Echo >> mild LVH, EF 34%, mild RV systolic dysfx 91/79 CT chest >> no PE, b/l ATX with small b/l effusions  SIGNIFICANT EVENTS: 10/17 Transfer to [facility] 10/26 Extubated 10/29 Transfer out of ICU 11/01 Altered mental status, respiratory >> was off CPAP/oxygen 11/02 Off pressors 11/04 Tracheostomy by JY 11/5 tx to ssh. 11/9 PCCM consulted for vent management  MICRO:  Urine 11/2>>> enterococcus  Sputum 11/2>>normal flora  CDiff 10/20>>>neg  BCx2 10/17>>>neg   SUBJECTIVE: No events overnight.  VITAL  SIGNS: Vital signs reviewed. Abnormal values will appear under impression plan section.   INTAKE / OUTPUT: No intake or output data in the 24 hours ending 10/29/14 1031  PHYSICAL EXAMINATION: General: no distress, comfortable on t collar Neuro: RASS 1, normal strength, moves extremities HEENT: trach site clean Cardiovascular: Regular, no murmur Lungs: no wheeze Abdomen: Soft, non tender, tf via peg Musculoskeletal: no edema Skin: No rashes LABS:  CBC  Recent Labs Lab 10/22/14 1143 10/24/14 0500 10/28/14 0530  WBC 11.0* 10.5 8.2  HGB 8.6* 8.3* 8.8*  HCT 29.0* 27.5* 30.7*  PLT 321 250 229   Coag's No results for input(s): APTT, INR in the last 168 hours. BMET  Recent Labs Lab 10/24/14 0500  10/26/14 0630 10/27/14 0550 10/28/14 0530  NA 141  --  144  --  144  K 2.9*  < > 3.4* 3.4* 3.7  CL 97  --  98  --  95*  CO2 31  --  33*  --  34*  BUN 25*  --  22  --  22  CREATININE 0.53  --  0.53  --  0.53  GLUCOSE 110*  --  122*  --  117*  < > = values in this interval not displayed. Electrolytes  Recent Labs Lab 10/24/14 0500 10/26/14 0630 10/28/14 0530  CALCIUM 9.8 9.9 9.9    Imaging No results found.   ASSESSMENT / PLAN:   PULMONARY ETT 10/16 >> 10/26  ETT 11/02 >> 11/04 Lurline Idol Hyman Bible) 11/04 >>  Assessment  Acute on chronic hypoxic/hypercapnic respiratory failure 2nd to HCAP, ATX,  COPD, OSA, OHS, and non compliance.   Discussion Off vent. Looks comfortable on ATC. Has some trouble with the cuffed trach and attempts at [facility].   Plan:  Cont ATC and routine trach care pmv attempts with cuff down Convert to cuffless # 6 after evaluation abg at 5 am  F/u CXR intermittently Continue scheduled BDs   10/29/2014, 10:31 AM  STAFF NOTE: I, Merrie Roof, MD FACP have personally reviewed patient's available data, including medical history, events of note, physical examination and test results as part of my evaluation. I have discussed with  resident/NP and other care providers such as pharmacist, RN and RRT. In addition, I personally evaluated patient and elicited key findings of: reluctant to downsize without cuff until abg at 5 am to determine if vent nocturnal needed.  Lavon Paganini. Titus Mould, MD, [place] Pgr: [place] [facility] 10/29/2014 12:28 PM

## 2014-10-30 LAB — BLOOD GAS, ARTERIAL
Acid-Base Excess: 11.9 mmol/L — ABNORMAL HIGH (ref 0.0–2.0)
Bicarbonate: 36.9 mEq/L — ABNORMAL HIGH (ref 20.0–24.0)
FIO2: 0.28 %
O2 Saturation: 98.5 %
PATIENT TEMPERATURE: 98.6
PH ART: 7.423 (ref 7.350–7.450)
TCO2: 38.7 mmol/L (ref 0–100)
pCO2 arterial: 57.6 mmHg (ref 35.0–45.0)
pO2, Arterial: 111 mmHg — ABNORMAL HIGH (ref 80.0–100.0)

## 2014-11-02 LAB — CBC
HCT: 27.9 % — ABNORMAL LOW (ref 36.0–46.0)
HEMOGLOBIN: 7.8 g/dL — AB (ref 12.0–15.0)
MCH: 23.8 pg — AB (ref 26.0–34.0)
MCHC: 28 g/dL — AB (ref 30.0–36.0)
MCV: 85.1 fL (ref 78.0–100.0)
PLATELETS: 152 10*3/uL (ref 150–400)
RBC: 3.28 MIL/uL — ABNORMAL LOW (ref 3.87–5.11)
RDW: 15.4 % (ref 11.5–15.5)
WBC: 5.1 10*3/uL (ref 4.0–10.5)

## 2014-11-02 LAB — BASIC METABOLIC PANEL
Anion gap: 13 (ref 5–15)
BUN: 21 mg/dL (ref 6–23)
CALCIUM: 10.2 mg/dL (ref 8.4–10.5)
CO2: 36 mEq/L — ABNORMAL HIGH (ref 19–32)
Chloride: 90 mEq/L — ABNORMAL LOW (ref 96–112)
Creatinine, Ser: 0.55 mg/dL (ref 0.50–1.10)
Glucose, Bld: 184 mg/dL — ABNORMAL HIGH (ref 70–99)
Potassium: 4.5 mEq/L (ref 3.7–5.3)
SODIUM: 139 meq/L (ref 137–147)

## 2014-11-02 NOTE — Progress Notes (Signed)
PULMONARY / CRITICAL CARE MEDICINE   Name: Debbie Pena MRN: [id number] DOB: [birth year]    ADMISSION DATE:  10/11/2014 CONSULTATION DATE: 11/9  REFERRING MD :  [facility]  CHIEF COMPLAINT:  Cardiac arrest  INITIAL PRESENTATION: 68 yo woman with chronic resp failure due to COPD, untreated OSA/OHS, medical non-compliance. Also with HTN, schizoaffective d/o, GERD. She has been admitted twice in last several weeks for acute respiratory failure, most recently to [facility] 10/9 from SNF where she was still on pred taper and receiving rehab. She was unresponsive, require intubation at [facility]. She was treated for AE-COPD, RLL HCAP and was extubated 10/12. CT chest 10/13 showed RLL infiltrate with small effusions. She transferred to [facility] 10/16 for further care. She refused BiPAP 10/16 hs and was hypercapneic am 10/17. BiPAp was placed but she did not improve. She had a resp arrest that resulted in < 10 minutes ACLS. She transfers to [facility] ICU for further care.  Discharged post tracheostomy to [facility] 11/5 and PCCM consulted for vent management 11/9.  TUBES/LINES:  ETT 10/16 >> 10/26  ETT 11/02 >> 11/04 Trach (JY) 11/04 >> L PICC 10/20>>> PEG 11/9>>  STUDIES:  10/13 CT chest >> RLL infiltrate, B small effusions, L renal angiolipoma 10/18 CT head >> Chronic R BG lacunar infarct vs dilated prevascular space. No evidence CVA or global anoxia 10/29 Doppler legs >> no DVT 10/30 Echo >> mild LVH, EF 53%, mild RV systolic dysfx 97/67 CT chest >> no PE, b/l ATX with small b/l effusions  SIGNIFICANT EVENTS: 10/17 Transfer to [facility] 10/26 Extubated 10/29 Transfer out of ICU 11/01 Altered mental status, respiratory >> was off CPAP/oxygen 11/02 Off pressors 11/04 Tracheostomy by JY 11/5 tx to ssh. 11/9 PCCM consulted for vent management  MICRO:  Urine 11/2>>> enterococcus  Sputum 11/2>>normal flora  CDiff 10/20>>>neg  BCx2 10/17>>>neg   SUBJECTIVE: No events overnight.  VITAL  SIGNS: Vital signs reviewed. Abnormal values will appear under impression plan section.   INTAKE / OUTPUT: No intake or output data in the 24 hours ending 11/02/14 0914  PHYSICAL EXAMINATION: General: no distress, comfortable on ATC Neuro: RASS 1, normal strength, moves extremities HEENT: trach site clean Cardiovascular: Regular, no murmur Lungs: no wheeze Abdomen: Soft, non tender, tf via peg Musculoskeletal: no edema Skin: No rashes LABS:  CBC  Recent Labs Lab 10/28/14 0530 11/02/14 0500  WBC 8.2 5.1  HGB 8.8* 7.8*  HCT 30.7* 27.9*  PLT 229 152   Coag's No results for input(s): APTT, INR in the last 168 hours. BMET  Recent Labs Lab 10/27/14 0550 10/28/14 0530  NA  --  144  K 3.4* 3.7  CL  --  95*  CO2  --  34*  BUN  --  22  CREATININE  --  0.53  GLUCOSE  --  117*   Electrolytes  Recent Labs Lab 10/28/14 0530  CALCIUM 9.9    Imaging No results found.   ASSESSMENT / PLAN:   PULMONARY ETT 10/16 >> 10/26  ETT 11/02 >> 11/04 Trach Hyman Bible) 11/04 >>  Assessment  Acute on chronic hypoxic/hypercapnic respiratory failure 2nd to HCAP, ATX, COPD, OSA, OHS.  Plan:  Cont ATC and routine trach care pmv attempts with cuff down F/u CXR intermittently Continue scheduled BDs   Mariel Sleet [facility] 11/02/2014 9:14 AM

## 2014-11-05 ENCOUNTER — Other Ambulatory Visit (HOSPITAL_COMMUNITY): Payer: Self-pay

## 2014-11-05 DIAGNOSIS — J432 Centrilobular emphysema: Secondary | ICD-10-CM

## 2014-11-05 LAB — CBC WITH DIFFERENTIAL/PLATELET
BASOS ABS: 0 10*3/uL (ref 0.0–0.1)
Basophils Relative: 0 % (ref 0–1)
EOS PCT: 2 % (ref 0–5)
Eosinophils Absolute: 0.2 10*3/uL (ref 0.0–0.7)
HCT: 34.5 % — ABNORMAL LOW (ref 36.0–46.0)
Hemoglobin: 10.3 g/dL — ABNORMAL LOW (ref 12.0–15.0)
LYMPHS PCT: 18 % (ref 12–46)
Lymphs Abs: 1.4 10*3/uL (ref 0.7–4.0)
MCH: 23.7 pg — ABNORMAL LOW (ref 26.0–34.0)
MCHC: 29.9 g/dL — ABNORMAL LOW (ref 30.0–36.0)
MCV: 79.5 fL (ref 78.0–100.0)
Monocytes Absolute: 0.6 10*3/uL (ref 0.1–1.0)
Monocytes Relative: 8 % (ref 3–12)
Neutro Abs: 5.4 10*3/uL (ref 1.7–7.7)
Neutrophils Relative %: 72 % (ref 43–77)
PLATELETS: 218 10*3/uL (ref 150–400)
RBC: 4.34 MIL/uL (ref 3.87–5.11)
RDW: 15 % (ref 11.5–15.5)
WBC: 7.6 10*3/uL (ref 4.0–10.5)

## 2014-11-05 NOTE — Progress Notes (Signed)
PULMONARY / CRITICAL CARE MEDICINE   Name: SUNI JARNAGIN MRN: [id number] DOB: 12-09-1945    ADMISSION DATE:  10/11/2014 CONSULTATION DATE: 11/9  REFERRING MD :  [facility]  CHIEF COMPLAINT:  Cardiac arrest  INITIAL PRESENTATION: 68 yo woman with chronic resp failure due to COPD, untreated OSA/OHS, medical non-compliance. Also with HTN, schizoaffective d/o, GERD. She has been admitted twice in last several weeks for acute respiratory failure, most recently to [facility] 10/9 from SNF where she was still on pred taper and receiving rehab. She was unresponsive, require intubation at [facility]. She was treated for AE-COPD, RLL HCAP and was extubated 10/12. CT chest 10/13 showed RLL infiltrate with small effusions. She transferred to [facility] 10/16 for further care. She refused BiPAP 10/16 hs and was hypercapneic am 10/17. BiPAp was placed but she did not improve. She had a resp arrest that resulted in < 10 minutes ACLS. She transfers to [facility] ICU for further care.  Discharged post tracheostomy to [facility] 11/5 and PCCM consulted for vent management 11/9.  TUBES/LINES:  ETT 10/16 >> 10/26  ETT 11/02 >> 11/04 Trach (JY) 11/04 >> L PICC 10/20>>> PEG 11/9>>  STUDIES:  10/13 CT chest >> RLL infiltrate, B small effusions, L renal angiolipoma 10/18 CT head >> Chronic R BG lacunar infarct vs dilated prevascular space. No evidence CVA or global anoxia 10/29 Doppler legs >> no DVT 10/30 Echo >> mild LVH, EF 06%, mild RV systolic dysfx 23/76 CT chest >> no PE, b/l ATX with small b/l effusions  SIGNIFICANT EVENTS: 10/17 Transfer to [facility] 10/26 Extubated 10/29 Transfer out of ICU 11/01 Altered mental status, respiratory >> was off CPAP/oxygen 11/02 Off pressors 11/04 Tracheostomy by JY 11/5 tx to ssh. 11/9 PCCM consulted for vent management  MICRO:  Urine 11/2>>> enterococcus  Sputum 11/2>>normal flora  CDiff 10/20>>>neg  BCx2 10/17>>>neg   SUBJECTIVE: No events overnight.  VITAL  SIGNS: Vital signs reviewed. Abnormal values will appear under impression plan section.   INTAKE / OUTPUT: No intake or output data in the 24 hours ending 11/05/14 0848  PHYSICAL EXAMINATION: General: no distress, comfortable on ATC Neuro: RASS 1, normal strength, moves extremities HEENT: trach site clean Cardiovascular: Regular, no murmur Lungs: thick scattered rhonchi.  Abdomen: Soft, non tender, tf via peg Musculoskeletal: no edema Skin: No rashes LABS:  CBC  Recent Labs Lab 11/02/14 0500 11/05/14 0500  WBC 5.1 7.6  HGB 7.8* 10.3*  HCT 27.9* 34.5*  PLT 152 218   Coag's No results for input(s): APTT, INR in the last 168 hours. BMET  Recent Labs Lab 11/02/14 1400  NA 139  K 4.5  CL 90*  CO2 36*  BUN 21  CREATININE 0.55  GLUCOSE 184*   Electrolytes  Recent Labs Lab 11/02/14 1400  CALCIUM 10.2    Imaging No results found.   ASSESSMENT / PLAN:   PULMONARY ETT 10/16 >> 10/26  ETT 11/02 >> 11/04 Trach Hyman Bible) 11/04 >>  Assessment  Acute on chronic hypoxic/hypercapnic respiratory failure 2nd to HCAP, ATX, COPD, OSA, OHS.  Discussion  Appears comfortable off vent. Having some trouble w/ phonation currently but has been doing PMV. Would not decrease to 4 currently. Secretions are thick. Cough not great.   Plan:  Cont ATC and routine trach care Cont PMV. Would NOT downsize at this point. Could potentially have 4 but not yet  F/u CXR intermittently Continue scheduled BDs   BABCOCK,PETE 11/05/2014 8:48 AM  STAFF NOTE: Linwood Dibbles, MD FACP have  personally reviewed patient's available data, including medical history, events of note, physical examination and test results as part of my evaluation. I have discussed with resident/NP and other care providers such as pharmacist, RN and RRT. In addition, I personally evaluated patient and elicited key findings of: secretions remain an issue, unable to downsize to 4 as of now, when secretions  improve wirll downsize to 4, likely cap not tolerated also because of size trach 6.   Lavon Paganini. Titus Mould, MD, [place] Pgr: [place] [facility] 11/05/2014 12:22 PM

## 2014-11-06 ENCOUNTER — Other Ambulatory Visit (HOSPITAL_COMMUNITY): Payer: Self-pay

## 2014-11-07 ENCOUNTER — Other Ambulatory Visit (HOSPITAL_COMMUNITY): Payer: Self-pay

## 2014-11-07 LAB — BASIC METABOLIC PANEL
Anion gap: 15 (ref 5–15)
BUN: 32 mg/dL — ABNORMAL HIGH (ref 6–23)
CO2: 35 mEq/L — ABNORMAL HIGH (ref 19–32)
Calcium: 10.2 mg/dL (ref 8.4–10.5)
Chloride: 92 mEq/L — ABNORMAL LOW (ref 96–112)
Creatinine, Ser: 0.66 mg/dL (ref 0.50–1.10)
GFR calc non Af Amer: 87 mL/min — ABNORMAL LOW (ref >90)
Glucose, Bld: 139 mg/dL — ABNORMAL HIGH (ref 70–99)
Potassium: 3.6 mEq/L — ABNORMAL LOW (ref 3.7–5.3)
Sodium: 142 mEq/L (ref 137–147)

## 2014-11-07 LAB — CBC
HCT: 34.5 % — ABNORMAL LOW (ref 36.0–46.0)
Hemoglobin: 10.2 g/dL — ABNORMAL LOW (ref 12.0–15.0)
MCH: 23.9 pg — ABNORMAL LOW (ref 26.0–34.0)
MCHC: 29.6 g/dL — ABNORMAL LOW (ref 30.0–36.0)
MCV: 80.8 fL (ref 78.0–100.0)
Platelets: 254 10*3/uL (ref 150–400)
RBC: 4.27 MIL/uL (ref 3.87–5.11)
RDW: 14.9 % (ref 11.5–15.5)
WBC: 7.2 10*3/uL (ref 4.0–10.5)

## 2014-11-07 LAB — LIPASE, BLOOD: LIPASE: 48 U/L (ref 11–59)

## 2014-11-08 DIAGNOSIS — J438 Other emphysema: Secondary | ICD-10-CM

## 2014-11-08 LAB — URINALYSIS, ROUTINE W REFLEX MICROSCOPIC
Bilirubin Urine: NEGATIVE
Glucose, UA: NEGATIVE mg/dL
Hgb urine dipstick: NEGATIVE
Ketones, ur: NEGATIVE mg/dL
Nitrite: NEGATIVE
Protein, ur: NEGATIVE mg/dL
Specific Gravity, Urine: 1.016 (ref 1.005–1.030)
Urobilinogen, UA: 0.2 mg/dL (ref 0.0–1.0)
pH: 5.5 (ref 5.0–8.0)

## 2014-11-08 LAB — URINE MICROSCOPIC-ADD ON

## 2014-11-08 NOTE — Progress Notes (Signed)
PULMONARY / CRITICAL CARE MEDICINE   Name: Debbie Pena MRN: [id number] DOB: [birth year]    ADMISSION DATE:  10/11/2014 CONSULTATION DATE: 11/9  REFERRING MD :  [facility]  CHIEF COMPLAINT:  Cardiac arrest  INITIAL PRESENTATION: 68 yo woman with chronic resp failure due to COPD, untreated OSA/OHS, medical non-compliance. Also with HTN, schizoaffective d/o, GERD. She has been admitted twice in last several weeks for acute respiratory failure, most recently to [facility] 10/9 from SNF where she was still on pred taper and receiving rehab. She was unresponsive, require intubation at [facility]. She was treated for AE-COPD, RLL HCAP and was extubated 10/12. CT chest 10/13 showed RLL infiltrate with small effusions. She transferred to [facility] 10/16 for further care. She refused BiPAP 10/16 hs and was hypercapneic am 10/17. BiPAp was placed but she did not improve. She had a resp arrest that resulted in < 10 minutes ACLS. She transfers to [facility] ICU for further care.  Discharged post tracheostomy to [facility] 11/5 and PCCM consulted for vent management 11/9.  TUBES/LINES:  ETT 10/16 >> 10/26  ETT 11/02 >> 11/04 Trach (JY) 11/04 >> L PICC 10/20>>> PEG 11/9>>  STUDIES:  10/13 CT chest >> RLL infiltrate, B small effusions, L renal angiolipoma 10/18 CT head >> Chronic R BG lacunar infarct vs dilated prevascular space. No evidence CVA or global anoxia 10/29 Doppler legs >> no DVT 10/30 Echo >> mild LVH, EF 14%, mild RV systolic dysfx 48/18 CT chest >> no PE, b/l ATX with small b/l effusions  SIGNIFICANT EVENTS: 10/17 Transfer to [facility] 10/26 Extubated 10/29 Transfer out of ICU 11/01 Altered mental status, respiratory >> was off CPAP/oxygen 11/02 Off pressors 11/04 Tracheostomy by JY 11/5 tx to ssh. 11/9 PCCM consulted for vent management  MICRO:  Urine 11/2>>> enterococcus  Sputum 11/2>>normal flora  CDiff 10/20>>>neg  BCx2 10/17>>>neg   SUBJECTIVE: No events overnight.  VITAL  SIGNS: Vital signs reviewed. Abnormal values will appear under impression plan section.   INTAKE / OUTPUT: No intake or output data in the 24 hours ending 11/08/14 0859  PHYSICAL EXAMINATION: General: no distress, comfortable on ATC, able to phonate Neuro: RASS 1, normal strength, moves extremities HEENT: trach site clean Cardiovascular: Regular, no murmur Lungs: thick scattered rhonchi.  Abdomen: Soft, non tender, tf via peg Musculoskeletal: no edema Skin: No rashes LABS:  CBC  Recent Labs Lab 11/02/14 0500 11/05/14 0500 11/07/14 0645  WBC 5.1 7.6 7.2  HGB 7.8* 10.3* 10.2*  HCT 27.9* 34.5* 34.5*  PLT 152 218 254   Coag's No results for input(s): APTT, INR in the last 168 hours. BMET  Recent Labs Lab 11/02/14 1400 11/07/14 0645  NA 139 142  K 4.5 3.6*  CL 90* 92*  CO2 36* 35*  BUN 21 32*  CREATININE 0.55 0.66  GLUCOSE 184* 139*   Electrolytes  Recent Labs Lab 11/02/14 1400 11/07/14 0645  CALCIUM 10.2 10.2    Imaging Dg Abd Portable 1v  11/07/2014   CLINICAL DATA:  Ileus.  Abdominal pain appear  EXAM: PORTABLE ABDOMEN - 1 VIEW  COMPARISON:  CT 10/22/2014.  KUB 10/11/2014.  FINDINGS: Gastrostomy tube in stable position. Oral contrast noted in the colon. No bowel distention. No gastric distention. Degenerative changes lumbar spine and both hips.  IMPRESSION: Gastrostomy tube in good anatomic position. No gastric distention. Oral contrast noted in the colon. No evidence of bowel distention.   Electronically Signed   By: Marcello Moores  Register   On: 11/07/2014 07:33  ASSESSMENT / PLAN:   PULMONARY ETT 10/16 >> 10/26  ETT 11/02 >> 11/04 Trach Hyman Bible) 11/04 >>  Assessment  Acute on chronic hypoxic/hypercapnic respiratory failure 2nd to HCAP, ATX, COPD, OSA, OHS.  Discussion  Appears comfortable off vent. Having some trouble w/ phonation currently but has been doing PMV. Would not decrease to 4 currently. Secretions are thick. Cough not great.    Plan:  Cont ATC and routine trach care Cont PMV. Consider downsizing trach in future, secretions are decreased 12/3 F/u CXR intermittently Continue scheduled BDs   [facility] ACNP Maryanna Shape PCCM Pager [phone] till 3 pm If no answer page [phone] 11/08/2014, 9:00 AM   STAFF NOTE: I, Merrie Roof, MD FACP have personally reviewed patient's available data, including medical history, events of note, physical examination and test results as part of my evaluation. I have discussed with resident/NP and other care providers such as pharmacist, RN and RRT. In addition, I personally evaluated patient and elicited key findings of: no distress at all at rest on ATC, failed plug bad, would continue slow progressive efforts to extend cap timing f trach, Monday wold downsize to 4 cuffless if secretion remain low  Lavon Paganini. Titus Mould, MD, [place] Pgr: [place] [facility] 11/08/2014 5:19 PM

## 2014-11-09 ENCOUNTER — Other Ambulatory Visit (HOSPITAL_COMMUNITY): Payer: Medicare Other

## 2014-11-09 LAB — URINE CULTURE
Colony Count: 40000
Special Requests: NORMAL

## 2014-11-12 NOTE — Progress Notes (Signed)
PULMONARY / CRITICAL CARE MEDICINE   Name: Debbie Pena MRN: [id number] DOB: Dec 29, 1945    ADMISSION DATE:  10/11/2014 CONSULTATION DATE: 11/9  REFERRING MD :  [facility]  CHIEF COMPLAINT:  Cardiac arrest  INITIAL PRESENTATION: 68 yo woman with chronic resp failure due to COPD, untreated OSA/OHS, medical non-compliance. Also with HTN, schizoaffective d/o, GERD. She has been admitted twice in last several weeks for acute respiratory failure, most recently to [facility] 10/9 from SNF where she was still on pred taper and receiving rehab. She was unresponsive, require intubation at [facility] She was treated for AE-COPD, RLL HCAP and was extubated 10/12. CT chest 10/13 showed RLL infiltrate with small effusions. She transferred to [facility] 10/16 for further care. She refused BiPAP 10/16 hs and was hypercapneic am 10/17. BiPAp was placed but she did not improve. She had a resp arrest that resulted in < 10 minutes ACLS. She transfers to [facility] ICU for further care.  Discharged post tracheostomy to [facility] 11/5 and PCCM consulted for vent management 11/9.  TUBES/LINES:  ETT 10/16 >> 10/26  ETT 11/02 >> 11/04 Trach (JY) 11/04 >> L PICC 10/20>>> PEG 11/9>>  STUDIES:  10/13 CT chest >> RLL infiltrate, B small effusions, L renal angiolipoma 10/18 CT head >> Chronic R BG lacunar infarct vs dilated prevascular space. No evidence CVA or global anoxia 10/29 Doppler legs >> no DVT 10/30 Echo >> mild LVH, EF 56%, mild RV systolic dysfx 21/30 CT chest >> no PE, b/l ATX with small b/l effusions  SIGNIFICANT EVENTS: 10/17 Transfer to [facility] 10/26 Extubated 10/29 Transfer out of ICU 11/01 Altered mental status, respiratory >> was off CPAP/oxygen 11/02 Off pressors 11/04 Tracheostomy by JY 11/5 tx to ssh. 11/9 PCCM consulted for vent management  MICRO:  Urine 11/2>>> enterococcus  Sputum 11/2>>normal flora  CDiff 10/20>>>neg  BCx2 10/17>>>neg   SUBJECTIVE: No events overnight.  VITAL  SIGNS: Vital signs reviewed. Abnormal values will appear under impression plan section.   INTAKE / OUTPUT: No intake or output data in the 24 hours ending 11/12/14 1349  PHYSICAL EXAMINATION: General: no distress, comfortable on ATC, able to phonate Neuro: RASS 1, normal strength, moves extremities HEENT: trach site clean Cardiovascular: Regular, no murmur Lungs: thick scattered rhonchi.  Abdomen: Soft, non tender, tf via peg Musculoskeletal: no edema Skin: No rashes LABS:  CBC  Recent Labs Lab 11/07/14 0645  WBC 7.2  HGB 10.2*  HCT 34.5*  PLT 254   Coag's No results for input(s): APTT, INR in the last 168 hours. BMET  Recent Labs Lab 11/07/14 0645  NA 142  K 3.6*  CL 92*  CO2 35*  BUN 32*  CREATININE 0.66  GLUCOSE 139*   Electrolytes  Recent Labs Lab 11/07/14 0645  CALCIUM 10.2    Imaging No results found.   ASSESSMENT / PLAN:   PULMONARY ETT 10/16 >> 10/26  ETT 11/02 >> 11/04 Trach [facility]) 11/04 >>  Assessment  Acute on chronic hypoxic/hypercapnic respiratory failure 2nd to HCAP, ATX, COPD, OSA, OHS.  Discussion  Appears comfortable off vent. Having some trouble w/ phonation currently but has been doing PMV. Would not decrease to 4 currently. Secretions are thick. Cough not great.   Plan:  Cont ATC and routine trach care Cont PMV. Consider downsizing trach in future, secretions are decreased 12/3 F/u CXR intermittently Continue scheduled BDs Discharge to SNF today, maintain on TC, no decannulation. PCCM will sign off, please call back if needed.  Rush Farmer, M.D.  [place]. Pager: [phone]. After hours pager: [phone].

## 2014-12-03 ENCOUNTER — Inpatient Hospital Stay (HOSPITAL_COMMUNITY): Admission: RE | Admit: 2014-12-03 | Payer: Medicare Other | Source: Ambulatory Visit

## 2014-12-17 ENCOUNTER — Ambulatory Visit (HOSPITAL_COMMUNITY)
Admission: RE | Admit: 2014-12-17 | Discharge: 2014-12-17 | Disposition: A | Discharge status: Home / Self Care | Payer: Medicare Other | Source: Ambulatory Visit | Attending: Pulmonary Disease | Admitting: Pulmonary Disease

## 2014-12-17 DIAGNOSIS — Z430 Encounter for attention to tracheostomy: Secondary | ICD-10-CM | POA: Insufficient documentation

## 2014-12-17 DIAGNOSIS — Z87891 Personal history of nicotine dependence: Secondary | ICD-10-CM | POA: Insufficient documentation

## 2014-12-17 DIAGNOSIS — E785 Hyperlipidemia, unspecified: Secondary | ICD-10-CM | POA: Diagnosis not present

## 2014-12-17 DIAGNOSIS — G9340 Encephalopathy, unspecified: Secondary | ICD-10-CM | POA: Diagnosis not present

## 2014-12-17 DIAGNOSIS — G4733 Obstructive sleep apnea (adult) (pediatric): Secondary | ICD-10-CM | POA: Insufficient documentation

## 2014-12-17 DIAGNOSIS — J449 Chronic obstructive pulmonary disease, unspecified: Secondary | ICD-10-CM | POA: Insufficient documentation

## 2014-12-17 DIAGNOSIS — I10 Essential (primary) hypertension: Secondary | ICD-10-CM | POA: Diagnosis not present

## 2014-12-17 DIAGNOSIS — K219 Gastro-esophageal reflux disease without esophagitis: Secondary | ICD-10-CM | POA: Diagnosis not present

## 2014-12-17 DIAGNOSIS — E662 Morbid (severe) obesity with alveolar hypoventilation: Secondary | ICD-10-CM | POA: Insufficient documentation

## 2014-12-17 DIAGNOSIS — R5381 Other malaise: Secondary | ICD-10-CM | POA: Diagnosis not present

## 2014-12-17 DIAGNOSIS — J9611 Chronic respiratory failure with hypoxia: Secondary | ICD-10-CM | POA: Diagnosis not present

## 2014-12-17 DIAGNOSIS — Z930 Tracheostomy status: Secondary | ICD-10-CM

## 2014-12-17 NOTE — Progress Notes (Signed)
Consultation provided as part of trach f/u clinic. Patient with mildly decreased breath support for verbal output at the conversation level as well as daughter with c/o some difficulty expectorating secretions. Green (in-line) PMSV in place. Patient not on vent, TC only. Suggested purple PMSV instead of green to minimize resistance of inhaled air via trach hub for possible improved words/breath and stronger cough response for clearing secretions. Purple PMSV provided for patient. O2 levels monitored for approximately 5-6 minutes without changes. All remained WFL. No evidence of air trapping during brief observation. PMSV billed to patient. CCM NP present and aware.   [place], [place] [phone]

## 2014-12-17 NOTE — Progress Notes (Signed)
CC: trach change/care   Brief History  despite BiPAP, resulted in resp arrest and PEA < 10 minutes. Pt intubated, stabilized and transferred to [facility] ICU for further care. She was treated with broad spectrum abx and diuresis and initially improved and was extubated 10/26 and tx to floor. Course was c/b tachycardia, AKI (resolved), ongoing encephalopathy and severe deconditioning. On 11/1 the patient worsening with respiratory distress and lethargy, thought secondary to ?aspiration and decompensated OSA/OHS. She ultimately required re-intubation and tx back to ICU. She was stabilized and underwent tracheostomy placement 11/4 . At the time of d/c she is tolerating some PS wean, mental status is much improved. She remains severely deconditioned and will need ongoing care for vent weaning and rehab efforts. She medically cleared for d/c back to [facility].  Presents today from SNF for trach change  Past Medical History  Diagnosis Date  . COPD (chronic obstructive pulmonary disease)   . Obesity hypoventilation syndrome   . OSA (obstructive sleep apnea)   . HTN (hypertension)   . Schizoaffective disorder   . GERD (gastroesophageal reflux disease)   . Chronic respiratory failure with hypoxia   . Hyperlipidemia   . Renal angiolipoma   . Hepatic cyst    No family history on file. Allergies  Allergen Reactions  . Klonopin [Clonazepam]   . Penicillins Other (See Comments)   History   Social History  . Marital Status: Single    Spouse Name: N/A    Number of Children: N/A  . Years of Education: N/A   Occupational History  . Not on file.   Social History Main Topics  . Smoking status: Former Research scientist (life sciences)  . Smokeless tobacco: Not on file  . Alcohol Use: Yes  . Drug Use: No  . Sexual Activity: Not on file   Other Topics Concern  . Not on file   Social History Narrative   @[employer] @  Review of Systems:   Bolds are positive  Constitutional: weight loss, gain, night sweats,  Fevers, chills, fatigue .  HEENT: headaches, Sore throat, sneezing, nasal congestion, post nasal drip, Difficulty swallowing, Tooth/dental problems, visual complaints visual changes, ear ache CV:  chest pain, radiates: ,Orthopnea, PND, swelling in lower extremities, dizziness, palpitations, syncope.  GI  heartburn, indigestion, abdominal pain, nausea, vomiting, diarrhea, change in bowel habits, loss of appetite, bloody stools.  Resp: cough, productive: , hemoptysis, dyspnea, chest pain, pleuritic.  Skin: rash or itching or icterus GU: dysuria, change in color of urine, urgency or frequency. flank pain, hematuria  MS: joint pain or swelling. decreased range of motion  Psych: change in mood or affect. depression or anxiety.  Neuro: difficulty with speech, weakness, numbness, ataxia   There were no vitals taken for this visit.  General appearance:  69 year old aaf Mouth:  membranes and no mucosal ulcerations; normal hard and soft palate Neck: Trachea midline, # 6 trach, cuffless trach stoma unremarkable.  Lungs: CTA, with normal respiratory effort and no intercostal retractions CV: RRR, no MRGs  Abdomen: Soft, non-tender; no masses or HSM Extremities: No peripheral edema or extremity lymphadenopathy Skin: Normal temperature, turgor and texture; no rash, ulcers or subcutaneous nodules Psych: Appropriate affect, alert and oriented to person, place and time  Date  Last Seen by:  Lurline Idol size Trach change   pmv  Capping  Stoma assessment  Diet  Goals   1/11 PB 6 yes yes no Looked clean regular Change to regular PMV from green vent PMV. No decannulation for  now.                        Impression  Obesity hypoventilation syndrome/ OSA (obstructive sleep apnea) Tracheostomy Status COPD  Plan Not candidate for decannulation ROV 3 months Change PMV to purple valve.   Erick Colace ACNP-BC [place] Pager # [phone] OR # [phone] if no answer

## 2014-12-17 NOTE — Progress Notes (Signed)
Tracheostomy Procedure Note  [name] [id number] 04-20-1946  Pre Procedure Tracheostomy Information  Trach Brand: Shiley Size: 6 cfs Style: Uncuffed Secured by: Velcro Vitals:  Blood pressure 146/65, HR 100, RR 18, SpO2 98%   Procedure:   Trach care, cleaning, change and education.      Post Procedure Tracheostomy Information  Trach Brand: Shiley Size: 6.0 cfs Style: Uncuffed Secured by: Velcro   Post Procedure Evaluation:  ETCO2 positive color change from yellow to purple : Yes.   Vital signs:blood pressure 146/65, pulse 100, respirations 18 and pulse oximetry 98 % on 3l trach collar Patients current condition: stable Complications: No apparent complications Trach site exam: clean, dry, good granulation tissue Wound care done: dry and 4 x 4 gauze Patient did tolerate procedure well.   Education:  none Prescription needs: none   Additional needs:  Speech therapy present.  Patient given new passey muir valve. Vitals prior to discharge:  Blood pressure 149/79, HR 101, RR 18, SpO2 97% on 3l trach collar

## 2015-02-19 ENCOUNTER — Other Ambulatory Visit (HOSPITAL_COMMUNITY): Payer: Self-pay

## 2015-02-19 ENCOUNTER — Inpatient Hospital Stay
Admission: AD | Admit: 2015-02-19 | Discharge: 2015-03-27 | Disposition: A | Discharge status: Home Health | Payer: Self-pay | Source: Ambulatory Visit | Attending: Internal Medicine | Admitting: Internal Medicine

## 2015-02-19 DIAGNOSIS — Z931 Gastrostomy status: Secondary | ICD-10-CM

## 2015-02-19 DIAGNOSIS — R609 Edema, unspecified: Secondary | ICD-10-CM

## 2015-02-19 DIAGNOSIS — J9690 Respiratory failure, unspecified, unspecified whether with hypoxia or hypercapnia: Secondary | ICD-10-CM

## 2015-02-19 DIAGNOSIS — R52 Pain, unspecified: Secondary | ICD-10-CM

## 2015-02-19 DIAGNOSIS — J9601 Acute respiratory failure with hypoxia: Secondary | ICD-10-CM | POA: Diagnosis present

## 2015-02-19 DIAGNOSIS — J9602 Acute respiratory failure with hypercapnia: Secondary | ICD-10-CM

## 2015-02-19 LAB — BLOOD GAS, ARTERIAL
Acid-Base Excess: 8 mmol/L — ABNORMAL HIGH (ref 0.0–2.0)
Bicarbonate: 30.6 mEq/L — ABNORMAL HIGH (ref 20.0–24.0)
FIO2: 0.3 %
O2 Saturation: 96.9 %
PEEP: 5 cmH2O
PH ART: 7.59 — AB (ref 7.350–7.450)
PO2 ART: 80.5 mmHg (ref 80.0–100.0)
Patient temperature: 98.6
RATE: 14 resp/min
TCO2: 31.6 mmol/L (ref 0–100)
VT: 450 mL
pCO2 arterial: 31.8 mmHg — ABNORMAL LOW (ref 35.0–45.0)

## 2015-02-19 LAB — PROTIME-INR
INR: 1.1 (ref 0.00–1.49)
Prothrombin Time: 14.4 seconds (ref 11.6–15.2)

## 2015-02-20 LAB — CBC WITH DIFFERENTIAL/PLATELET
Basophils Absolute: 0 10*3/uL (ref 0.0–0.1)
Basophils Relative: 0 % (ref 0–1)
Eosinophils Absolute: 0.1 10*3/uL (ref 0.0–0.7)
Eosinophils Relative: 1 % (ref 0–5)
HCT: 32.1 % — ABNORMAL LOW (ref 36.0–46.0)
Hemoglobin: 10 g/dL — ABNORMAL LOW (ref 12.0–15.0)
LYMPHS ABS: 0.7 10*3/uL (ref 0.7–4.0)
Lymphocytes Relative: 6 % — ABNORMAL LOW (ref 12–46)
MCH: 25.4 pg — AB (ref 26.0–34.0)
MCHC: 31.2 g/dL (ref 30.0–36.0)
MCV: 81.7 fL (ref 78.0–100.0)
MONO ABS: 0.8 10*3/uL (ref 0.1–1.0)
Monocytes Relative: 7 % (ref 3–12)
NEUTROS ABS: 10.2 10*3/uL — AB (ref 1.7–7.7)
NEUTROS PCT: 86 % — AB (ref 43–77)
Platelets: 288 10*3/uL (ref 150–400)
RBC: 3.93 MIL/uL (ref 3.87–5.11)
RDW: 16.3 % — ABNORMAL HIGH (ref 11.5–15.5)
WBC: 11.9 10*3/uL — ABNORMAL HIGH (ref 4.0–10.5)

## 2015-02-20 LAB — COMPREHENSIVE METABOLIC PANEL
ALT: 13 U/L (ref 0–35)
ANION GAP: 9 (ref 5–15)
AST: 13 U/L (ref 0–37)
Albumin: 2.5 g/dL — ABNORMAL LOW (ref 3.5–5.2)
Alkaline Phosphatase: 64 U/L (ref 39–117)
BUN: 14 mg/dL (ref 6–23)
CO2: 33 mmol/L — ABNORMAL HIGH (ref 19–32)
CREATININE: 0.62 mg/dL (ref 0.50–1.10)
Calcium: 9.3 mg/dL (ref 8.4–10.5)
Chloride: 97 mmol/L (ref 96–112)
GFR calc Af Amer: >90 mL/min (ref >90)
GFR calc non Af Amer: 89 mL/min — ABNORMAL LOW (ref >90)
Glucose, Bld: 147 mg/dL — ABNORMAL HIGH (ref 70–99)
Potassium: 3.3 mmol/L — ABNORMAL LOW (ref 3.5–5.1)
Sodium: 139 mmol/L (ref 135–145)
TOTAL PROTEIN: 6.2 g/dL (ref 6.0–8.3)
Total Bilirubin: 0.4 mg/dL (ref 0.3–1.2)

## 2015-02-20 LAB — BLOOD GAS, ARTERIAL
ACID-BASE EXCESS: 8.6 mmol/L — AB (ref 0.0–2.0)
BICARBONATE: 32.9 meq/L — AB (ref 20.0–24.0)
FIO2: 0.3 %
O2 Saturation: 98.7 %
PCO2 ART: 47.5 mmHg — AB (ref 35.0–45.0)
PEEP: 5 cmH2O
PH ART: 7.455 — AB (ref 7.350–7.450)
Patient temperature: 98.6
RATE: 14 resp/min
TCO2: 34.3 mmol/L (ref 0–100)
VT: 450 mL
pO2, Arterial: 131 mmHg — ABNORMAL HIGH (ref 80.0–100.0)

## 2015-02-20 LAB — MAGNESIUM: MAGNESIUM: 2.1 mg/dL (ref 1.5–2.5)

## 2015-02-20 LAB — PROCALCITONIN: Procalcitonin: 0.14 ng/mL

## 2015-02-20 LAB — PHOSPHORUS: PHOSPHORUS: 3.7 mg/dL (ref 2.3–4.6)

## 2015-02-20 LAB — TSH: TSH: 1.744 u[IU]/mL (ref 0.350–4.500)

## 2015-02-20 LAB — VITAMIN B12: Vitamin B-12: 882 pg/mL (ref 211–911)

## 2015-02-20 LAB — T4, FREE: FREE T4: 1.46 ng/dL (ref 0.80–1.80)

## 2015-02-21 LAB — HEMOGLOBIN A1C
Hgb A1c MFr Bld: 6.1 % — ABNORMAL HIGH (ref 4.8–5.6)
Mean Plasma Glucose: 128 mg/dL

## 2015-02-21 LAB — FOLATE RBC
Folate, Hemolysate: 612.6 ng/mL
Folate, RBC: 1945 ng/mL (ref >498)
HEMATOCRIT: 31.5 % — AB (ref 34.0–46.6)

## 2015-02-21 LAB — POTASSIUM: Potassium: 4.3 mmol/L (ref 3.5–5.1)

## 2015-02-21 NOTE — Consult Note (Signed)
Chief Complaint: Chronic dysphagia Deconditioning  Referring Physician(s): Dr Laren Everts  History of Present Illness: Debbie Pena is a 69 y.o. female   Pt has had previous percutaneous gastric tube placed 10/2014 by IR at [facility] Was discharged to outside facility and home G tube was removed in January per pt Now re admitted with GI bleed---resolved Pt on trach collar secondary resp failure/hypoxia: previous admission to [facility] Chronic dysphagia per nutrition evaluation Protein calorie malnutrition Deconditioning Request for replacement of percutaneous gastric tube Imaging reviewed with Dr Earleen Newport; discussed case- he has approved procedure   Past Medical History  Diagnosis Date  . COPD (chronic obstructive pulmonary disease)   . Obesity hypoventilation syndrome   . OSA (obstructive sleep apnea)   . HTN (hypertension)   . Schizoaffective disorder   . GERD (gastroesophageal reflux disease)   . Chronic respiratory failure with hypoxia   . Hyperlipidemia   . Renal angiolipoma   . Hepatic cyst     No past surgical history on file.  Allergies: Klonopin and Penicillins  Medications: Prior to Admission medications   Medication Sig Start Date End Date Taking? Authorizing Provider  albuterol (PROVENTIL) (2.5 MG/3ML) 0.083% nebulizer solution Take 3 mLs (2.5 mg total) by nebulization every 3 (three) hours as needed for wheezing or shortness of breath. 10/11/14   Marijean Heath, NP  Amino Acids-Protein Hydrolys (FEEDING SUPPLEMENT, PRO-STAT SUGAR FREE 64,) LIQD Place 30 mLs into feeding tube 2 (two) times daily at 10 AM and 5 PM. 10/11/14   Marijean Heath, NP  aspirin 81 MG tablet Take 81 mg by mouth daily.    Historical Provider, MD  atorvastatin (LIPITOR) 20 MG tablet Take 20 mg by mouth daily.    Historical Provider, MD  chlorhexidine (PERIDEX) 0.12 % solution 15 mLs by Mouth Rinse route 2 (two) times daily. 10/11/14   Marijean Heath, NP  enoxaparin (LOVENOX)  40 MG/0.4ML injection Inject 0.4 mLs (40 mg total) into the skin daily. 10/11/14   Marijean Heath, NP  feeding supplement, ENSURE COMPLETE, (ENSURE COMPLETE) LIQD Take 237 mLs by mouth 2 (two) times daily between meals. 10/07/14   Thurnell Lose, MD  fentaNYL (SUBLIMAZE) SOLN Inject 25-50 mcg into the vein every hour as needed. 10/11/14   Marijean Heath, NP  insulin aspart (NOVOLOG) 100 UNIT/ML injection Inject 0-15 Units into the skin every 4 (four) hours. 10/11/14   Marijean Heath, NP  ipratropium-albuterol (DUONEB) 0.5-2.5 (3) MG/3ML SOLN Take 3 mLs by nebulization every 6 (six) hours. 10/11/14   Marijean Heath, NP  LORazepam (ATIVAN) 2 MG/ML injection Inject 0.5 mLs (1 mg total) into the vein every 4 (four) hours as needed for anxiety. 10/11/14   Marijean Heath, NP  Nutritional Supplements (FEEDING SUPPLEMENT, VITAL AF 1.2 CAL,) LIQD Place 1,000 mLs into feeding tube continuous. 10/11/14   Marijean Heath, NP  pantoprazole sodium (PROTONIX) 40 mg/20 mL PACK Place 20 mLs (40 mg total) into feeding tube daily. 10/11/14   Marijean Heath, NP  risperiDONE (RISPERDAL) 0.5 MG tablet Take 1 tablet (0.5 mg total) by mouth at bedtime. 10/11/14   Marijean Heath, NP  vancomycin (VANCOCIN) 1 GM/200ML SOLN Inject 200 mLs (1,000 mg total) into the vein every 12 (twelve) hours. 10/11/14   Marijean Heath, NP     No family history on file.  History   Social History  . Marital Status: Single    Spouse Name: N/A  . Number  of Children: N/A  . Years of Education: N/A   Social History Main Topics  . Smoking status: Former Research scientist (life sciences)  . Smokeless tobacco: Not on file  . Alcohol Use: Yes  . Drug Use: No  . Sexual Activity: Not on file   Other Topics Concern  . Not on file   Social History Narrative     Review of Systems: A 12 point ROS discussed and pertinent positives are indicated in the HPI above.  All other systems are negative.  Review of Systems  HENT:  Positive for congestion.        Noted increase secretions from trach  Respiratory: Positive for cough. Negative for choking.   Gastrointestinal: Negative for abdominal pain.  Neurological: Positive for weakness.  Psychiatric/Behavioral: Negative for behavioral problems and confusion.     Vital Signs: There were no vitals taken for this visit.  Physical Exam  Constitutional: She is oriented to person, place, and time. She appears well-developed.  Cardiovascular: Normal rate and regular rhythm.   Pulmonary/Chest: Effort normal. She has no wheezes.  Trach collar  Abdominal: Soft. Bowel sounds are normal. There is no tenderness.  Skin well healed No sign of additional abd surgeries  Musculoskeletal: Normal range of motion.  Neurological: She is alert and oriented to person, place, and time.  Pt with trach Alert and oriented Can "mouth" all answers correctly to questions  Skin: Skin is warm and dry.  Psychiatric: She has a normal mood and affect. Her behavior is normal. Judgment and thought content normal.  Nursing note and vitals reviewed.   Mallampati Score:  MD Evaluation Airway: Other (comments) Airway comments: trach Heart: WNL Abdomen: WNL Chest/ Lungs: WNL ASA  Classification: 3 Mallampati/Airway Score:  (trach)  Imaging: Dg Chest Port 1 View  02/19/2015   CLINICAL DATA:  Nasogastric tube placement.  Respiratory failure.  EXAM: PORTABLE CHEST - 1 VIEW  COMPARISON:  11/09/2014  FINDINGS: Nasogastric tube extends into the abdomen. Densities at both lung bases could represent atelectasis and possibly pleural effusions. Heart size is grossly stable and upper limits of normal. Patient has a tracheostomy tube.  IMPRESSION: Bibasilar densities are suggestive for atelectasis. Cannot exclude small effusions.  Nasogastric tube placement.   Electronically Signed   By: Markus Daft M.D.   On: 02/19/2015 20:44   Dg Abd Portable 1v  02/19/2015   CLINICAL DATA:  Evaluate NG tube  placement.  EXAM: PORTABLE ABDOMEN - 1 VIEW  COMPARISON:  11/07/2014  FINDINGS: Nasogastric tube in the right abdomen and probably near the proximal duodenum. Nonobstructive bowel gas pattern. Few densities at the lung bases but limited evaluation.  IMPRESSION: Nasogastric tube tip in the distal stomach/proximal duodenum region.   Electronically Signed   By: Markus Daft M.D.   On: 02/19/2015 20:43    Labs:  CBC:  Recent Labs  11/02/14 0500 11/05/14 0500 11/07/14 0645 02/20/15 0748  WBC 5.1 7.6 7.2 11.9*  HGB 7.8* 10.3* 10.2* 10.0*  HCT 27.9* 34.5* 34.5* 32.1*  PLT 152 218 254 288    COAGS:  Recent Labs  09/22/14 2139 10/10/14 0420 02/19/15 1957  INR 1.16 1.10 1.10    BMP:  Recent Labs  10/28/14 0530 11/02/14 1400 11/07/14 0645 02/20/15 0748 02/21/15 0815  NA 144 139 142 139  --   K 3.7 4.5 3.6* 3.3* 4.3  CL 95* 90* 92* 97  --   CO2 34* 36* 35* 33*  --   GLUCOSE 117* 184* 139* 147*  --  BUN 22 21 32* 14  --   CALCIUM 9.9 10.2 10.2 9.3  --   CREATININE 0.53 0.55 0.66 0.62  --   GFRNONAA >90 >90 87* 89*  --   GFRAA >90 >90 >90 >90  --     LIVER FUNCTION TESTS:  Recent Labs  10/08/14 0350 10/12/14 0600 10/18/14 0706 02/20/15 0748  BILITOT <0.2* <0.2* <0.2* 0.4  AST 11 10 12 13   ALT 16 10 11 13   ALKPHOS 92 84 79 64  PROT 5.8* 5.6* 6.2 6.2  ALBUMIN 2.7* 2.4* 2.6* 2.5*    TUMOR MARKERS: No results for input(s): AFPTM, CEA, CA199, CHROMGRNA in the last 8760 hours.  Assessment and Plan:  Chronic dysphagia; PCM Deconditioning Scheduled now for replacement of percutaneous gastric tube Risks and Benefits discussed with the patient including, but not limited to the need for a barium enema during the procedure, bleeding, infection, peritonitis, or damage to adjacent structures. All of the patient's questions were answered, patient is agreeable to proceed. Consent signed and in chart. Afeb Vanco on call Lovenox held Check wbc  Thank you for this  interesting consult.  I greatly enjoyed meeting MIKEYLA MUSIC and look forward to participating in their care.  Signed: Annalysse Shoemaker A 02/21/2015, 11:32 AM   I spent a total of 40 Minutes  in face to face in clinical consultation, greater than 50% of which was counseling/coordinating care for perc G tube

## 2015-02-22 LAB — CBC
HEMATOCRIT: 33.2 % — AB (ref 36.0–46.0)
Hemoglobin: 10.3 g/dL — ABNORMAL LOW (ref 12.0–15.0)
MCH: 25.5 pg — AB (ref 26.0–34.0)
MCHC: 31 g/dL (ref 30.0–36.0)
MCV: 82.2 fL (ref 78.0–100.0)
Platelets: 295 10*3/uL (ref 150–400)
RBC: 4.04 MIL/uL (ref 3.87–5.11)
RDW: 15.9 % — ABNORMAL HIGH (ref 11.5–15.5)
WBC: 9.7 10*3/uL (ref 4.0–10.5)

## 2015-02-22 LAB — COMPREHENSIVE METABOLIC PANEL
ALT: 15 U/L (ref 0–35)
AST: 14 U/L (ref 0–37)
Albumin: 2.6 g/dL — ABNORMAL LOW (ref 3.5–5.2)
Alkaline Phosphatase: 64 U/L (ref 39–117)
Anion gap: 9 (ref 5–15)
BILIRUBIN TOTAL: 0.6 mg/dL (ref 0.3–1.2)
BUN: 13 mg/dL (ref 6–23)
CHLORIDE: 104 mmol/L (ref 96–112)
CO2: 28 mmol/L (ref 19–32)
CREATININE: 0.57 mg/dL (ref 0.50–1.10)
Calcium: 9.7 mg/dL (ref 8.4–10.5)
GLUCOSE: 108 mg/dL — AB (ref 70–99)
Potassium: 4.6 mmol/L (ref 3.5–5.1)
Sodium: 141 mmol/L (ref 135–145)
Total Protein: 6.9 g/dL (ref 6.0–8.3)

## 2015-02-22 LAB — PROTIME-INR
INR: 1.07 (ref 0.00–1.49)
Prothrombin Time: 14 seconds (ref 11.6–15.2)

## 2015-02-22 LAB — APTT: aPTT: 30 seconds (ref 24–37)

## 2015-02-23 ENCOUNTER — Other Ambulatory Visit (HOSPITAL_COMMUNITY): Payer: Self-pay

## 2015-02-23 MED ORDER — LIDOCAINE HCL 1 % IJ SOLN
INTRAMUSCULAR | Status: AC
Start: 2015-02-23 — End: 2015-02-24
  Filled 2015-02-23: qty 20

## 2015-02-23 MED ORDER — FENTANYL CITRATE 0.05 MG/ML IJ SOLN
INTRAMUSCULAR | Status: AC | PRN
  Administered 2015-02-23 (×2): 50 ug via INTRAVENOUS

## 2015-02-23 MED ORDER — GLUCAGON HCL RDNA (DIAGNOSTIC) 1 MG IJ SOLR
INTRAMUSCULAR | Status: AC
  Filled 2015-02-23: qty 1

## 2015-02-23 MED ORDER — CEFAZOLIN SODIUM-DEXTROSE 2-3 GM-% IV SOLR
INTRAVENOUS | Status: AC
  Filled 2015-02-23: qty 50

## 2015-02-23 MED ORDER — MIDAZOLAM HCL 2 MG/2ML IJ SOLN
INTRAMUSCULAR | Status: AC
  Filled 2015-02-23: qty 2

## 2015-02-23 MED ORDER — IOHEXOL 300 MG/ML  SOLN
50.0000 mL | Freq: Once | INTRAMUSCULAR | Status: AC | PRN
  Administered 2015-02-23: 10 mL via INTRAVENOUS

## 2015-02-23 MED ORDER — GLUCAGON HCL RDNA (DIAGNOSTIC) 1 MG IJ SOLR
INTRAMUSCULAR | Status: AC | PRN
  Administered 2015-02-23: 1 mg via INTRAVENOUS

## 2015-02-23 MED ORDER — MIDAZOLAM HCL 2 MG/2ML IJ SOLN
INTRAMUSCULAR | Status: AC | PRN
  Administered 2015-02-23: 0.5 mg via INTRAVENOUS
  Administered 2015-02-23: 1 mg via INTRAVENOUS

## 2015-02-23 MED ORDER — FENTANYL CITRATE 0.05 MG/ML IJ SOLN
INTRAMUSCULAR | Status: AC
  Filled 2015-02-23: qty 2

## 2015-02-23 NOTE — Sedation Documentation (Signed)
Patient denies pain and is resting comfortably.  

## 2015-02-23 NOTE — Procedures (Signed)
Interventional Radiology Procedure Note  Procedure: Placement of percutaneous 20F pull-through gastrostomy tube. Complications: None Recommendations: - NPO except for sips and chips remainder of today and overnight - Maintain G-tube to LWS until tomorrow morning  - May advance diet as tolerated and begin using tube tomorrow morning  Signed,  Crecencio Kwiatek K. Telesia Ates, MD   

## 2015-02-25 LAB — CBC WITH DIFFERENTIAL/PLATELET
BASOS ABS: 0 10*3/uL (ref 0.0–0.1)
Basophils Relative: 0 % (ref 0–1)
Eosinophils Absolute: 0.1 10*3/uL (ref 0.0–0.7)
Eosinophils Relative: 1 % (ref 0–5)
HCT: 33.4 % — ABNORMAL LOW (ref 36.0–46.0)
HEMOGLOBIN: 10.3 g/dL — AB (ref 12.0–15.0)
LYMPHS PCT: 10 % — AB (ref 12–46)
Lymphs Abs: 0.8 10*3/uL (ref 0.7–4.0)
MCH: 25.4 pg — ABNORMAL LOW (ref 26.0–34.0)
MCHC: 30.8 g/dL (ref 30.0–36.0)
MCV: 82.3 fL (ref 78.0–100.0)
Monocytes Absolute: 0.4 10*3/uL (ref 0.1–1.0)
Monocytes Relative: 5 % (ref 3–12)
NEUTROS PCT: 84 % — AB (ref 43–77)
Neutro Abs: 7 10*3/uL (ref 1.7–7.7)
PLATELETS: 291 10*3/uL (ref 150–400)
RBC: 4.06 MIL/uL (ref 3.87–5.11)
RDW: 15.2 % (ref 11.5–15.5)
WBC: 8.3 10*3/uL (ref 4.0–10.5)

## 2015-02-25 LAB — BASIC METABOLIC PANEL
ANION GAP: 7 (ref 5–15)
BUN: 12 mg/dL (ref 6–23)
CALCIUM: 9.4 mg/dL (ref 8.4–10.5)
CO2: 27 mmol/L (ref 19–32)
CREATININE: 0.55 mg/dL (ref 0.50–1.10)
Chloride: 104 mmol/L (ref 96–112)
GFR calc Af Amer: >90 mL/min (ref >90)
GFR calc non Af Amer: >90 mL/min (ref >90)
Glucose, Bld: 171 mg/dL — ABNORMAL HIGH (ref 70–99)
Potassium: 4.5 mmol/L (ref 3.5–5.1)
Sodium: 138 mmol/L (ref 135–145)

## 2015-02-28 ENCOUNTER — Other Ambulatory Visit (HOSPITAL_COMMUNITY): Payer: Self-pay

## 2015-03-05 LAB — CBC
HCT: 35.4 % — ABNORMAL LOW (ref 36.0–46.0)
HEMOGLOBIN: 10.8 g/dL — AB (ref 12.0–15.0)
MCH: 25.2 pg — AB (ref 26.0–34.0)
MCHC: 30.5 g/dL (ref 30.0–36.0)
MCV: 82.5 fL (ref 78.0–100.0)
Platelets: 236 10*3/uL (ref 150–400)
RBC: 4.29 MIL/uL (ref 3.87–5.11)
RDW: 14.6 % (ref 11.5–15.5)
WBC: 6.1 10*3/uL (ref 4.0–10.5)

## 2015-03-05 LAB — BASIC METABOLIC PANEL
Anion gap: 4 — ABNORMAL LOW (ref 5–15)
BUN: 18 mg/dL (ref 6–23)
CO2: 37 mmol/L — AB (ref 19–32)
Calcium: 9.5 mg/dL (ref 8.4–10.5)
Chloride: 98 mmol/L (ref 96–112)
Creatinine, Ser: 0.6 mg/dL (ref 0.50–1.10)
GFR calc Af Amer: >90 mL/min (ref >90)
Glucose, Bld: 112 mg/dL — ABNORMAL HIGH (ref 70–99)
Potassium: 5.1 mmol/L (ref 3.5–5.1)
Sodium: 139 mmol/L (ref 135–145)

## 2015-03-06 LAB — POTASSIUM: Potassium: 4.6 mmol/L (ref 3.5–5.1)

## 2015-03-08 LAB — POTASSIUM: Potassium: 3.9 mmol/L (ref 3.5–5.1)

## 2015-03-08 LAB — HEMOGLOBIN AND HEMATOCRIT, BLOOD
HCT: 34.6 % — ABNORMAL LOW (ref 36.0–46.0)
Hemoglobin: 10.5 g/dL — ABNORMAL LOW (ref 12.0–15.0)

## 2015-03-09 LAB — POTASSIUM: POTASSIUM: 3.9 mmol/L (ref 3.5–5.1)

## 2015-03-11 LAB — HEMOGLOBIN AND HEMATOCRIT, BLOOD
HCT: 35.8 % — ABNORMAL LOW (ref 36.0–46.0)
HEMOGLOBIN: 10.7 g/dL — AB (ref 12.0–15.0)

## 2015-03-11 LAB — POTASSIUM: Potassium: 4.6 mmol/L (ref 3.5–5.1)

## 2015-03-14 LAB — HEMOGLOBIN AND HEMATOCRIT, BLOOD
HCT: 33.3 % — ABNORMAL LOW (ref 36.0–46.0)
Hemoglobin: 10 g/dL — ABNORMAL LOW (ref 12.0–15.0)

## 2015-03-16 ENCOUNTER — Other Ambulatory Visit (HOSPITAL_COMMUNITY): Payer: Self-pay

## 2015-03-16 LAB — CBC
HEMATOCRIT: 32.4 % — AB (ref 36.0–46.0)
Hemoglobin: 9.9 g/dL — ABNORMAL LOW (ref 12.0–15.0)
MCH: 25.5 pg — ABNORMAL LOW (ref 26.0–34.0)
MCHC: 30.6 g/dL (ref 30.0–36.0)
MCV: 83.5 fL (ref 78.0–100.0)
Platelets: 200 10*3/uL (ref 150–400)
RBC: 3.88 MIL/uL (ref 3.87–5.11)
RDW: 15 % (ref 11.5–15.5)
WBC: 6.8 10*3/uL (ref 4.0–10.5)

## 2015-03-16 LAB — BASIC METABOLIC PANEL
ANION GAP: 9 (ref 5–15)
BUN: 17 mg/dL (ref 6–23)
CHLORIDE: 99 mmol/L (ref 96–112)
CO2: 34 mmol/L — AB (ref 19–32)
Calcium: 9.7 mg/dL (ref 8.4–10.5)
Creatinine, Ser: 0.54 mg/dL (ref 0.50–1.10)
GFR calc Af Amer: >90 mL/min (ref >90)
GFR calc non Af Amer: >90 mL/min (ref >90)
GLUCOSE: 89 mg/dL (ref 70–99)
Potassium: 3.9 mmol/L (ref 3.5–5.1)
SODIUM: 142 mmol/L (ref 135–145)

## 2015-03-21 LAB — HEMOGLOBIN AND HEMATOCRIT, BLOOD
HEMATOCRIT: 31.8 % — AB (ref 36.0–46.0)
Hemoglobin: 9.8 g/dL — ABNORMAL LOW (ref 12.0–15.0)

## 2015-03-22 NOTE — Progress Notes (Signed)
VASCULAR LAB PRELIMINARY  PRELIMINARY  PRELIMINARY  PRELIMINARY  LLEV Duplex completed.    Preliminary report:   Negative  DVT LLEV.    August Albino, RVT 03/22/2015, 11:11 AM

## 2015-03-24 ENCOUNTER — Other Ambulatory Visit (HOSPITAL_COMMUNITY): Payer: Medicare Other

## 2015-03-24 LAB — BASIC METABOLIC PANEL
ANION GAP: 8 (ref 5–15)
BUN: 16 mg/dL (ref 6–23)
CALCIUM: 9.4 mg/dL (ref 8.4–10.5)
CHLORIDE: 95 mmol/L — AB (ref 96–112)
CO2: 38 mmol/L — ABNORMAL HIGH (ref 19–32)
Creatinine, Ser: 0.62 mg/dL (ref 0.50–1.10)
GFR calc Af Amer: >90 mL/min (ref >90)
GFR, EST NON AFRICAN AMERICAN: 89 mL/min — AB (ref >90)
GLUCOSE: 100 mg/dL — AB (ref 70–99)
Potassium: 4.1 mmol/L (ref 3.5–5.1)
SODIUM: 141 mmol/L (ref 135–145)

## 2015-03-24 LAB — CBC WITH DIFFERENTIAL/PLATELET
Basophils Absolute: 0 10*3/uL (ref 0.0–0.1)
Basophils Relative: 0 % (ref 0–1)
EOS ABS: 0.2 10*3/uL (ref 0.0–0.7)
EOS PCT: 2 % (ref 0–5)
HEMATOCRIT: 31.6 % — AB (ref 36.0–46.0)
Hemoglobin: 9.6 g/dL — ABNORMAL LOW (ref 12.0–15.0)
LYMPHS ABS: 1.1 10*3/uL (ref 0.7–4.0)
LYMPHS PCT: 17 % (ref 12–46)
MCH: 25.7 pg — ABNORMAL LOW (ref 26.0–34.0)
MCHC: 30.4 g/dL (ref 30.0–36.0)
MCV: 84.5 fL (ref 78.0–100.0)
MONO ABS: 0.5 10*3/uL (ref 0.1–1.0)
MONOS PCT: 7 % (ref 3–12)
NEUTROS ABS: 4.9 10*3/uL (ref 1.7–7.7)
NEUTROS PCT: 74 % (ref 43–77)
PLATELETS: 202 10*3/uL (ref 150–400)
RBC: 3.74 MIL/uL — AB (ref 3.87–5.11)
RDW: 14.5 % (ref 11.5–15.5)
WBC: 6.7 10*3/uL (ref 4.0–10.5)

## 2015-03-24 LAB — MAGNESIUM: Magnesium: 1.8 mg/dL (ref 1.5–2.5)

## 2015-03-25 LAB — MAGNESIUM: Magnesium: 1.8 mg/dL (ref 1.5–2.5)

## 2015-03-25 LAB — CBC WITH DIFFERENTIAL/PLATELET
BASOS ABS: 0 10*3/uL (ref 0.0–0.1)
BASOS PCT: 0 % (ref 0–1)
Eosinophils Absolute: 0.1 10*3/uL (ref 0.0–0.7)
Eosinophils Relative: 2 % (ref 0–5)
HCT: 33 % — ABNORMAL LOW (ref 36.0–46.0)
Hemoglobin: 10 g/dL — ABNORMAL LOW (ref 12.0–15.0)
LYMPHS ABS: 0.9 10*3/uL (ref 0.7–4.0)
Lymphocytes Relative: 14 % (ref 12–46)
MCH: 25.7 pg — AB (ref 26.0–34.0)
MCHC: 30.3 g/dL (ref 30.0–36.0)
MCV: 84.8 fL (ref 78.0–100.0)
Monocytes Absolute: 0.4 10*3/uL (ref 0.1–1.0)
Monocytes Relative: 6 % (ref 3–12)
NEUTROS ABS: 5 10*3/uL (ref 1.7–7.7)
Neutrophils Relative %: 78 % — ABNORMAL HIGH (ref 43–77)
Platelets: 200 10*3/uL (ref 150–400)
RBC: 3.89 MIL/uL (ref 3.87–5.11)
RDW: 14.5 % (ref 11.5–15.5)
WBC: 6.4 10*3/uL (ref 4.0–10.5)

## 2015-03-25 LAB — BASIC METABOLIC PANEL
Anion gap: 10 (ref 5–15)
BUN: 18 mg/dL (ref 6–23)
CHLORIDE: 95 mmol/L — AB (ref 96–112)
CO2: 37 mmol/L — AB (ref 19–32)
CREATININE: 0.63 mg/dL (ref 0.50–1.10)
Calcium: 9.6 mg/dL (ref 8.4–10.5)
GFR calc non Af Amer: 89 mL/min — ABNORMAL LOW (ref >90)
GLUCOSE: 85 mg/dL (ref 70–99)
Potassium: 4 mmol/L (ref 3.5–5.1)
Sodium: 142 mmol/L (ref 135–145)

## 2015-10-17 IMAGING — CR DG CHEST 1V PORT
1 series · 1 of 1 positions shown · non-contrast
Comparison: None.

CLINICAL DATA: Short of breath.

EXAM:
PORTABLE CHEST - 1 VIEW

[AP]
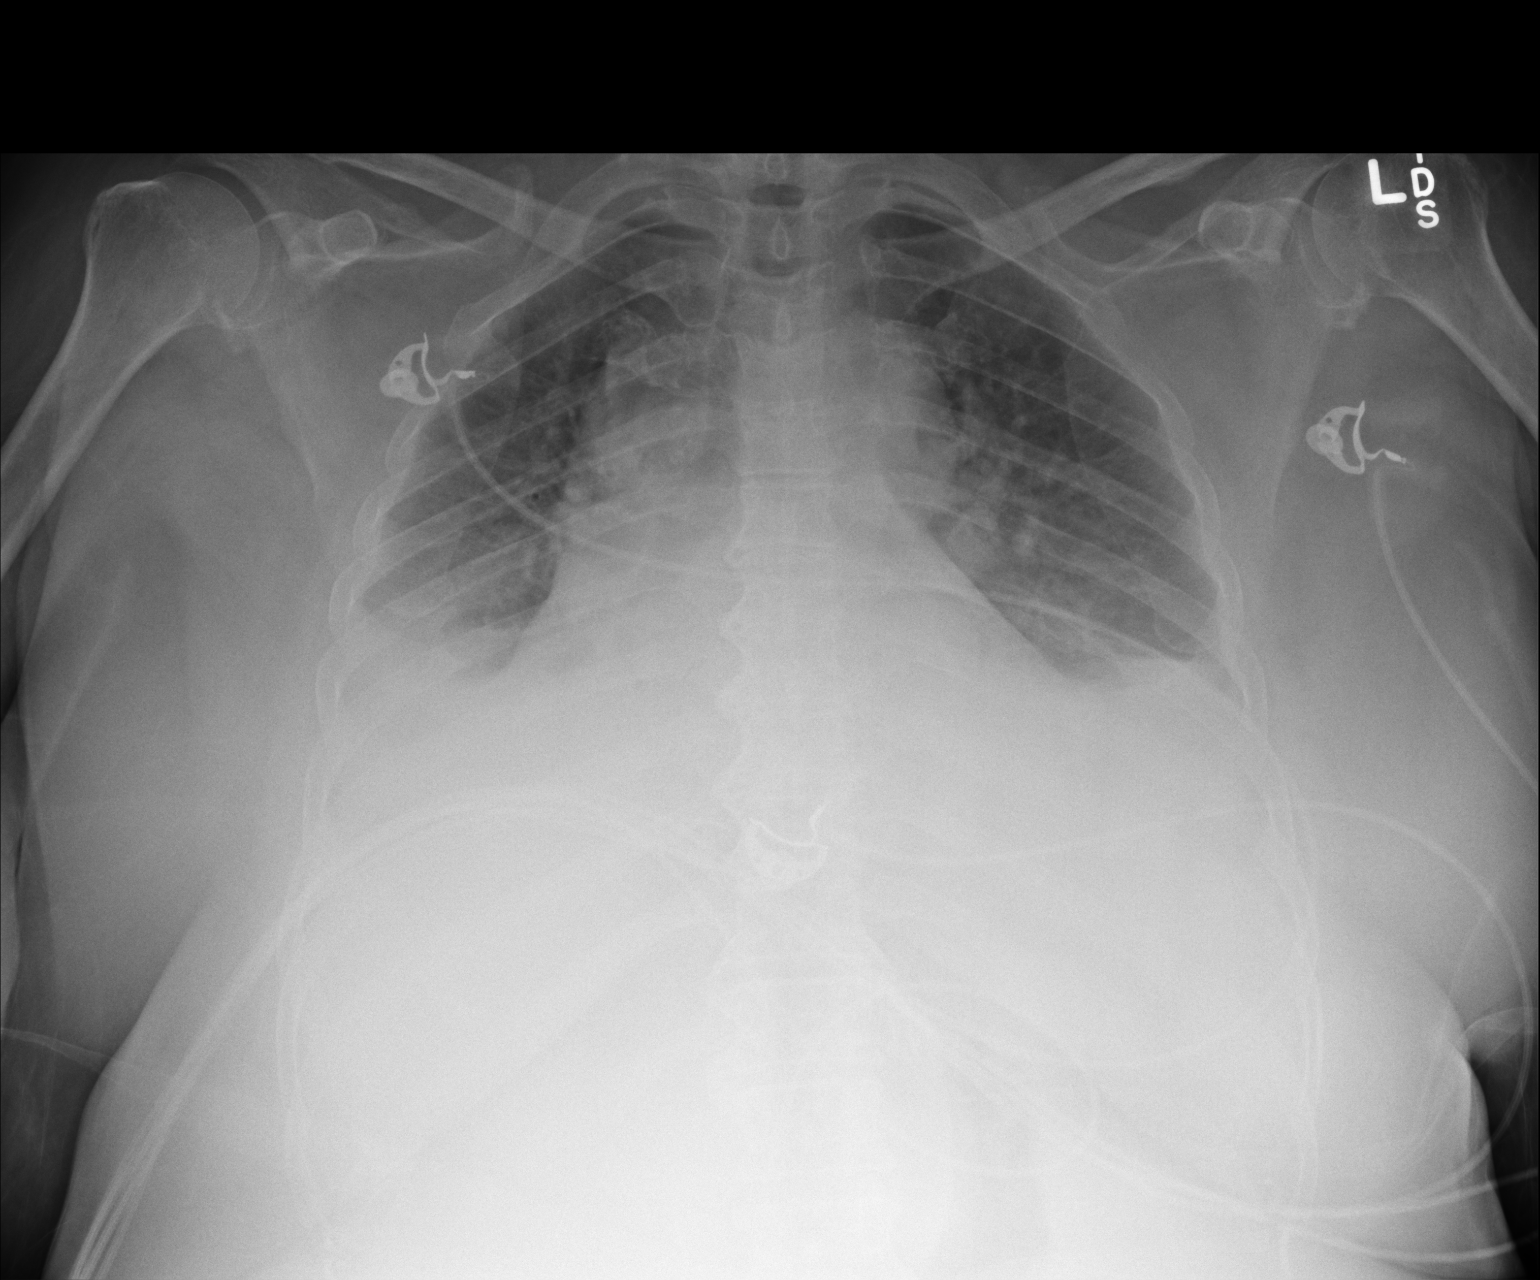

[1 of 1 positions shown; findings below may reference images not displayed]

FINDINGS: The low lung volumes. Opacity at both lung bases obscures
hemidiaphragms and portions of the heart borders. Lungs are
otherwise clear. Cardiac silhouette is partly obscured but appears
mildly enlarged. No mediastinal or hilar masses.

Bony thorax is demineralized but intact.
IMPRESSION: Bibasilar lung opacity consistent with bilateral effusions and
atelectasis. Infiltrate is possible. No overt pulmonary edema.

## 2015-10-21 IMAGING — CR DG CHEST 1V PORT
1 series · 1 of 1 positions shown · non-contrast
Comparison: DG CHEST 1V PORT dated 04/07/2014

CLINICAL DATA: Respiratory failure

EXAM:
PORTABLE CHEST - 1 VIEW

[AP]
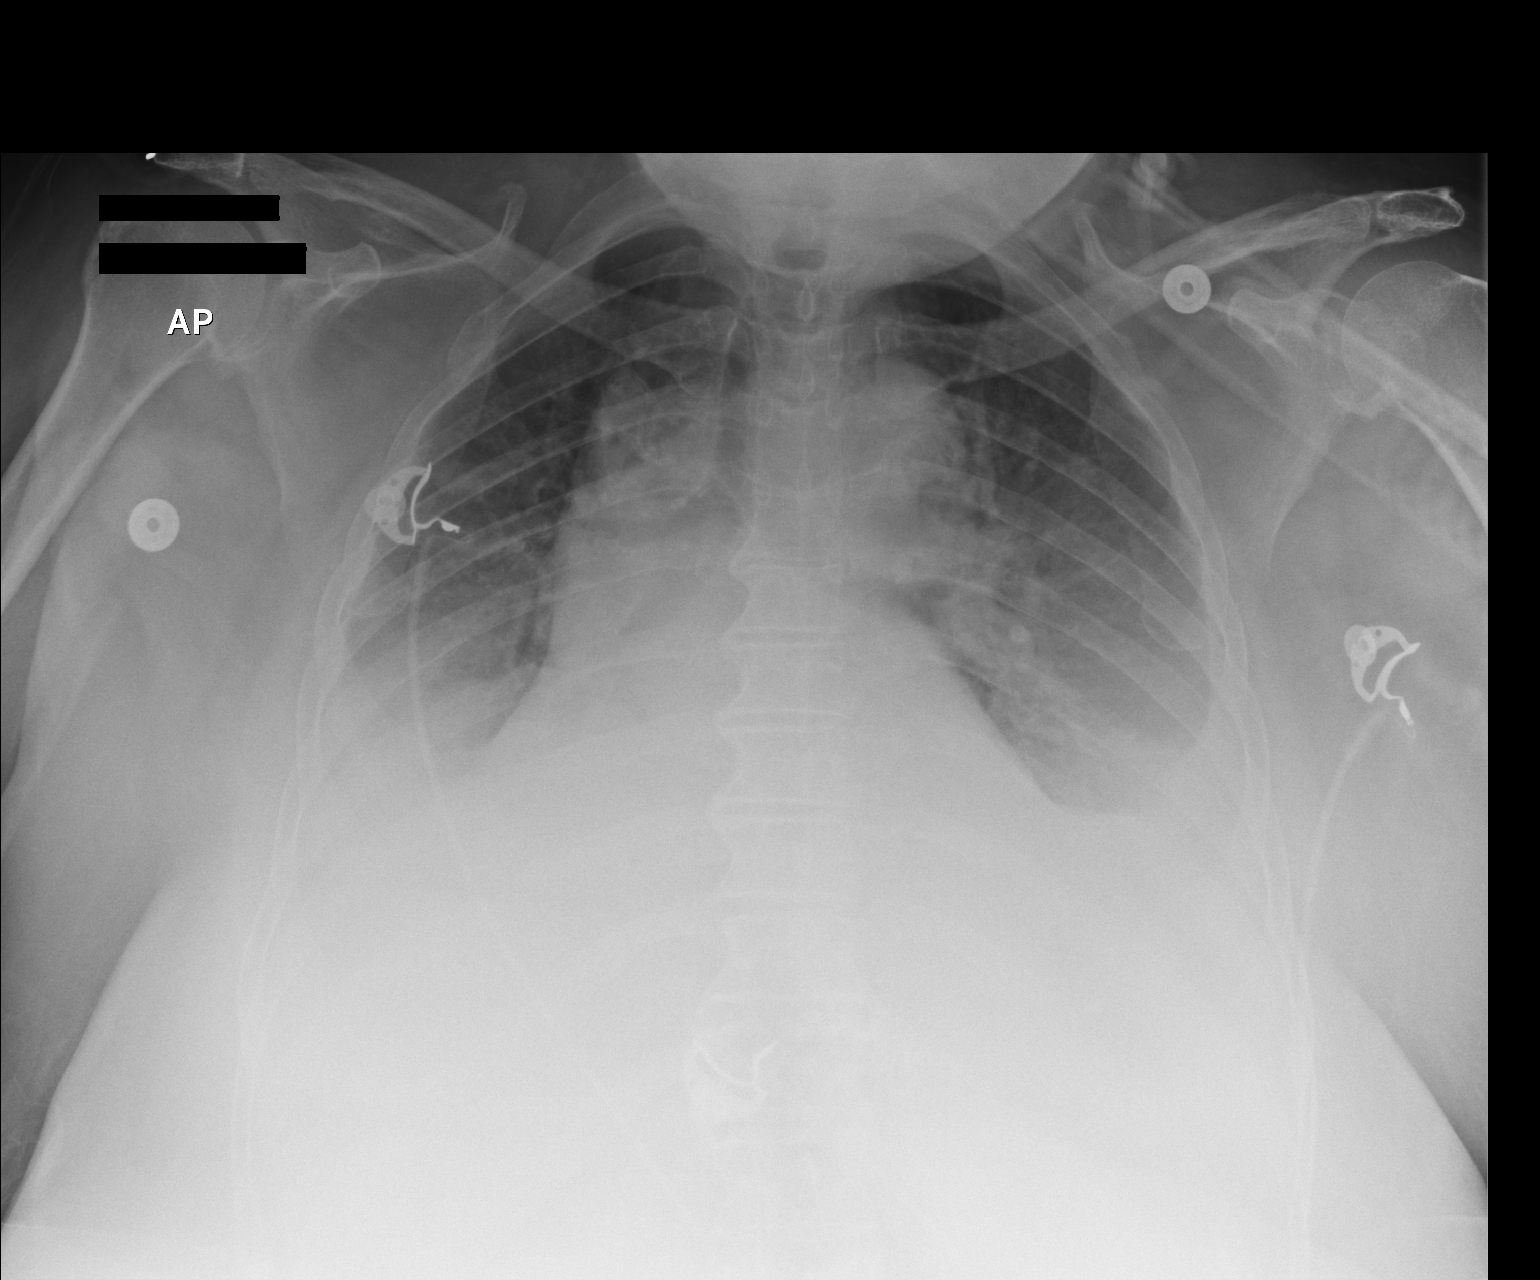

[1 of 1 positions shown; findings below may reference images not displayed]

FINDINGS: Low lung volumes. The cardiac silhouette is enlarged. Stable
opacities within the lung bases. There is blunting of the
costophrenic angles. Degenerative changes are appreciated within the
shoulders.
IMPRESSION: Stable small bilateral effusions and bibasilar bibasilar
atelectasis.

No overt pulmonary edema.

## 2015-10-22 IMAGING — CR DG CHEST 1V PORT
1 series · 1 of 1 positions shown · non-contrast
Comparison: 04/11/2014 and earlier.

CLINICAL DATA: 67-year-old female respiratory failure. Initial
encounter.

EXAM:
PORTABLE CHEST - 1 VIEW

[AP]
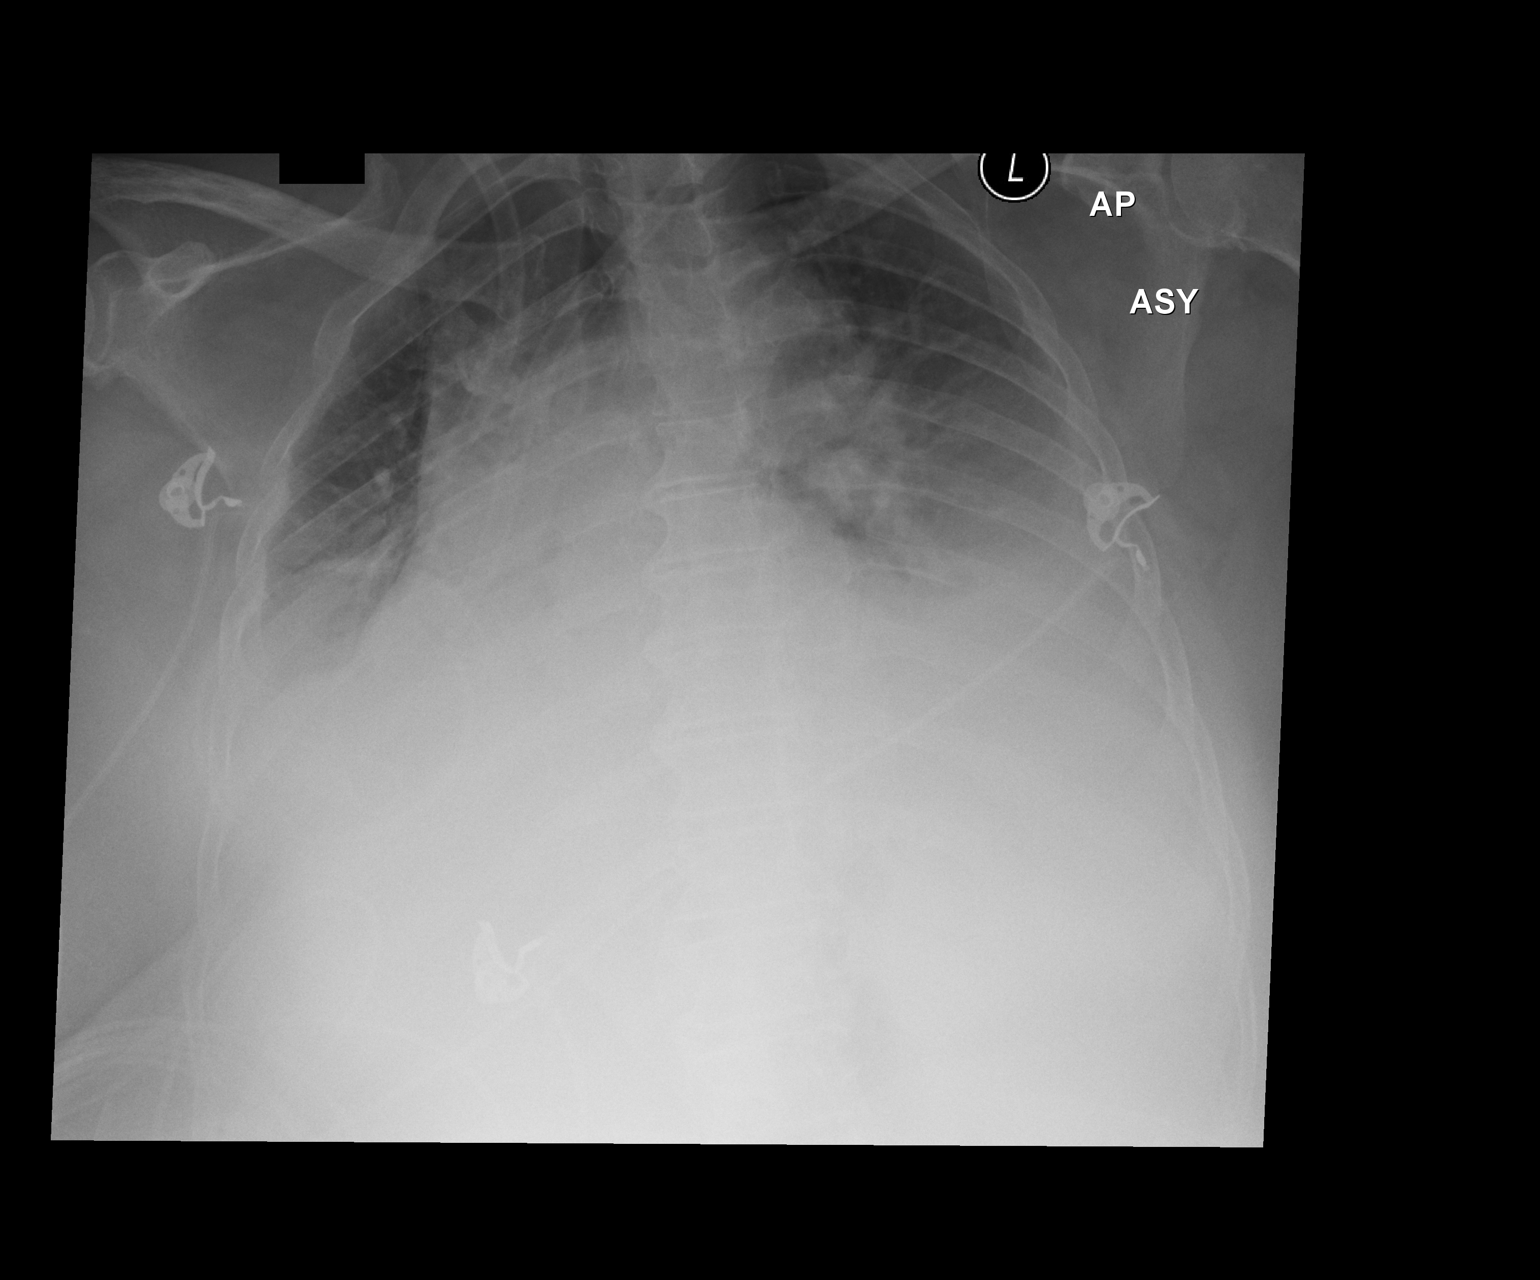

[1 of 1 positions shown; findings below may reference images not displayed]

FINDINGS: Portable AP semi upright view at at 0424 hrs. The patient is now
more rotated to the right. Continued veiling bilateral pulmonary
opacity with dense bibasilar opacity. No pneumothorax. No
superimposed overt edema. Stable visualized mediastinal contours.
Visualized tracheal air column is within normal limits.
IMPRESSION: Moderate to large bilateral pleural effusions with lower lobe
collapse or consolidation. No significant interval change.

## 2015-10-29 IMAGING — DX DG CHEST 1V PORT
1 series · 1 of 1 positions shown · non-contrast
Comparison: Portable chest x-ray of 04/17/2014

CLINICAL DATA: Pneumonia, effusion, follow-up

EXAM:
PORTABLE CHEST - 1 VIEW

[portable]
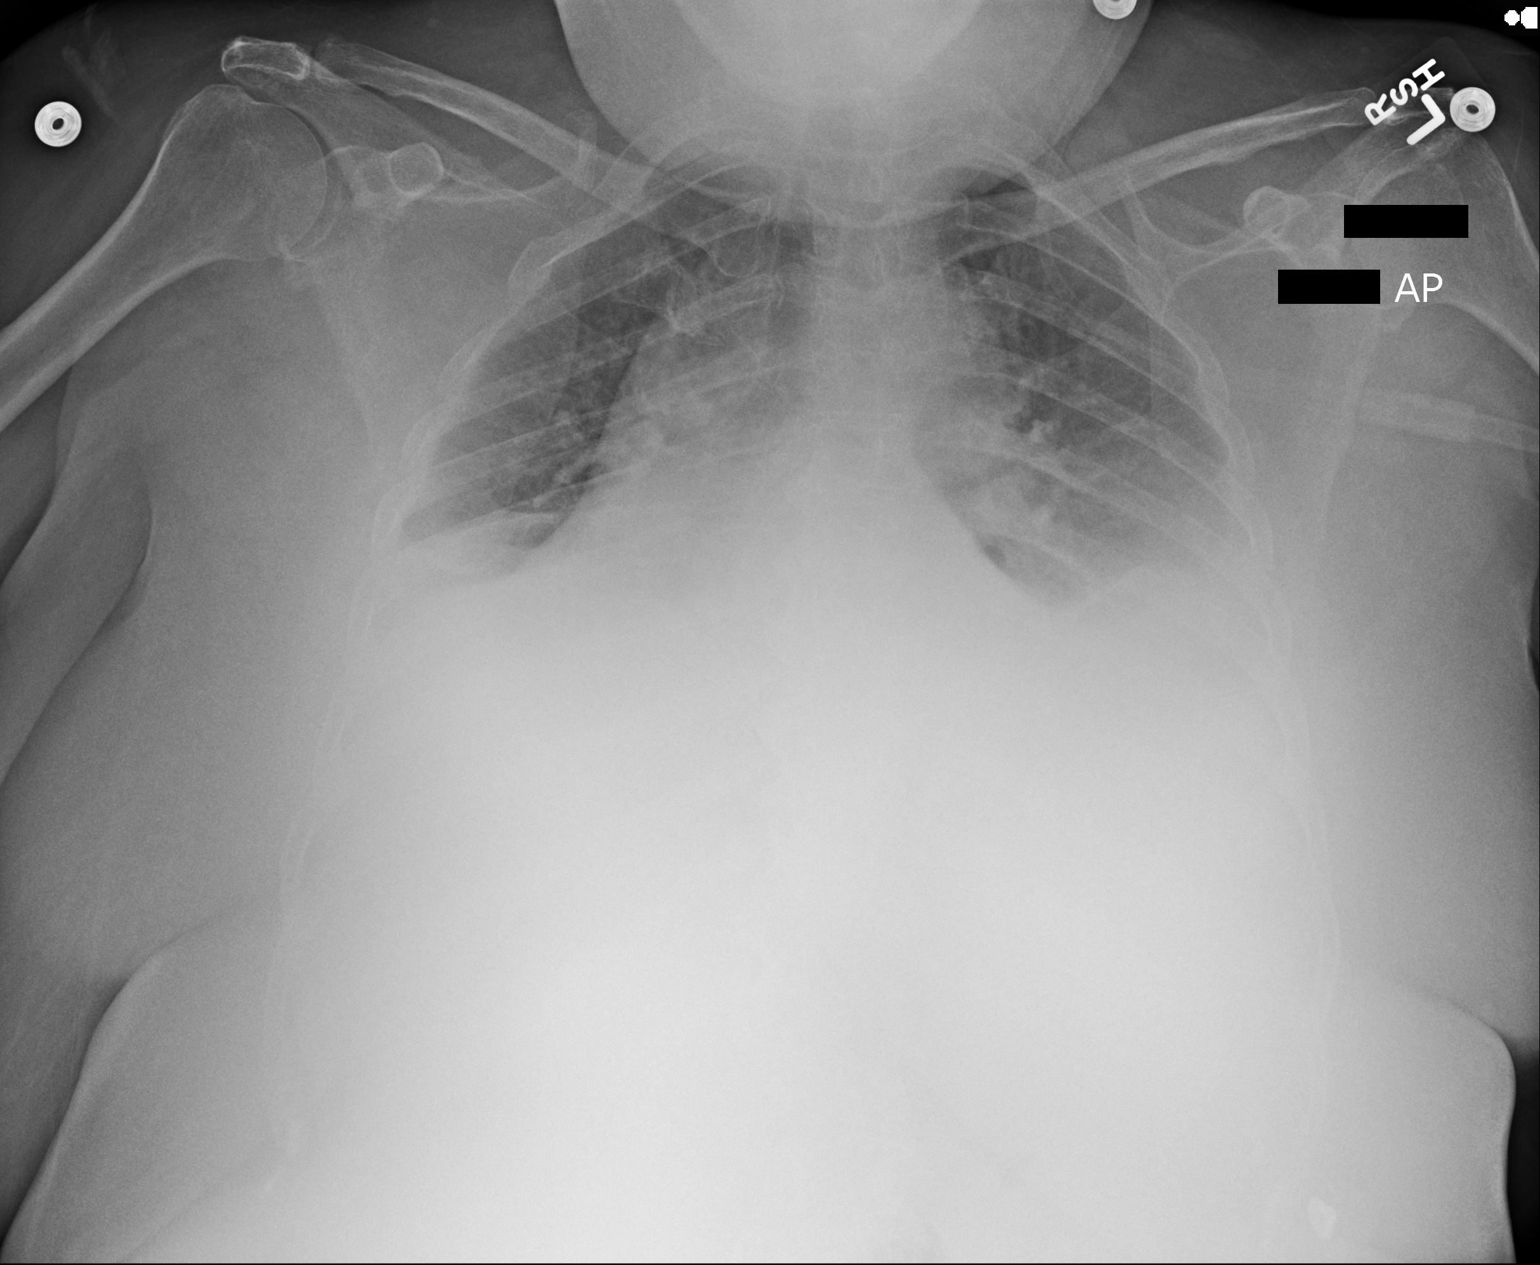

[1 of 1 positions shown; findings below may reference images not displayed]

FINDINGS: The lungs remain very poorly aerated. Bibasilar opacities remain
consistent with atelectasis and effusions. Pneumonia cannot be
excluded. There may be minimal pulmonary vascular congestion present
and cardiomegaly is relatively stable.
IMPRESSION: No significant change in poor aeration with opacities at both lung
bases most consistent with atelectasis and effusions. Probable mild
pulmonary vascular congestion with cardiomegaly.

## 2018-05-19 ENCOUNTER — Ambulatory Visit (HOSPITAL_COMMUNITY)
Admission: AD | Admit: 2018-05-19 | Discharge: 2018-05-19 | Disposition: A | Discharge status: Home / Self Care | Payer: Medicare Other | Source: Other Acute Inpatient Hospital | Attending: Internal Medicine | Admitting: Internal Medicine

## 2018-05-19 ENCOUNTER — Other Ambulatory Visit (HOSPITAL_COMMUNITY): Payer: Self-pay

## 2018-05-19 ENCOUNTER — Inpatient Hospital Stay (HOSPITAL_COMMUNITY): Admit: 2018-05-19 | Payer: Self-pay

## 2018-05-19 ENCOUNTER — Inpatient Hospital Stay
Admission: RE | Admit: 2018-05-19 | Discharge: 2018-05-27 | Disposition: A | Discharge status: Other Acute Inpatient Hospital | Payer: Medicare Other | Source: Ambulatory Visit | Attending: Internal Medicine | Admitting: Internal Medicine

## 2018-05-19 DIAGNOSIS — J9690 Respiratory failure, unspecified, unspecified whether with hypoxia or hypercapnia: Secondary | ICD-10-CM | POA: Insufficient documentation

## 2018-05-19 DIAGNOSIS — Z4659 Encounter for fitting and adjustment of other gastrointestinal appliance and device: Secondary | ICD-10-CM

## 2018-05-19 DIAGNOSIS — J9602 Acute respiratory failure with hypercapnia: Principal | ICD-10-CM

## 2018-05-19 DIAGNOSIS — J9601 Acute respiratory failure with hypoxia: Secondary | ICD-10-CM | POA: Diagnosis present

## 2018-05-19 DIAGNOSIS — Z0189 Encounter for other specified special examinations: Secondary | ICD-10-CM

## 2018-05-19 DIAGNOSIS — J189 Pneumonia, unspecified organism: Secondary | ICD-10-CM | POA: Diagnosis present

## 2018-05-19 DIAGNOSIS — J449 Chronic obstructive pulmonary disease, unspecified: Secondary | ICD-10-CM | POA: Diagnosis present

## 2018-05-20 LAB — COMPREHENSIVE METABOLIC PANEL
ALT: 21 U/L (ref 14–54)
AST: 17 U/L (ref 15–41)
Albumin: 2.9 g/dL — ABNORMAL LOW (ref 3.5–5.0)
Alkaline Phosphatase: 43 U/L (ref 38–126)
Anion gap: 5 (ref 5–15)
BUN: 17 mg/dL (ref 6–20)
CO2: 28 mmol/L (ref 22–32)
Calcium: 8.8 mg/dL — ABNORMAL LOW (ref 8.9–10.3)
Chloride: 106 mmol/L (ref 101–111)
Creatinine, Ser: 0.56 mg/dL (ref 0.44–1.00)
GFR calc non Af Amer: >60 mL/min (ref >60)
Glucose, Bld: 122 mg/dL — ABNORMAL HIGH (ref 65–99)
Potassium: 4.6 mmol/L (ref 3.5–5.1)
SODIUM: 139 mmol/L (ref 135–145)
Total Bilirubin: 0.4 mg/dL (ref 0.3–1.2)
Total Protein: 5.3 g/dL — ABNORMAL LOW (ref 6.5–8.1)

## 2018-05-20 LAB — CBC
HCT: 29.9 % — ABNORMAL LOW (ref 36.0–46.0)
HEMOGLOBIN: 9.4 g/dL — AB (ref 12.0–15.0)
MCH: 27.2 pg (ref 26.0–34.0)
MCHC: 31.4 g/dL (ref 30.0–36.0)
MCV: 86.4 fL (ref 78.0–100.0)
Platelets: 223 10*3/uL (ref 150–400)
RBC: 3.46 MIL/uL — AB (ref 3.87–5.11)
RDW: 15.5 % (ref 11.5–15.5)
WBC: 12.6 10*3/uL — ABNORMAL HIGH (ref 4.0–10.5)

## 2018-05-20 LAB — PROTIME-INR
INR: 0.98
Prothrombin Time: 12.9 seconds (ref 11.4–15.2)

## 2018-05-21 ENCOUNTER — Other Ambulatory Visit (HOSPITAL_COMMUNITY): Payer: Self-pay

## 2018-05-21 DIAGNOSIS — J438 Other emphysema: Secondary | ICD-10-CM | POA: Diagnosis not present

## 2018-05-21 DIAGNOSIS — J189 Pneumonia, unspecified organism: Secondary | ICD-10-CM

## 2018-05-21 DIAGNOSIS — J9602 Acute respiratory failure with hypercapnia: Secondary | ICD-10-CM

## 2018-05-21 DIAGNOSIS — J9601 Acute respiratory failure with hypoxia: Secondary | ICD-10-CM | POA: Diagnosis not present

## 2018-05-21 NOTE — Progress Notes (Signed)
Pulmonary [place]   PULMONARY SERVICE  PROGRESS NOTE  Date of Service: 05/21/2018  Debbie Pena  XBD:[id number]  DOB: 09/17/1946   DOA: 05/19/2018  Referring Physician: Merton Border, MD  HPI: Debbie Pena is a 72 y.o. female seen for follow up of Acute on Chronic Respiratory Failure.  Patient remains on full vent support this morning.  Was weaning on pressure support was able to accomplish 6 hours before placing back on assist control.  Right now is comfortable without distress.  Medications: Reviewed on Rounds  Physical Exam:  Vitals: Temperature 98.9 pulse 97 respiratory 14 blood pressure 152/78 saturations 100%  Ventilator Settings mode of ventilation assist control FiO2 28% tidal volume 415 PEEP 5  . General: Comfortable at this time . Eyes: Grossly normal lids, irises & conjunctiva . ENT: grossly tongue is normal . Neck: no obvious mass . Cardiovascular: S1 S2 normal no gallop . Respiratory: Coarse breath sounds no rhonchi . Abdomen: soft . Skin: no rash seen on limited exam . Musculoskeletal: not rigid . Psychiatric:unable to assess . Neurologic: no seizure no involuntary movements         Lab Data:   Basic Metabolic Panel: Recent Labs  Lab 05/20/18 0620  NA 139  K 4.6  CL 106  CO2 28  GLUCOSE 122*  BUN 17  CREATININE 0.56  CALCIUM 8.8*    Liver Function Tests: Recent Labs  Lab 05/20/18 0620  AST 17  ALT 21  ALKPHOS 43  BILITOT 0.4  PROT 5.3*  ALBUMIN 2.9*   No results for input(s): LIPASE, AMYLASE in the last 168 hours. No results for input(s): AMMONIA in the last 168 hours.  CBC: Recent Labs  Lab 05/20/18 0620  WBC 12.6*  HGB 9.4*  HCT 29.9*  MCV 86.4  PLT 223    Cardiac Enzymes: No results for input(s): CKTOTAL, CKMB, CKMBINDEX, TROPONINI in the last 168 hours.  BNP (last 3 results) No results for input(s): BNP in the last 8760 hours.  ProBNP (last 3 results) No results for  input(s): PROBNP in the last 8760 hours.  Radiological Exams: Dg Chest Port 1 View  Result Date: 05/19/2018 CLINICAL DATA:  Evaluate ETT placement EXAM: PORTABLE CHEST 1 VIEW COMPARISON:  March 16, 2015 FINDINGS: An ET tube terminates in the mid trachea in good position. The OG tube terminates below today's film. No pneumothorax. No nodules or masses. The cardiomediastinal silhouette is stable. Bilateral small pleural effusions with underlying atelectasis remain. IMPRESSION: 1. Support apparatus as above. 2. Small bilateral pleural effusions with underlying atelectasis. 3. No other changes. Electronically Signed   By: Dorise Bullion III M.D   On: 05/19/2018 17:37   Dg Abd Portable 1v  Result Date: 05/19/2018 CLINICAL DATA:  Evaluate OG tube placement EXAM: PORTABLE ABDOMEN - 1 VIEW COMPARISON:  None. FINDINGS: The OG tube terminates at the midline of the inferior abdomen. Small bilateral effusions with underlying atelectasis. No other abnormalities. IMPRESSION: The OG tube terminates in the inferior abdomen at midline, likely within the distal aspect of a J-shaped stomach. Electronically Signed   By: Dorise Bullion III M.D   On: 05/19/2018 17:36    Assessment/Plan Principal Problem:   Acute respiratory failure with hypoxia and hypercarbia (HCC) Active Problems:   COPD (chronic obstructive pulmonary disease) ([place])   HCAP (healthcare-associated pneumonia)   1. Acute on chronic respiratory failure with hypoxia we will continue with weaning protocol patient is tolerating the pressure support wean  we will continue to advance as tolerated.  Continue pulmonary toilet secretion management overall patient prognosis remains guarded. 2. COPD apparent severe disease we will continue present management 3. Healthcare associated pneumonia treated with antibiotics follow-up x-rays as necessary.  The last chest x-ray showed some small effusions and underlying atelectasis will continue aggressive pulmonary  toilet   I have personally seen and evaluated the patient, evaluated laboratory and imaging results, formulated the assessment and plan and placed orders. The Patient requires high complexity decision making for assessment and support.  Case was discussed on Rounds with the Respiratory Therapy Staff  Allyne Gee, MD [facility] Pulmonary Critical Care Medicine Sleep Medicine

## 2018-05-23 ENCOUNTER — Other Ambulatory Visit (HOSPITAL_COMMUNITY): Payer: Self-pay

## 2018-05-23 DIAGNOSIS — J438 Other emphysema: Secondary | ICD-10-CM | POA: Diagnosis not present

## 2018-05-23 DIAGNOSIS — J189 Pneumonia, unspecified organism: Secondary | ICD-10-CM | POA: Diagnosis not present

## 2018-05-23 DIAGNOSIS — J9602 Acute respiratory failure with hypercapnia: Secondary | ICD-10-CM | POA: Diagnosis not present

## 2018-05-23 DIAGNOSIS — J9601 Acute respiratory failure with hypoxia: Secondary | ICD-10-CM | POA: Diagnosis not present

## 2018-05-23 NOTE — Progress Notes (Signed)
Pulmonary [place]   PULMONARY SERVICE  PROGRESS NOTE  Date of Service: 05/23/2018  Debbie Pena  WJX:[id number]  DOB: 1946-04-29   DOA: 05/19/2018  Referring Physician: Merton Border, MD  HPI: Debbie Pena is a 72 y.o. female seen for follow up of Acute on Chronic Respiratory Failure.  Patient is being followed for ongoing vent dependence.  Patient remains on assist control mode her weaning has been somewhat tenuous.  So I recommended getting an ENT consultation for possible tracheostomy.  Right house on full support  Medications: Reviewed on Rounds  Physical Exam:  Vitals: Temperature 97.4 pulse 96 respiratory 28 blood pressure 139/61 saturations 100%  Ventilator Settings mode of ventilation assist control FiO2 28% tidal volume 414 PEEP 5  . General: Comfortable at this time . Eyes: Grossly normal lids, irises & conjunctiva . ENT: grossly tongue is normal . Neck: no obvious mass . Cardiovascular: S1 S2 normal no gallop . Respiratory: No rhonchi or rales are noted . Abdomen: soft . Skin: no rash seen on limited exam . Musculoskeletal: not rigid . Psychiatric:unable to assess . Neurologic: no seizure no involuntary movements         Lab Data:   Basic Metabolic Panel: Recent Labs  Lab 05/20/18 0620  NA 139  K 4.6  CL 106  CO2 28  GLUCOSE 122*  BUN 17  CREATININE 0.56  CALCIUM 8.8*    Liver Function Tests: Recent Labs  Lab 05/20/18 0620  AST 17  ALT 21  ALKPHOS 43  BILITOT 0.4  PROT 5.3*  ALBUMIN 2.9*   No results for input(s): LIPASE, AMYLASE in the last 168 hours. No results for input(s): AMMONIA in the last 168 hours.  CBC: Recent Labs  Lab 05/20/18 0620  WBC 12.6*  HGB 9.4*  HCT 29.9*  MCV 86.4  PLT 223    Cardiac Enzymes: No results for input(s): CKTOTAL, CKMB, CKMBINDEX, TROPONINI in the last 168 hours.  BNP (last 3 results) No results for input(s): BNP in the last 8760  hours.  ProBNP (last 3 results) No results for input(s): PROBNP in the last 8760 hours.  Radiological Exams: Dg Chest Port 1 View  Result Date: 05/23/2018 CLINICAL DATA:  Respiratory failure EXAM: PORTABLE CHEST 1 VIEW COMPARISON:  Four days ago FINDINGS: Endotracheal tube tip just below the clavicular heads. An orogastric tube reaches the stomach at least. Low volume chest with streaky opacities. Interface over the lower lateral left chest best attributed to skin fold. Normal heart size accounting for technique. IMPRESSION: 1. Low volume chest with atelectasis. 2. Stable tube positioning. Electronically Signed   By: Monte Fantasia M.D.   On: 05/23/2018 07:24   Dg Abd Portable 1v  Result Date: 05/21/2018 CLINICAL DATA:  Orogastric tube placement. EXAM: PORTABLE ABDOMEN - 1 VIEW COMPARISON:  Abdominal radiograph May 19, 2018 FINDINGS: Nasogastric tube tip projects in gastric antrum. Included bowel gas pattern is nondilated and nonobstructive. Probable temperature probe projecting in the pelvis. No intra-abdominal mass effect or pathologic calcifications. Osteopenia. Old mild lumbar spine compression fractures. IMPRESSION: Nasogastric tube tip projecting in the gastric antrum. Normal bowel gas pattern. Electronically Signed   By: Elon Alas M.D.   On: 05/21/2018 22:46    Assessment/Plan Principal Problem:   Acute respiratory failure with hypoxia and hypercarbia (HCC) Active Problems:   COPD (chronic obstructive pulmonary disease) (HCC)   HCAP (healthcare-associated pneumonia)   1. Acute on chronic respiratory failure with hypoxia we will  continue with full vent support at this time.  Check the our SBI.  Also has recommended getting ENT consultation for tracheostomy. 2. COPD severe disease continue with present management overall prognosis is guarded. 3. Healthcare associated pneumonia treated with antibiotics we will continue with supportive care last chest x-ray showed low volumes  and some dependent atelectasis.   I have personally seen and evaluated the patient, evaluated laboratory and imaging results, formulated the assessment and plan and placed orders. The Patient requires high complexity decision making for assessment and support.  Case was discussed on Rounds with the Respiratory Therapy Staff  Allyne Gee, MD [facility] Pulmonary Critical Care Medicine Sleep Medicine

## 2018-05-25 LAB — BASIC METABOLIC PANEL
Anion gap: 6 (ref 5–15)
BUN: 29 mg/dL — AB (ref 6–20)
CO2: 24 mmol/L (ref 22–32)
Calcium: 8.7 mg/dL — ABNORMAL LOW (ref 8.9–10.3)
Chloride: 111 mmol/L (ref 101–111)
Creatinine, Ser: 0.54 mg/dL (ref 0.44–1.00)
GFR calc Af Amer: >60 mL/min (ref >60)
GLUCOSE: 159 mg/dL — AB (ref 65–99)
POTASSIUM: 4.3 mmol/L (ref 3.5–5.1)
Sodium: 141 mmol/L (ref 135–145)

## 2018-05-25 LAB — CBC
HEMATOCRIT: 28.3 % — AB (ref 36.0–46.0)
Hemoglobin: 8.9 g/dL — ABNORMAL LOW (ref 12.0–15.0)
MCH: 27.4 pg (ref 26.0–34.0)
MCHC: 31.4 g/dL (ref 30.0–36.0)
MCV: 87.1 fL (ref 78.0–100.0)
Platelets: 212 10*3/uL (ref 150–400)
RBC: 3.25 MIL/uL — ABNORMAL LOW (ref 3.87–5.11)
RDW: 15.8 % — AB (ref 11.5–15.5)
WBC: 9.9 10*3/uL (ref 4.0–10.5)

## 2018-05-27 ENCOUNTER — Inpatient Hospital Stay (HOSPITAL_COMMUNITY)
Admission: RE | Admit: 2018-05-27 | Discharge: 2018-06-27 | Disposition: A | Discharge status: Skilled Nursing Facility | Payer: Medicare Other | Source: Ambulatory Visit | Attending: Internal Medicine | Admitting: Internal Medicine

## 2018-05-27 ENCOUNTER — Encounter (HOSPITAL_COMMUNITY)
Admission: RE | Disposition: A | Discharge status: Nursing Home (Includes ICF) | Payer: Self-pay | Source: Ambulatory Visit | Attending: Internal Medicine

## 2018-05-27 ENCOUNTER — Encounter (HOSPITAL_COMMUNITY): Payer: Self-pay | Admitting: Certified Registered"

## 2018-05-27 ENCOUNTER — Other Ambulatory Visit (HOSPITAL_COMMUNITY): Payer: Self-pay

## 2018-05-27 ENCOUNTER — Encounter: Payer: Self-pay | Admitting: Certified Registered"

## 2018-05-27 DIAGNOSIS — J9601 Acute respiratory failure with hypoxia: Secondary | ICD-10-CM | POA: Diagnosis present

## 2018-05-27 DIAGNOSIS — Z930 Tracheostomy status: Secondary | ICD-10-CM

## 2018-05-27 DIAGNOSIS — Z4659 Encounter for fitting and adjustment of other gastrointestinal appliance and device: Secondary | ICD-10-CM

## 2018-05-27 DIAGNOSIS — J9602 Acute respiratory failure with hypercapnia: Secondary | ICD-10-CM

## 2018-05-27 DIAGNOSIS — J9690 Respiratory failure, unspecified, unspecified whether with hypoxia or hypercapnia: Secondary | ICD-10-CM

## 2018-05-27 DIAGNOSIS — J189 Pneumonia, unspecified organism: Secondary | ICD-10-CM | POA: Diagnosis present

## 2018-05-27 DIAGNOSIS — J449 Chronic obstructive pulmonary disease, unspecified: Secondary | ICD-10-CM | POA: Diagnosis present

## 2018-05-27 DIAGNOSIS — G4733 Obstructive sleep apnea (adult) (pediatric): Secondary | ICD-10-CM | POA: Diagnosis present

## 2018-05-27 HISTORY — PX: TRACHEOSTOMY TUBE PLACEMENT: SHX814

## 2018-05-27 SURGERY — CREATION, TRACHEOSTOMY
Anesthesia: General | Site: Neck

## 2018-05-27 SURGERY — CREATION, TRACHEOSTOMY
Anesthesia: General

## 2018-05-27 MED ORDER — MIDAZOLAM HCL 5 MG/5ML IJ SOLN
INTRAMUSCULAR | Status: DC | PRN
  Administered 2018-05-27: 2 mg via INTRAVENOUS

## 2018-05-27 MED ORDER — 0.9 % SODIUM CHLORIDE (POUR BTL) OPTIME
TOPICAL | Status: DC | PRN
  Administered 2018-05-27: 1000 mL

## 2018-05-27 MED ORDER — LIDOCAINE-EPINEPHRINE 1 %-1:100000 IJ SOLN
INTRAMUSCULAR | Status: DC | PRN
  Administered 2018-05-27: 20 mL

## 2018-05-27 MED ORDER — FENTANYL CITRATE (PF) 250 MCG/5ML IJ SOLN
INTRAMUSCULAR | Status: AC
  Filled 2018-05-27: qty 5

## 2018-05-27 MED ORDER — STERILE WATER FOR IRRIGATION IR SOLN
Status: DC | PRN
  Administered 2018-05-27: 1000 mL

## 2018-05-27 MED ORDER — LACTATED RINGERS IV SOLN
INTRAVENOUS | Status: DC | PRN
  Administered 2018-05-27: 10:00:00 via INTRAVENOUS

## 2018-05-27 MED ORDER — MIDAZOLAM HCL 2 MG/2ML IJ SOLN
INTRAMUSCULAR | Status: AC
  Filled 2018-05-27: qty 2

## 2018-05-27 MED ORDER — FENTANYL CITRATE (PF) 100 MCG/2ML IJ SOLN
INTRAMUSCULAR | Status: DC | PRN
  Administered 2018-05-27: 50 ug via INTRAVENOUS

## 2018-05-27 SURGICAL SUPPLY — 41 items
ATTRACTOMAT 16X20 MAGNETIC DRP (DRAPES) IMPLANT
BLADE SURG 15 STRL LF DISP TIS (BLADE) ×1 IMPLANT
BLADE SURG 15 STRL SS (BLADE) ×3
CLEANER TIP ELECTROSURG 2X2 (MISCELLANEOUS) ×3 IMPLANT
COVER SURGICAL LIGHT HANDLE (MISCELLANEOUS) ×3 IMPLANT
DRAPE HALF SHEET 40X57 (DRAPES) IMPLANT
ELECT COATED BLADE 2.86 ST (ELECTRODE) ×3 IMPLANT
ELECT REM PT RETURN 9FT ADLT (ELECTROSURGICAL) ×3
ELECTRODE REM PT RTRN 9FT ADLT (ELECTROSURGICAL) ×1 IMPLANT
GAUZE SPONGE 4X4 16PLY XRAY LF (GAUZE/BANDAGES/DRESSINGS) ×3 IMPLANT
GEL ULTRASOUND 20GR AQUASONIC (MISCELLANEOUS) ×3 IMPLANT
GLOVE BIO SURGEON STRL SZ 6.5 (GLOVE) ×1 IMPLANT
GLOVE BIO SURGEONS STRL SZ 6.5 (GLOVE) ×1
GLOVE SS BIOGEL STRL SZ 7.5 (GLOVE) ×1 IMPLANT
GLOVE SUPERSENSE BIOGEL SZ 7.5 (GLOVE) ×2
GOWN STRL REUS W/ TWL LRG LVL3 (GOWN DISPOSABLE) ×1 IMPLANT
GOWN STRL REUS W/ TWL XL LVL3 (GOWN DISPOSABLE) ×1 IMPLANT
GOWN STRL REUS W/TWL LRG LVL3 (GOWN DISPOSABLE) ×3
GOWN STRL REUS W/TWL XL LVL3 (GOWN DISPOSABLE) ×3
HOLDER TRACH TUBE VELCRO 19.5 (MISCELLANEOUS) ×3 IMPLANT
KIT BASIN OR (CUSTOM PROCEDURE TRAY) ×3 IMPLANT
KIT SUCTION CATH 14FR (SUCTIONS) ×3 IMPLANT
KIT TURNOVER KIT B (KITS) ×3 IMPLANT
NEEDLE HYPO 25GX1X1/2 BEV (NEEDLE) ×3 IMPLANT
NS IRRIG 1000ML POUR BTL (IV SOLUTION) ×3 IMPLANT
PACK EENT II TURBAN DRAPE (CUSTOM PROCEDURE TRAY) ×3 IMPLANT
PAD ARMBOARD 7.5X6 YLW CONV (MISCELLANEOUS) IMPLANT
PENCIL BUTTON HOLSTER BLD 10FT (ELECTRODE) ×3 IMPLANT
SPONGE DRAIN TRACH 4X4 STRL 2S (GAUZE/BANDAGES/DRESSINGS) ×3 IMPLANT
SPONGE INTESTINAL PEANUT (DISPOSABLE) ×3 IMPLANT
SUT SILK 2 0 SH CR/8 (SUTURE) ×3 IMPLANT
SUT SILK 3 0 TIES 10X30 (SUTURE) IMPLANT
SYR 5ML LUER SLIP (SYRINGE) ×3 IMPLANT
SYR BULB IRRIGATION 50ML (SYRINGE) ×3 IMPLANT
SYR CONTROL 10ML LL (SYRINGE) ×3 IMPLANT
TOWEL OR 17X24 6PK STRL BLUE (TOWEL DISPOSABLE) ×3 IMPLANT
TOWEL OR 17X26 10 PK STRL BLUE (TOWEL DISPOSABLE) ×3 IMPLANT
TUBE CONNECTING 12'X1/4 (SUCTIONS) ×1
TUBE CONNECTING 12X1/4 (SUCTIONS) ×2 IMPLANT
TUBE TRACH SHILEY  6 DIST  CUF (TUBING) ×2 IMPLANT
TUBE TRACH SHILEY 8 DIST CUF (TUBING) IMPLANT

## 2018-05-27 NOTE — Transfer of Care (Signed)
Immediate Anesthesia Transfer of Care Note  Patient: Debbie Pena  Procedure(s) Performed: TRACHEOSTOMY (N/A Neck)  Patient Location: Nursing Unit  Anesthesia Type:General  Level of Consciousness: sedated  Airway & Oxygen Therapy: Patient placed on Ventilator (see vital sign flow sheet for setting) and Tracheostomy tube ventilated with Ambu bag from OR to 5E 22  Post-op Assessment: Report given to RN, Post -op Vital signs reviewed and stable and Patient moving all extremities  Post vital signs: Reviewed and stable  Last Vitals:  Vitals Value Taken Time  BP    Temp    Pulse    Resp    SpO2      Last Pain: There were no vitals filed for this visit.       Complications: No apparent anesthesia complications

## 2018-05-27 NOTE — Brief Op Note (Signed)
05/27/2018  10:40 AM  PATIENT:  Blondell Reveal  72 y.o. female  PRE-OPERATIVE DIAGNOSIS:  resp failure  POST-OPERATIVE DIAGNOSIS:  resp failure  PROCEDURE:  Procedure(s): TRACHEOSTOMY (N/A) # 6 Shiley  SURGEON:  Surgeon(s) and Role:    Rozetta Nunnery, MD - Primary  PHYSICIAN ASSISTANT:   ASSISTANTS: none   ANESTHESIA:   general  EBL:  5 mL   BLOOD ADMINISTERED:none  DRAINS: none   LOCAL MEDICATIONS USED:  XYLOCAINE with EPI 7 cc  SPECIMEN:  No Specimen  DISPOSITION OF SPECIMEN:  N/A  COUNTS:  YES  TOURNIQUET:  * No tourniquets in log *  DICTATION: .Other Dictation: Dictation Number [id number]  PLAN OF CARE: Discharge to home after PACU  PATIENT DISPOSITION:  PACU - hemodynamically stable.   Delay start of Pharmacological VTE agent (>24hrs) due to surgical blood loss or risk of bleeding: yes

## 2018-05-27 NOTE — H&P (Signed)
PREOPERATIVE H&P  Chief Complaint: Acute on chronic respiratory failure  HPI: Debbie Pena is a 72 y.o. female who presents for evaluation of acute on chronic respiratory failure for evaluation for tracheostomy. Debbie Pena is a nursing home patient with COPD, HTN, DM II, and OSA. She was hospitalized with pneumonia requiring intubation in [place]. She failed weaning and extubation and was subsequently transferred to [facility] on 6/13. Weaning has been unsuccessful and trach was consulted for on 6/18. She's taken to the OR for planned tracheostomy.  Past Medical History:  Diagnosis Date  . Chronic respiratory failure with hypoxia ([place])   . COPD (chronic obstructive pulmonary disease) ([place])   . GERD (gastroesophageal reflux disease)   . Hepatic cyst   . HTN (hypertension)   . Hyperlipidemia   . Obesity hypoventilation syndrome ([place])   . OSA (obstructive sleep apnea)   . Renal angiolipoma   . Schizoaffective disorder ([place])    History reviewed. No pertinent surgical history. Social History   Socioeconomic History  . Marital status: Single    Spouse name: Not on file  . Number of children: Not on file  . Years of education: Not on file  . Highest education level: Not on file  Occupational History  . Not on file  Social Needs  . Financial resource strain: Not on file  . Food insecurity:    Worry: Not on file    Inability: Not on file  . Transportation needs:    Medical: Not on file    Non-medical: Not on file  Tobacco Use  . Smoking status: Former Smoker  Substance and Sexual Activity  . Alcohol use: Yes  . Drug use: No  . Sexual activity: Not on file  Lifestyle  . Physical activity:    Days per week: Not on file    Minutes per session: Not on file  . Stress: Not on file  Relationships  . Social connections:    Talks on phone: Not on file    Gets together: Not on file    Attends religious service: Not on file    Active member of club or organization: Not on file   Attends meetings of clubs or organizations: Not on file    Relationship status: Not on file  Other Topics Concern  . Not on file  Social History Narrative  . Not on file   History reviewed. No pertinent family history. Allergies  Allergen Reactions  . Klonopin [Clonazepam]   . Penicillins Other (See Comments)   Prior to Admission medications   Medication Sig Start Date End Date Taking? Authorizing Provider  albuterol (PROVENTIL) (2.5 MG/3ML) 0.083% nebulizer solution Take 3 mLs (2.5 mg total) by nebulization every 3 (three) hours as needed for wheezing or shortness of breath. 10/11/14   Whiteheart, Cristal Ford, NP  Amino Acids-Protein Hydrolys (FEEDING SUPPLEMENT, PRO-STAT SUGAR FREE 64,) LIQD Place 30 mLs into feeding tube 2 (two) times daily at 10 AM and 5 PM. 10/11/14   Whiteheart, Cristal Ford, NP  aspirin 81 MG tablet Take 81 mg by mouth daily.    [provider]  atorvastatin (LIPITOR) 20 MG tablet Take 20 mg by mouth daily.    [provider]  chlorhexidine (PERIDEX) 0.12 % solution 15 mLs by Mouth Rinse route 2 (two) times daily. 10/11/14   Whiteheart, Cristal Ford, NP  enoxaparin (LOVENOX) 40 MG/0.4ML injection Inject 0.4 mLs (40 mg total) into the skin daily. 10/11/14   Whiteheart, Cristal Ford, NP  feeding supplement, ENSURE COMPLETE, (ENSURE COMPLETE) LIQD Take 237 mLs by mouth 2 (two) times daily between meals. 10/07/14   Thurnell Lose, MD  fentaNYL (SUBLIMAZE) SOLN Inject 25-50 mcg into the vein every hour as needed. 10/11/14   Whiteheart, Cristal Ford, NP  insulin aspart (NOVOLOG) 100 UNIT/ML injection Inject 0-15 Units into the skin every 4 (four) hours. 10/11/14   Whiteheart, Cristal Ford, NP  ipratropium-albuterol (DUONEB) 0.5-2.5 (3) MG/3ML SOLN Take 3 mLs by nebulization every 6 (six) hours. 10/11/14   Whiteheart, Cristal Ford, NP  LORazepam (ATIVAN) 2 MG/ML injection Inject 0.5 mLs (1 mg total) into the vein every 4 (four) hours as needed for anxiety. 10/11/14   Whiteheart,  Cristal Ford, NP  Nutritional Supplements (FEEDING SUPPLEMENT, VITAL AF 1.2 CAL,) LIQD Place 1,000 mLs into feeding tube continuous. 10/11/14   Whiteheart, Cristal Ford, NP  pantoprazole sodium (PROTONIX) 40 mg/20 mL PACK Place 20 mLs (40 mg total) into feeding tube daily. 10/11/14   Whiteheart, Cristal Ford, NP  risperiDONE (RISPERDAL) 0.5 MG tablet Take 1 tablet (0.5 mg total) by mouth at bedtime. 10/11/14   Whiteheart, Cristal Ford, NP  vancomycin (VANCOCIN) 1 GM/200ML SOLN Inject 200 mLs (1,000 mg total) into the vein every 12 (twelve) hours. 10/11/14   Whiteheart, Cristal Ford, NP     Positive ROS: neg  All other systems have been reviewed and were otherwise negative with the exception of those mentioned in the HPI and as above.  Physical Exam: There were no vitals filed for this visit.  General: Alert, Intubated Nasal: Clear nasal passages Neck: No palpable adenopathy or thyroid nodules. Trach midline Cardiovascular: Regular rate and rhythm, no murmur.  Respiratory: Clear to auscultation   Assessment/Plan: RESPITAORY FAILURE Plan for Procedure(s): TRACHEOSTOMY   Melony Overly, MD 05/27/2018 8:59 AM

## 2018-05-27 NOTE — Interval H&P Note (Signed)
History and Physical Interval Note:  05/27/2018 9:09 AM  Debbie Pena  has presented today for surgery, with the diagnosis of RESPITAORY FAILURE  The various methods of treatment have been discussed with the patient and family. After consideration of risks, benefits and other options for treatment, the patient has consented to  Procedure(s): TRACHEOSTOMY (N/A) as a surgical intervention .  The patient's history has been reviewed, patient examined, no change in status, stable for surgery.  I have reviewed the patient's chart and labs.  Questions were answered to the patient's satisfaction.     Melony Overly

## 2018-05-27 NOTE — Anesthesia Postprocedure Evaluation (Signed)
Anesthesia Post Note  Patient: Debbie Pena  Procedure(s) Performed: TRACHEOSTOMY (N/A Neck)     Patient location during evaluation: SICU Anesthesia Type: General Level of consciousness: sedated Pain management: pain level controlled Vital Signs Assessment: post-procedure vital signs reviewed and stable Respiratory status: patient remains intubated per anesthesia plan Cardiovascular status: stable Postop Assessment: no apparent nausea or vomiting Anesthetic complications: no                      Toriann Spadoni,W. EDMOND

## 2018-05-27 NOTE — Anesthesia Preprocedure Evaluation (Signed)
Anesthesia Evaluation  Patient identified by MRN, date of birth, ID band Patient unresponsive    Reviewed: Allergy & Precautions, H&P , NPO status , Patient's Chart, lab work & pertinent test results  Airway Mallampati: Intubated       Dental no notable dental hx. (+) Teeth Intact, Dental Advisory Given   Pulmonary sleep apnea , COPD, former smoker,  VDRF   Pulmonary exam normal breath sounds clear to auscultation       Cardiovascular hypertension,  Rhythm:Regular Rate:Normal     Neuro/Psych Schizophrenia negative neurological ROS     GI/Hepatic Neg liver ROS, GERD  Medicated and Controlled,  Endo/Other  negative endocrine ROS  Renal/GU negative Renal ROS  negative genitourinary   Musculoskeletal   Abdominal   Peds  Hematology negative hematology ROS (+)   Anesthesia Other Findings   Reproductive/Obstetrics negative OB ROS                             Anesthesia Physical Anesthesia Plan  ASA: IV  Anesthesia Plan: General   Post-op Pain Management:    Induction: Intravenous  PONV Risk Score and Plan: 3 and Treatment may vary due to age or medical condition  Airway Management Planned: Oral ETT  Additional Equipment:   Intra-op Plan:   Post-operative Plan: Post-operative intubation/ventilation  Informed Consent: I have reviewed the patients History and Physical, chart, labs and discussed the procedure including the risks, benefits and alternatives for the proposed anesthesia with the patient or authorized representative who has indicated his/her understanding and acceptance.   Dental advisory given  Plan Discussed with: CRNA  Anesthesia Plan Comments:         Anesthesia Quick Evaluation

## 2018-05-27 NOTE — Op Note (Signed)
NAME: Debbie Pena, [place] MEDICAL RECORD [id number] ACCOUNT [id number] DATE OF BIRTH:23-Aug-1946 FACILITY: [facility] LOCATION: [place], [place]  OPERATIVE REPORT  DATE OF PROCEDURE:  05/27/2018  PREOPERATIVE DIAGNOSIS:  Acute on chronic respiratory failure.  POSTOPERATIVE DIAGNOSIS:  Acute on chronic respiratory failure.  OPERATION PERFORMED:  Tracheotomy with a #6 Shiley cuffed tube.  SURGEON:  Melony Overly, MD.  ANESTHESIA:  General endotracheal.  ESTIMATED BLOOD LOSS:  Minimal.  COMPLICATIONS:  None.  BRIEF CLINICAL NOTE:  The patient is a 72 year old female who has had a history of bad COPD, obstructive sleep apnea, hypertension, and type 2 diabetes.  She recently had pneumonia and admitted to outside hospital in [place] requiring intubation and a  hospital stay.  Weaning her was unsuccessful and the patient was subsequently transferred to the [facility] 6 days ago.  Weaning her has been unsuccessful and a tracheotomy was recommended.  Of note, she has had previous tracheotomy as she  has an old tracheotomy scar.  She is taken to the operating room at this time for tracheostomy.  On examination of the neck, she has an old trach scar.  No palpable masses and trachea was midline.    DESCRIPTION OF PROCEDURE:  She was brought down straight to the operating room from [facility].  She remained in her bed.  She underwent a general endotracheal anesthesia.  A roll was placed underneath her shoulders to extend her neck.   The area of proposed tracheotomy was marked and injected with Xylocaine with epinephrine.  An incision was made just below the old trach scar.  Some of the trach scar was removed.  Cautery was used to cauterize the subcutaneous tissue.  I basically  followed the scar down to the trachea and extended slightly inferiorly.  The thyroid isthmus had been previously divided.  Using the cautery, there was really  essentially no bleeding.  After exposing tracheal ring just below the old trach scar, a  horizontal tracheotomy was made just below the old trach scar.  The endotracheal tube was removed and a #6 Shiley cuffed tube was inserted.  The patient was ventilated well.  The area was examined.  There was no bleeding.  This completed the procedure.   The tracheostomy was secured to the skin with 2-0 silk sutures x4 and a Velcro trach collar around the neck.  The patient had a previous orogastric tube that had been removed along with the endotracheal tube and a new nasogastric tube was passed into her  stomach by CRNA.  The patient was subsequently transferred back to [facility].  TN/NUANCE  D:05/27/2018 T:05/27/2018 JOB:[id number]

## 2018-05-30 ENCOUNTER — Encounter (HOSPITAL_COMMUNITY): Payer: Self-pay | Admitting: Otolaryngology

## 2018-05-30 ENCOUNTER — Other Ambulatory Visit (HOSPITAL_COMMUNITY): Payer: Self-pay

## 2018-05-30 DIAGNOSIS — J189 Pneumonia, unspecified organism: Secondary | ICD-10-CM

## 2018-05-30 DIAGNOSIS — Z930 Tracheostomy status: Secondary | ICD-10-CM

## 2018-05-30 DIAGNOSIS — G4733 Obstructive sleep apnea (adult) (pediatric): Secondary | ICD-10-CM | POA: Diagnosis not present

## 2018-05-30 DIAGNOSIS — J9602 Acute respiratory failure with hypercapnia: Secondary | ICD-10-CM

## 2018-05-30 DIAGNOSIS — J9601 Acute respiratory failure with hypoxia: Secondary | ICD-10-CM

## 2018-05-30 DIAGNOSIS — J438 Other emphysema: Secondary | ICD-10-CM

## 2018-05-30 NOTE — Progress Notes (Signed)
Pulmonary [place]   PULMONARY SERVICE  PROGRESS NOTE  Date of Service: 05/30/2018  Debbie Pena  ZPH:[id number]  DOB: 12-13-1945   DOA: 05/27/2018  Referring Physician: Merton Border, MD  HPI: Debbie Pena is a 72 y.o. female seen for follow up of Acute on Chronic Respiratory Failure.  Patient remains on full vent support we had placed her on spontaneous breathing trial and did not tolerate it.  Patient's mechanics were quite poor with increased respiratory breathing noted.  Medications: Reviewed on Rounds  Physical Exam:  Vitals: Temperature 97.5 pulse 93 respiratory rate 25 blood pressure 128/75 saturations 98%  Ventilator Settings mode of ventilation assist control FiO2 20% tidal volume 413 PEEP 5  . General: Comfortable at this time . Eyes: Grossly normal lids, irises & conjunctiva . ENT: grossly tongue is normal . Neck: no obvious mass . Cardiovascular: S1 S2 normal no gallop . Respiratory: No rhonchi expansion is equal . Abdomen: soft . Skin: no rash seen on limited exam . Musculoskeletal: not rigid . Psychiatric:unable to assess . Neurologic: no seizure no involuntary movements         Lab Data:   Basic Metabolic Panel: Recent Labs  Lab 05/25/18 0642  NA 141  K 4.3  CL 111  CO2 24  GLUCOSE 159*  BUN 29*  CREATININE 0.54  CALCIUM 8.7*    Liver Function Tests: No results for input(s): AST, ALT, ALKPHOS, BILITOT, PROT, ALBUMIN in the last 168 hours. No results for input(s): LIPASE, AMYLASE in the last 168 hours. No results for input(s): AMMONIA in the last 168 hours.  CBC: Recent Labs  Lab 05/25/18 0642  WBC 9.9  HGB 8.9*  HCT 28.3*  MCV 87.1  PLT 212    Cardiac Enzymes: No results for input(s): CKTOTAL, CKMB, CKMBINDEX, TROPONINI in the last 168 hours.  BNP (last 3 results) No results for input(s): BNP in the last 8760 hours.  ProBNP (last 3 results) No results for input(s): PROBNP in  the last 8760 hours.  Radiological Exams: No results found.  Assessment/Plan Active Problems:   Acute respiratory failure with hypoxia and hypercarbia (HCC)   COPD (chronic obstructive pulmonary disease) (HCC)   OSA (obstructive sleep apnea)   HCAP (healthcare-associated pneumonia)   Tracheostomy status ([place])   1. Acute on chronic respiratory failure with hypoxia we will continue with full vent support right now we checked the spontaneous breathing index and this was elevated.  Will reassess again tomorrow. 2. COPD severe disease we will continue with present management. 3. Healthcare associated pneumonia treated we will follow 4. Sleep apnea nonissue on the tracheostomy 5. Tracheostomy status post fresh trach last week   I have personally seen and evaluated the patient, evaluated laboratory and imaging results, formulated the assessment and plan and placed orders. The Patient requires high complexity decision making for assessment and support.  Case was discussed on Rounds with the Respiratory Therapy Staff  Allyne Gee, MD [facility] Pulmonary Critical Care Medicine Sleep Medicine

## 2018-05-31 ENCOUNTER — Other Ambulatory Visit (HOSPITAL_COMMUNITY): Payer: Self-pay

## 2018-05-31 DIAGNOSIS — J438 Other emphysema: Secondary | ICD-10-CM | POA: Diagnosis not present

## 2018-05-31 DIAGNOSIS — J9601 Acute respiratory failure with hypoxia: Secondary | ICD-10-CM | POA: Diagnosis not present

## 2018-05-31 DIAGNOSIS — J189 Pneumonia, unspecified organism: Secondary | ICD-10-CM | POA: Diagnosis not present

## 2018-05-31 DIAGNOSIS — J9602 Acute respiratory failure with hypercapnia: Secondary | ICD-10-CM | POA: Diagnosis not present

## 2018-05-31 DIAGNOSIS — Z930 Tracheostomy status: Secondary | ICD-10-CM | POA: Diagnosis not present

## 2018-05-31 DIAGNOSIS — G4733 Obstructive sleep apnea (adult) (pediatric): Secondary | ICD-10-CM | POA: Diagnosis not present

## 2018-05-31 LAB — BASIC METABOLIC PANEL
Anion gap: 8 (ref 5–15)
BUN: 26 mg/dL — AB (ref 8–23)
CHLORIDE: 107 mmol/L (ref 98–111)
CO2: 24 mmol/L (ref 22–32)
CREATININE: 0.47 mg/dL (ref 0.44–1.00)
Calcium: 8.7 mg/dL — ABNORMAL LOW (ref 8.9–10.3)
GFR calc non Af Amer: >60 mL/min (ref >60)
Glucose, Bld: 136 mg/dL — ABNORMAL HIGH (ref 70–99)
POTASSIUM: 4.5 mmol/L (ref 3.5–5.1)
SODIUM: 139 mmol/L (ref 135–145)

## 2018-05-31 LAB — CBC
HEMATOCRIT: 30.4 % — AB (ref 36.0–46.0)
HEMOGLOBIN: 9.4 g/dL — AB (ref 12.0–15.0)
MCH: 28.2 pg (ref 26.0–34.0)
MCHC: 30.9 g/dL (ref 30.0–36.0)
MCV: 91.3 fL (ref 78.0–100.0)
Platelets: 198 10*3/uL (ref 150–400)
RBC: 3.33 MIL/uL — AB (ref 3.87–5.11)
RDW: 16.7 % — ABNORMAL HIGH (ref 11.5–15.5)
WBC: 10.4 10*3/uL (ref 4.0–10.5)

## 2018-05-31 NOTE — Progress Notes (Signed)
Pulmonary [facility]   PULMONARY SERVICE  PROGRESS NOTE  Date of Service: 05/31/2018  Debbie Pena  ZOX:[id number]  DOB: [birth year]   DOA: 05/27/2018  Referring Physician: Merton Border, MD  HPI: Debbie Pena is a 72 y.o. female seen for follow up of Acute on Chronic Respiratory Failure.  Patient remains on full vent support currently is on assist control mode patient has been on 28% FiO2.  Tidal volumes are about 414 cc with a PEEP of 5  Medications: Reviewed on Rounds  Physical Exam:  Vitals: Temperature 97.5 pulse 87 respiratory 27 blood pressure 130/84 saturations 100%  Ventilator Settings mode of ventilation as assist control FiO2 28% tidal volume 414 PEEP 5  . General: Comfortable at this time . Eyes: Grossly normal lids, irises & conjunctiva . ENT: grossly tongue is normal . Neck: no obvious mass . Cardiovascular: S1 S2 normal no gallop . Respiratory: Coarse rhonchi noted at this time . Abdomen: soft . Skin: no rash seen on limited exam . Musculoskeletal: not rigid . Psychiatric:unable to assess . Neurologic: no seizure no involuntary movements         Lab Data:   Basic Metabolic Panel: Recent Labs  Lab 05/25/18 0642 05/31/18 0733  NA 141 139  K 4.3 4.5  CL 111 107  CO2 24 24  GLUCOSE 159* 136*  BUN 29* 26*  CREATININE 0.54 0.47  CALCIUM 8.7* 8.7*    Liver Function Tests: No results for input(s): AST, ALT, ALKPHOS, BILITOT, PROT, ALBUMIN in the last 168 hours. No results for input(s): LIPASE, AMYLASE in the last 168 hours. No results for input(s): AMMONIA in the last 168 hours.  CBC: Recent Labs  Lab 05/25/18 0642 05/31/18 0733  WBC 9.9 10.4  HGB 8.9* 9.4*  HCT 28.3* 30.4*  MCV 87.1 91.3  PLT 212 198    Cardiac Enzymes: No results for input(s): CKTOTAL, CKMB, CKMBINDEX, TROPONINI in the last 168 hours.  BNP (last 3 results) No results for input(s): BNP in the last 8760 hours.  ProBNP  (last 3 results) No results for input(s): PROBNP in the last 8760 hours.  Radiological Exams: Ct Abdomen Wo Contrast  Result Date: 05/30/2018 CLINICAL DATA:  Preop for gastrostomy tube EXAM: CT ABDOMEN WITHOUT CONTRAST TECHNIQUE: Multidetector CT imaging of the abdomen was performed following the standard protocol without IV contrast. COMPARISON:  10/13/2014 FINDINGS: Lower chest: Bibasilar posterior lower lobe airspace disease. Small pleural effusions. Hepatobiliary: Pneumobilia. Multiple gallstones are noted. The cyst in the right lobe of the liver is smaller. 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 Today it measures 4.2 x 8.3 cm. Previously it measured 6.8 x 3.6 cm. Right kidney and adrenal glands are within normal limits. Stomach/Bowel: The stomach is opposed to the anterior abdominal wall. There is a fistulous tract leading from the skin into the body of the stomach. The tract does contain a gas bubble in the anterior abdominal wall. Moderate stool burden in the colon. Diverticulosis of the descending colon. No evidence of small-bowel obstruction. Vascular/Lymphatic: Aortic atherosclerotic calcifications. No evidence of aneurysm. No abnormal retroperitoneal adenopathy. Other: No free fluid. Musculoskeletal: New T11 superior endplate depression with 20% loss of height. There is also a superior endplate depression at L3 with central height loss. IMPRESSION: The stomach is well opposed to the anterior abdominal wall. There is a fistulous tract leading from the skin into the body of the stomach from a prior  gastrostomy tube. The tract does contain a gas bubble in the abdominal wall. Pneumobilia. Gallstones. Bilateral lower lobe airspace disease and bilateral pleural effusions. 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 T11 and L3 compression fractures of indeterminate age. Aortic Atherosclerosis (ICD10-I70.0).  Electronically Signed   By: Marybelle Killings M.D.   On: 05/30/2018 19:34   Dg Chest Port 1 View  Result Date: 05/31/2018 CLINICAL DATA:  Shortness of breath EXAM: PORTABLE CHEST 1 VIEW COMPARISON:  05/23/2018 FINDINGS: Cardiac shadows within normal limits. Nasogastric catheter and endotracheal tube are noted in satisfactory position. Bibasilar atelectatic changes are again seen and stable. The overall inspiratory effort is poor. IMPRESSION: Tubes and lines as described above. Bibasilar atelectatic changes are again seen. Electronically Signed   By: Inez Catalina M.D.   On: 05/31/2018 09:06    Assessment/Plan Active Problems:   Acute respiratory failure with hypoxia and hypercarbia (HCC)   COPD (chronic obstructive pulmonary disease) (HCC)   OSA (obstructive sleep apnea)   HCAP (healthcare-associated pneumonia)   Tracheostomy status ([place])   1. Acute on chronic respiratory failure with hypoxia we will continue with full vent support on assist control patient's mechanics and spontaneous breathing index has been poor so is not able to wean at this time. 2. COPD severe disease poor prognosis 3. Healthcare associated pneumonia has been treated we will continue to monitor follow-up x-ray done showed basilar changes with low volumes at this time. 4. Sleep apnea currently nonissue   I have personally seen and evaluated the patient, evaluated laboratory and imaging results, formulated the assessment and plan and placed orders. The Patient requires high complexity decision making for assessment and support.  Case was discussed on Rounds with the Respiratory Therapy Staff  Allyne Gee, MD [facility] Pulmonary Critical Care Medicine Sleep Medicine

## 2018-05-31 NOTE — Consult Note (Addendum)
Chief Complaint: Patient was seen in consultation today for percutaneous gastric tube placement at the request of [place]  Referring Physician(s): Hijazi,Ali  Supervising Physician: Sandi Mariscal  Patient Status: [facility] IP  History of Present Illness: Debbie Pena is a 72 y.o. female   COPD Resp failure Trach Failure to thrive  Gtube was placed in IR 2015 Remained just few months per Daughter  Readmitted to [facility] 05/19/18 Vent managemant Now with dysphagia FTT Malnutrition Need for long term care  Request for perc G tube placement  Dr Pascal Lux has reviewed imaging and approves procedure Scheduled in IR tentatively for tomorrow   Past Medical History:  Diagnosis Date  . Chronic respiratory failure with hypoxia ([place])   . COPD (chronic obstructive pulmonary disease) ([place])   . GERD (gastroesophageal reflux disease)   . Hepatic cyst   . HTN (hypertension)   . Hyperlipidemia   . Obesity hypoventilation syndrome ([place])   . OSA (obstructive sleep apnea)   . Renal angiolipoma   . Schizoaffective disorder [facility])     Past Surgical History:  Procedure Laterality Date  . TRACHEOSTOMY TUBE PLACEMENT N/A 05/27/2018   Procedure: TRACHEOSTOMY;  Surgeon: Rozetta Nunnery, MD;  [facility];  Service: ENT;  Laterality: N/A;    Allergies: Klonopin [clonazepam] and Penicillins  Medications: Prior to Admission medications   Medication Sig Start Date End Date Taking? Authorizing Provider  albuterol (PROVENTIL) (2.5 MG/3ML) 0.083% nebulizer solution Take 3 mLs (2.5 mg total) by nebulization every 3 (three) hours as needed for wheezing or shortness of breath. 10/11/14   Whiteheart, Cristal Ford, NP  Amino Acids-Protein Hydrolys (FEEDING SUPPLEMENT, PRO-STAT SUGAR FREE 64,) LIQD Place 30 mLs into feeding tube 2 (two) times daily at 10 AM and 5 PM. 10/11/14   Whiteheart, Cristal Ford, NP  aspirin 81 MG tablet Take 81 mg by mouth daily.    [provider]    atorvastatin (LIPITOR) 20 MG tablet Take 20 mg by mouth daily.    [provider]  chlorhexidine (PERIDEX) 0.12 % solution 15 mLs by Mouth Rinse route 2 (two) times daily. 10/11/14   Whiteheart, Cristal Ford, NP  enoxaparin (LOVENOX) 40 MG/0.4ML injection Inject 0.4 mLs (40 mg total) into the skin daily. 10/11/14   Whiteheart, Cristal Ford, NP  feeding supplement, ENSURE COMPLETE, (ENSURE COMPLETE) LIQD Take 237 mLs by mouth 2 (two) times daily between meals. 10/07/14   Thurnell Lose, MD  fentaNYL (SUBLIMAZE) SOLN Inject 25-50 mcg into the vein every hour as needed. 10/11/14   Whiteheart, Cristal Ford, NP  insulin aspart (NOVOLOG) 100 UNIT/ML injection Inject 0-15 Units into the skin every 4 (four) hours. 10/11/14   Whiteheart, Cristal Ford, NP  ipratropium-albuterol (DUONEB) 0.5-2.5 (3) MG/3ML SOLN Take 3 mLs by nebulization every 6 (six) hours. 10/11/14   Whiteheart, Cristal Ford, NP  LORazepam (ATIVAN) 2 MG/ML injection Inject 0.5 mLs (1 mg total) into the vein every 4 (four) hours as needed for anxiety. 10/11/14   Whiteheart, Cristal Ford, NP  Nutritional Supplements (FEEDING SUPPLEMENT, VITAL AF 1.2 CAL,) LIQD Place 1,000 mLs into feeding tube continuous. 10/11/14   Whiteheart, Cristal Ford, NP  pantoprazole sodium (PROTONIX) 40 mg/20 mL PACK Place 20 mLs (40 mg total) into feeding tube daily. 10/11/14   Whiteheart, Cristal Ford, NP  risperiDONE (RISPERDAL) 0.5 MG tablet Take 1 tablet (0.5 mg total) by mouth at bedtime. 10/11/14   Whiteheart, Cristal Ford, NP  vancomycin (VANCOCIN) 1 GM/200ML SOLN Inject 200 mLs (1,000 mg  total) into the vein every 12 (twelve) hours. 10/11/14   Whiteheart, Cristal Ford, NP     No family history on file.  Social History   Socioeconomic History  . Marital status: Single    Spouse name: Not on file  . Number of children: Not on file  . Years of education: Not on file  . Highest education level: Not on file  Occupational History  . Not on file  Social Needs  . Financial resource  strain: Not on file  . Food insecurity:    Worry: Not on file    Inability: Not on file  . Transportation needs:    Medical: Not on file    Non-medical: Not on file  Tobacco Use  . Smoking status: Former Smoker  Substance and Sexual Activity  . Alcohol use: Yes  . Drug use: No  . Sexual activity: Not on file  Lifestyle  . Physical activity:    Days per week: Not on file    Minutes per session: Not on file  . Stress: Not on file  Relationships  . Social connections:    Talks on phone: Not on file    Gets together: Not on file    Attends religious service: Not on file    Active member of club or organization: Not on file    Attends meetings of clubs or organizations: Not on file    Relationship status: Not on file  Other Topics Concern  . Not on file  Social History Narrative  . Not on file    Review of Systems: A 12 point ROS discussed and pertinent positives are indicated in the HPI above.  All other systems are negative.  Review of Systems  Constitutional:       Ill appearing female Trach/vent  Respiratory:       Trach  Neurological: Positive for weakness.  Psychiatric/Behavioral: Positive for decreased concentration.    Vital Signs: There were no vitals taken for this visit.  Physical Exam  Cardiovascular: Normal rate and regular rhythm.  Pulmonary/Chest:  trach  Abdominal: Soft.  Musculoskeletal:  Weak; little response  Neurological: She is alert.  Skin: Skin is warm.  Psychiatric:  Consented with Daughter Alwyn Ren via phone  Nursing note and vitals reviewed.   Imaging: Ct Abdomen Wo Contrast  Result Date: 05/30/2018 CLINICAL DATA:  Preop for gastrostomy tube EXAM: CT ABDOMEN WITHOUT CONTRAST TECHNIQUE: Multidetector CT imaging of the abdomen was performed following the standard protocol without IV contrast. COMPARISON:  10/13/2014 FINDINGS: Lower chest: Bibasilar posterior lower lobe airspace disease. Small pleural effusions. Hepatobiliary:  Pneumobilia. Multiple gallstones are noted. The cyst in the right lobe of the liver is smaller. 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 Today it measures 4.2 x 8.3 cm. Previously it measured 6.8 x 3.6 cm. Right kidney and adrenal glands are within normal limits. Stomach/Bowel: The stomach is opposed to the anterior abdominal wall. There is a fistulous tract leading from the skin into the body of the stomach. The tract does contain a gas bubble in the anterior abdominal wall. Moderate stool burden in the colon. Diverticulosis of the descending colon. No evidence of small-bowel obstruction. Vascular/Lymphatic: Aortic atherosclerotic calcifications. No evidence of aneurysm. No abnormal retroperitoneal adenopathy. Other: No free fluid. Musculoskeletal: New T11 superior endplate depression with 20% loss of height. There is also a superior endplate depression at L3 with central height loss. IMPRESSION: The stomach is well opposed to the  anterior abdominal wall. There is a fistulous tract leading from the skin into the body of the stomach from a prior gastrostomy tube. The tract does contain a gas bubble in the abdominal wall. Pneumobilia. Gallstones. Bilateral lower lobe airspace disease and bilateral pleural effusions. 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 T11 and L3 compression fractures of indeterminate age. Aortic Atherosclerosis (ICD10-I70.0). Electronically Signed   By: Marybelle Killings M.D.   On: 05/30/2018 19:34   Dg Chest Port 1 View  Result Date: 05/31/2018 CLINICAL DATA:  Shortness of breath EXAM: PORTABLE CHEST 1 VIEW COMPARISON:  05/23/2018 FINDINGS: Cardiac shadows within normal limits. Nasogastric catheter and endotracheal tube are noted in satisfactory position. Bibasilar atelectatic changes are again seen and stable. The overall inspiratory effort is poor. IMPRESSION: Tubes and lines as described  above. Bibasilar atelectatic changes are again seen. Electronically Signed   By: Inez Catalina M.D.   On: 05/31/2018 09:06   Dg Chest Port 1 View  Result Date: 05/23/2018 CLINICAL DATA:  Respiratory failure EXAM: PORTABLE CHEST 1 VIEW COMPARISON:  Four days ago FINDINGS: Endotracheal tube tip just below the clavicular heads. An orogastric tube reaches the stomach at least. Low volume chest with streaky opacities. Interface over the lower lateral left chest best attributed to skin fold. Normal heart size accounting for technique. IMPRESSION: 1. Low volume chest with atelectasis. 2. Stable tube positioning. Electronically Signed   By: Monte Fantasia M.D.   On: 05/23/2018 07:24   Dg Chest Port 1 View  Result Date: 05/19/2018 CLINICAL DATA:  Evaluate ETT placement EXAM: PORTABLE CHEST 1 VIEW COMPARISON:  March 16, 2015 FINDINGS: An ET tube terminates in the mid trachea in good position. The OG tube terminates below today's film. No pneumothorax. No nodules or masses. The cardiomediastinal silhouette is stable. Bilateral small pleural effusions with underlying atelectasis remain. IMPRESSION: 1. Support apparatus as above. 2. Small bilateral pleural effusions with underlying atelectasis. 3. No other changes. Electronically Signed   By: Dorise Bullion III M.D   On: 05/19/2018 17:37   Dg Abd Portable 1v  Result Date: 05/27/2018 CLINICAL DATA:  Nasogastric tube placement EXAM: PORTABLE ABDOMEN - 1 VIEW COMPARISON:  Portable exam 1250 hours compared to 05/21/2018 FINDINGS: Tip of nasogastric tube projects over mid stomach. Nonobstructive bowel gas pattern. Scattered gas and stool throughout colon. No bowel dilatation or bowel wall thickening. Mild bibasilar atelectasis. Bones demineralized. IMPRESSION: Tip of nasogastric tube projects over mid stomach. Normal bowel gas pattern. Electronically Signed   By: Lavonia Dana M.D.   On: 05/27/2018 12:57   Dg Abd Portable 1v  Result Date: 05/21/2018 CLINICAL DATA:   Orogastric tube placement. EXAM: PORTABLE ABDOMEN - 1 VIEW COMPARISON:  Abdominal radiograph May 19, 2018 FINDINGS: Nasogastric tube tip projects in gastric antrum. Included bowel gas pattern is nondilated and nonobstructive. Probable temperature probe projecting in the pelvis. No intra-abdominal mass effect or pathologic calcifications. Osteopenia. Old mild lumbar spine compression fractures. IMPRESSION: Nasogastric tube tip projecting in the gastric antrum. Normal bowel gas pattern. Electronically Signed   By: Elon Alas M.D.   On: 05/21/2018 22:46   Dg Abd Portable 1v  Result Date: 05/19/2018 CLINICAL DATA:  Evaluate OG tube placement EXAM: PORTABLE ABDOMEN - 1 VIEW COMPARISON:  None. FINDINGS: The OG tube terminates at the midline of the inferior abdomen. Small bilateral effusions with underlying atelectasis. No other abnormalities. IMPRESSION: The OG tube terminates in the inferior abdomen at midline, likely within the distal aspect of  a J-shaped stomach. Electronically Signed   By: Dorise Bullion III M.D   On: 05/19/2018 17:36    Labs:  CBC: Recent Labs    05/20/18 0620 05/25/18 0642 05/31/18 0733  WBC 12.6* 9.9 10.4  HGB 9.4* 8.9* 9.4*  HCT 29.9* 28.3* 30.4*  PLT 223 212 198    COAGS: Recent Labs    05/20/18 0620  INR 0.98    BMP: Recent Labs    05/20/18 0620 05/25/18 0642 05/31/18 0733  NA 139 141 139  K 4.6 4.3 4.5  CL 106 111 107  CO2 28 24 24   GLUCOSE 122* 159* 136*  BUN 17 29* 26*  CALCIUM 8.8* 8.7* 8.7*  CREATININE 0.56 0.54 0.47  GFRNONAA >60 >60 >60  GFRAA >60 >60 >60    LIVER FUNCTION TESTS: Recent Labs    05/20/18 0620  BILITOT 0.4  AST 17  ALT 21  ALKPHOS 43  PROT 5.3*  ALBUMIN 2.9*    TUMOR MARKERS: No results for input(s): AFPTM, CEA, CA199, CHROMGRNA in the last 8760 hours.  Assessment and Plan:  COPD; Resp failure; Trach/vent Dysphagia FTT PCM Need for long term care Scheduled for percutaneous gastric tube  placement Risks and benefits discussed with the patient's daughter Alwyn Ren via phone- including, but not limited to the need for a barium enema during the procedure, bleeding, infection, peritonitis, or damage to adjacent structures.  All of her questions were answered, she is agreeable to proceed. Consent signed and in chart.   Thank you for this interesting consult.  I greatly enjoyed meeting KELA BACCARI and look forward to participating in their care.  A copy of this report was sent to the requesting provider on this date.  Electronically Signed: Lavonia Drafts, PA-C 05/31/2018, 1:29 PM   I spent a total of 40 Minutes    in face to face in clinical consultation, greater than 50% of which was counseling/coordinating care for percutaneous gastric tube placement

## 2018-06-01 ENCOUNTER — Other Ambulatory Visit (HOSPITAL_COMMUNITY): Payer: Self-pay

## 2018-06-01 ENCOUNTER — Encounter (HOSPITAL_COMMUNITY): Payer: Self-pay | Admitting: Interventional Radiology

## 2018-06-01 DIAGNOSIS — J438 Other emphysema: Secondary | ICD-10-CM | POA: Diagnosis not present

## 2018-06-01 DIAGNOSIS — J9601 Acute respiratory failure with hypoxia: Secondary | ICD-10-CM | POA: Diagnosis not present

## 2018-06-01 DIAGNOSIS — G4733 Obstructive sleep apnea (adult) (pediatric): Secondary | ICD-10-CM | POA: Diagnosis not present

## 2018-06-01 DIAGNOSIS — J189 Pneumonia, unspecified organism: Secondary | ICD-10-CM | POA: Diagnosis not present

## 2018-06-01 HISTORY — PX: IR GASTROSTOMY TUBE MOD SED: IMG625

## 2018-06-01 LAB — PROTIME-INR
INR: 1.04
PROTHROMBIN TIME: 13.5 s (ref 11.4–15.2)

## 2018-06-01 MED ORDER — LIDOCAINE HCL (PF) 1 % IJ SOLN
INTRAMUSCULAR | Status: AC | PRN
  Administered 2018-06-01: 10 mL

## 2018-06-01 MED ORDER — SODIUM CHLORIDE 0.9 % IV SOLN
INTRAVENOUS | Status: AC | PRN
  Administered 2018-06-01: 10 mL/h via INTRAVENOUS

## 2018-06-01 MED ORDER — VANCOMYCIN HCL 500 MG IV SOLR
INTRAVENOUS | Status: AC | PRN
  Administered 2018-06-01: 1000 mg via INTRAVENOUS

## 2018-06-01 MED ORDER — IOPAMIDOL (ISOVUE-300) INJECTION 61%
INTRAVENOUS | Status: AC
  Filled 2018-06-01: qty 50

## 2018-06-01 MED ORDER — MIDAZOLAM HCL 2 MG/2ML IJ SOLN
INTRAMUSCULAR | Status: AC
  Filled 2018-06-01: qty 2

## 2018-06-01 MED ORDER — FENTANYL CITRATE (PF) 100 MCG/2ML IJ SOLN
INTRAMUSCULAR | Status: AC | PRN
  Administered 2018-06-01: 100 ug via INTRAVENOUS

## 2018-06-01 MED ORDER — LIDOCAINE HCL 1 % IJ SOLN
INTRAMUSCULAR | Status: AC
  Filled 2018-06-01: qty 20

## 2018-06-01 MED ORDER — MIDAZOLAM HCL 2 MG/2ML IJ SOLN
INTRAMUSCULAR | Status: AC | PRN
  Administered 2018-06-01: 2 mg via INTRAVENOUS

## 2018-06-01 MED ORDER — FENTANYL CITRATE (PF) 100 MCG/2ML IJ SOLN
INTRAMUSCULAR | Status: AC
  Filled 2018-06-01: qty 2

## 2018-06-01 NOTE — Progress Notes (Signed)
Pulmonary [place]   PULMONARY SERVICE  PROGRESS NOTE  Date of Service: 06/01/2018  Debbie BREON  Pena:[id number]  DOB: [birth year]   DOA: 05/27/2018  Referring Physician: Merton Border, MD  HPI: Debbie Pena is a 72 y.o. female seen for follow up of Acute on Chronic Respiratory Failure.  Patient was on full vent support had been on assist control mode has been noted to have foul-smelling sputum.  There is concern whether there may be some infection of the tracheal site also.  Currently is good saturations are noted.  Medications: Reviewed on Rounds  Physical Exam:  Vitals: Temperature 97.3 pulse 80 respiratory 26 blood pressure 152/87 saturations 100%  Ventilator Settings mode of ventilation assist control FiO2 20% tidal volume 430 PEEP 5  . General: Comfortable at this time . Eyes: Grossly normal lids, irises & conjunctiva . ENT: grossly tongue is normal . Neck: no obvious mass . Cardiovascular: S1 S2 normal no gallop . Respiratory: No rhonchi or rales are noted at this time . Abdomen: soft . Skin: no rash seen on limited exam . Musculoskeletal: not rigid . Psychiatric:unable to assess . Neurologic: no seizure no involuntary movements         Lab Data:   Basic Metabolic Panel: Recent Labs  Lab 05/31/18 0733  NA 139  K 4.5  CL 107  CO2 24  GLUCOSE 136*  BUN 26*  CREATININE 0.47  CALCIUM 8.7*    Liver Function Tests: No results for input(s): AST, ALT, ALKPHOS, BILITOT, PROT, ALBUMIN in the last 168 hours. No results for input(s): LIPASE, AMYLASE in the last 168 hours. No results for input(s): AMMONIA in the last 168 hours.  CBC: Recent Labs  Lab 05/31/18 0733  WBC 10.4  HGB 9.4*  HCT 30.4*  MCV 91.3  PLT 198    Cardiac Enzymes: No results for input(s): CKTOTAL, CKMB, CKMBINDEX, TROPONINI in the last 168 hours.  BNP (last 3 results) No results for input(s): BNP in the last 8760 hours.  ProBNP  (last 3 results) No results for input(s): PROBNP in the last 8760 hours.  Radiological Exams: Ct Abdomen Wo Contrast  Result Date: 05/30/2018 CLINICAL DATA:  Preop for gastrostomy tube EXAM: CT ABDOMEN WITHOUT CONTRAST TECHNIQUE: Multidetector CT imaging of the abdomen was performed following the standard protocol without IV contrast. COMPARISON:  10/13/2014 FINDINGS: Lower chest: Bibasilar posterior lower lobe airspace disease. Small pleural effusions. Hepatobiliary: Pneumobilia. Multiple gallstones are noted. The cyst in the right lobe of the liver is smaller. 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 Today it measures 4.2 x 8.3 cm. Previously it measured 6.8 x 3.6 cm. Right kidney and adrenal glands are within normal limits. Stomach/Bowel: The stomach is opposed to the anterior abdominal wall. There is a fistulous tract leading from the skin into the body of the stomach. The tract does contain a gas bubble in the anterior abdominal wall. Moderate stool burden in the colon. Diverticulosis of the descending colon. No evidence of small-bowel obstruction. Vascular/Lymphatic: Aortic atherosclerotic calcifications. No evidence of aneurysm. No abnormal retroperitoneal adenopathy. Other: No free fluid. Musculoskeletal: New T11 superior endplate depression with 20% loss of height. There is also a superior endplate depression at L3 with central height loss. IMPRESSION: The stomach is well opposed to the anterior abdominal wall. There is a fistulous tract leading from the skin into the body of the stomach from a prior gastrostomy tube. The tract  does contain a gas bubble in the abdominal wall. Pneumobilia. Gallstones. Bilateral lower lobe airspace disease and bilateral pleural effusions. 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 T11 and L3 compression fractures of indeterminate age. Aortic Atherosclerosis (ICD10-I70.0).  Electronically Signed   By: Marybelle Killings M.D.   On: 05/30/2018 19:34   Dg Chest Port 1 View  Result Date: 05/31/2018 CLINICAL DATA:  Shortness of breath EXAM: PORTABLE CHEST 1 VIEW COMPARISON:  05/23/2018 FINDINGS: Cardiac shadows within normal limits. Nasogastric catheter and endotracheal tube are noted in satisfactory position. Bibasilar atelectatic changes are again seen and stable. The overall inspiratory effort is poor. IMPRESSION: Tubes and lines as described above. Bibasilar atelectatic changes are again seen. Electronically Signed   By: Inez Catalina M.D.   On: 05/31/2018 09:06   Dg Abd Portable 1v  Result Date: 05/31/2018 CLINICAL DATA:  Encounter for gastric tube placement. EXAM: PORTABLE ABDOMEN - 1 VIEW COMPARISON:  None. FINDINGS: Targeted study of the abdomen for gastric tube position was provided. The left hemiabdomen and much of the pelvis was provided. A gastric tube is not identified on this study as seen from left lung base caudad to the level of the pubic rami. IMPRESSION: A gastric tube is not visualized on this study within the abdomen. Electronically Signed   By: Ashley Royalty M.D.   On: 05/31/2018 19:32    Assessment/Plan Active Problems:   Acute respiratory failure with hypoxia and hypercarbia (HCC)   COPD (chronic obstructive pulmonary disease) (HCC)   OSA (obstructive sleep apnea)   HCAP (healthcare-associated pneumonia)   Tracheostomy status (Debbie Pena)   1. Acute on chronic respiratory failure with hypoxia we will continue with full vent support at this time the mechanics are poor patient's not been able to tolerate any weaning attempts at this time is not amenable. 2. COPD severe disease we will continue care 3. Healthcare associated pneumonia with increased sputum production would recommend getting up sputum cultures done as well as looking at the tracheal by wound care 4. Tracheostomy and recommend getting a wound care assessment of her trach   I have personally seen  and evaluated the patient, evaluated laboratory and imaging results, formulated the assessment and plan and placed orders. The Patient requires high complexity decision making for assessment and support.  Case was discussed on Rounds with the Respiratory Therapy Staff  Allyne Gee, MD [facility] Pulmonary Critical Care Medicine Sleep Medicine

## 2018-06-03 DIAGNOSIS — J189 Pneumonia, unspecified organism: Secondary | ICD-10-CM | POA: Diagnosis not present

## 2018-06-03 DIAGNOSIS — J9602 Acute respiratory failure with hypercapnia: Secondary | ICD-10-CM | POA: Diagnosis not present

## 2018-06-03 DIAGNOSIS — Z930 Tracheostomy status: Secondary | ICD-10-CM | POA: Diagnosis not present

## 2018-06-03 DIAGNOSIS — J438 Other emphysema: Secondary | ICD-10-CM | POA: Diagnosis not present

## 2018-06-03 DIAGNOSIS — G4733 Obstructive sleep apnea (adult) (pediatric): Secondary | ICD-10-CM | POA: Diagnosis not present

## 2018-06-03 DIAGNOSIS — J9601 Acute respiratory failure with hypoxia: Secondary | ICD-10-CM | POA: Diagnosis not present

## 2018-06-03 LAB — CULTURE, RESPIRATORY

## 2018-06-03 LAB — CULTURE, RESPIRATORY W GRAM STAIN

## 2018-06-03 NOTE — Progress Notes (Signed)
Pulmonary [place]   PULMONARY SERVICE  PROGRESS NOTE  Date of Service: 06/03/2018  Debbie Pena  UXN:[id number]  DOB: 05/13/1946   DOA: 05/27/2018  Referring Physician: Merton Border, MD  HPI: Debbie Pena is a 72 y.o. female seen for follow up of Acute on Chronic Respiratory Failure.  Patient is still not tolerating weaning I do not think this long-term she is going to be able to wean.  Currently remains on assist control mode has been on 28% FiO2 with a PEEP of 5  Medications: Reviewed on Rounds  Physical Exam:  Vitals: Temperature 97.0 pulse 75 respiratory rate 15 blood pressure 144/70 saturations 100%  Ventilator Settings mode of ventilation assist control FiO2 20% tidal volume 411 PEEP 5  . General: Comfortable at this time . Eyes: Grossly normal lids, irises & conjunctiva . ENT: grossly tongue is normal . Neck: no obvious mass . Cardiovascular: S1 S2 normal no gallop . Respiratory: No rhonchi expansion is equal . Abdomen: soft . Skin: no rash seen on limited exam . Musculoskeletal: not rigid . Psychiatric:unable to assess . Neurologic: no seizure no involuntary movements         Lab Data:   Basic Metabolic Panel: Recent Labs  Lab 05/31/18 0733  NA 139  K 4.5  CL 107  CO2 24  GLUCOSE 136*  BUN 26*  CREATININE 0.47  CALCIUM 8.7*    Liver Function Tests: No results for input(s): AST, ALT, ALKPHOS, BILITOT, PROT, ALBUMIN in the last 168 hours. No results for input(s): LIPASE, AMYLASE in the last 168 hours. No results for input(s): AMMONIA in the last 168 hours.  CBC: Recent Labs  Lab 05/31/18 0733  WBC 10.4  HGB 9.4*  HCT 30.4*  MCV 91.3  PLT 198    Cardiac Enzymes: No results for input(s): CKTOTAL, CKMB, CKMBINDEX, TROPONINI in the last 168 hours.  BNP (last 3 results) No results for input(s): BNP in the last 8760 hours.  ProBNP (last 3 results) No results for input(s): PROBNP in the last  8760 hours.  Radiological Exams: Ir Gastrostomy Tube Mod Sed  Result Date: 06/01/2018 INDICATION: 72 year old female referred for placement of percutaneous gastrostomy EXAM: IMAGE GUIDED PERCUTANEOUS GASTROSTOMY MEDICATIONS: Vancomycin 1 gm IV; Antibiotics were administered within 1 hour of the procedure. ANESTHESIA/SEDATION: Versed 2.0 mg IV; Fentanyl 100 mcg IV Moderate Sedation Time:  10 minutes The patient was continuously monitored during the procedure by the interventional radiology nurse under my direct supervision. CONTRAST:  10 cc-administered into the gastric lumen. FLUOROSCOPY TIME:  Fluoroscopy Time: 0 minutes 48 seconds (2 mGy). COMPLICATIONS: None PROCEDURE: Informed written consent was obtained from the patient's family after a thorough discussion of the procedural risks, benefits and alternatives. All questions were addressed. Maximal Sterile Barrier Technique was utilized including caps, mask, sterile gowns, sterile gloves, sterile drape, hand hygiene and skin antiseptic. A timeout was performed prior to the initiation of the procedure. The procedure, risks, benefits, and alternatives were explained to the patient. Questions regarding the procedure were encouraged and answered. The patient understands and consents to the procedure. The epigastrium was prepped with Betadine in a sterile fashion, and a sterile drape was applied covering the operative field. A sterile gown and sterile gloves were used for the procedure. A 5-French orogastric tube is placed under fluoroscopic guidance. Scout imaging of the abdomen confirms barium within the transverse colon. The stomach was distended with gas. Under fluoroscopic guidance, an 18 gauge needle was  utilized to puncture the anterior wall of the body of the stomach. An Amplatz wire was advanced through the needle passing a T fastener into the lumen of the stomach. The T fastener was secured for gastropexy. A 9-French sheath was inserted. A snare was  advanced through the 9-French sheath. A Britta Mccreedy was advanced through the orogastric tube. It was snared then pulled out the oral cavity, pulling the snare, as well. The leading edge of the gastrostomy was attached to the snare. It was then pulled down the esophagus and out the percutaneous site. It was secured in place. Contrast was injected. No complication IMPRESSION: Status post percutaneous gastrostomy. Signed, Dulcy Fanny. Dellia Nims, RPVI [facility] Radiology Electronically Signed   By: Corrie Mckusick D.O.   On: 06/01/2018 15:46    Assessment/Plan Active Problems:   Acute respiratory failure with hypoxia and hypercarbia (HCC)   COPD (chronic obstructive pulmonary disease) (HCC)   OSA (obstructive sleep apnea)   HCAP (healthcare-associated pneumonia)   Tracheostomy status ([place])   1. Acute on chronic respiratory failure with hypoxia we will continue with full support on assist control mode titrate oxygen continue to monitor the spontaneous breathing index 2. COPD severe disease we will continue supportive care present management. 3. Healthcare associated pneumonia treated with antibiotics we will follow 4. Sleep apnea nonissue has trach in place 5. Tracheostomy drainage noted as previously.Respiratory culture was growing [place] we will discuss with primary care team regarding management   I have personally seen and evaluated the patient, evaluated laboratory and imaging results, formulated the assessment and plan and placed orders. The Patient requires high complexity decision making for assessment and support.  Case was discussed on Rounds with the Respiratory Therapy Staff  Allyne Gee, MD [facility] Pulmonary Critical Care Medicine Sleep Medicine

## 2018-06-04 ENCOUNTER — Other Ambulatory Visit (HOSPITAL_COMMUNITY): Payer: Self-pay

## 2018-06-04 DIAGNOSIS — J438 Other emphysema: Secondary | ICD-10-CM | POA: Diagnosis not present

## 2018-06-04 DIAGNOSIS — J9601 Acute respiratory failure with hypoxia: Secondary | ICD-10-CM | POA: Diagnosis not present

## 2018-06-04 DIAGNOSIS — J189 Pneumonia, unspecified organism: Secondary | ICD-10-CM | POA: Diagnosis not present

## 2018-06-04 DIAGNOSIS — G4733 Obstructive sleep apnea (adult) (pediatric): Secondary | ICD-10-CM | POA: Diagnosis not present

## 2018-06-04 NOTE — Progress Notes (Signed)
Pulmonary [place]   PULMONARY SERVICE  PROGRESS NOTE  Date of Service: 06/04/2018  Debbie Pena  WUJ:[id number]  DOB: [birth year]   DOA: 05/27/2018  Referring Physician: Merton Border, MD  HPI: Debbie Pena is a 72 y.o. female seen for follow up of Acute on Chronic Respiratory Failure.   Right now is comfortable without distress patient is been on assist-control mode and is requiring 28% oxygen with peep of 5. Patient was tried on spontaneous breathing trial today and did not tolerate.  Will reassess again tomorrow  Medications: Reviewed on Rounds  Physical Exam:  Vitals:  Temperature 97.2 degrees pulse 70 respiratory 14 blood pressure 125/65 saturations 100%  Ventilator Settings  Mode of ventilation assist-control FiO2 28% tidal volume 742 peep 5  . General: Comfortable at this time . Eyes: Grossly normal lids, irises & conjunctiva . ENT: grossly tongue is normal . Neck: no obvious mass . Cardiovascular: S1 S2 normal no gallop . Respiratory:  No rhonchi expansion is equal bilaterally . Abdomen: soft . Skin: no rash seen on limited exam . Musculoskeletal: not rigid . Psychiatric:unable to assess . Neurologic: no seizure no involuntary movements         Lab Data:   Basic Metabolic Panel: Recent Labs  Lab 05/31/18 0733  NA 139  K 4.5  CL 107  CO2 24  GLUCOSE 136*  BUN 26*  CREATININE 0.47  CALCIUM 8.7*    Liver Function Tests: No results for input(s): AST, ALT, ALKPHOS, BILITOT, PROT, ALBUMIN in the last 168 hours. No results for input(s): LIPASE, AMYLASE in the last 168 hours. No results for input(s): AMMONIA in the last 168 hours.  CBC: Recent Labs  Lab 05/31/18 0733  WBC 10.4  HGB 9.4*  HCT 30.4*  MCV 91.3  PLT 198    Cardiac Enzymes: No results for input(s): CKTOTAL, CKMB, CKMBINDEX, TROPONINI in the last 168 hours.  BNP (last 3 results) No results for input(s): BNP in the last 8760  hours.  ProBNP (last 3 results) No results for input(s): PROBNP in the last 8760 hours.  Radiological Exams: No results found.  Assessment/Plan Active Problems:   Acute respiratory failure with hypoxia and hypercarbia (HCC)   COPD (chronic obstructive pulmonary disease) (HCC)   OSA (obstructive sleep apnea)   HCAP (healthcare-associated pneumonia)   Tracheostomy status (HCC)   1.  acute on chronic Respiratory failure with hypoxia at this time patient is not tolerating weaning will assess the spontaneous breathing trial again tomorrow continue with full supportive care prognosis guarded.   2. COPD severe disease we will continue present management.   3. Healthcare associated pneumonia treated follow-up x-rays as needed 4. Tracheostomy stable  5. Sleep apnea non issue   I have personally seen and evaluated the patient, evaluated laboratory and imaging results, formulated the assessment and plan and placed orders. The Patient requires high complexity decision making for assessment and support.  Case was discussed on Rounds with the Respiratory Therapy Staff  Allyne Gee, MD [facility] Pulmonary Critical Care Medicine Sleep Medicine

## 2018-06-05 ENCOUNTER — Other Ambulatory Visit (HOSPITAL_COMMUNITY): Payer: Self-pay

## 2018-06-05 DIAGNOSIS — J438 Other emphysema: Secondary | ICD-10-CM | POA: Diagnosis not present

## 2018-06-05 DIAGNOSIS — J189 Pneumonia, unspecified organism: Secondary | ICD-10-CM | POA: Diagnosis not present

## 2018-06-05 DIAGNOSIS — J9601 Acute respiratory failure with hypoxia: Secondary | ICD-10-CM | POA: Diagnosis not present

## 2018-06-05 DIAGNOSIS — G4733 Obstructive sleep apnea (adult) (pediatric): Secondary | ICD-10-CM | POA: Diagnosis not present

## 2018-06-05 LAB — CBC
HEMATOCRIT: 31.7 % — AB (ref 36.0–46.0)
HEMOGLOBIN: 9.9 g/dL — AB (ref 12.0–15.0)
MCH: 28.1 pg (ref 26.0–34.0)
MCHC: 31.2 g/dL (ref 30.0–36.0)
MCV: 90.1 fL (ref 78.0–100.0)
Platelets: 238 10*3/uL (ref 150–400)
RBC: 3.52 MIL/uL — AB (ref 3.87–5.11)
RDW: 15.7 % — ABNORMAL HIGH (ref 11.5–15.5)
WBC: 9.3 10*3/uL (ref 4.0–10.5)

## 2018-06-05 NOTE — Progress Notes (Signed)
Pulmonary [place]   PULMONARY SERVICE  PROGRESS NOTE  Date of Service: 06/05/2018  Debbie Pena  BZJ:[id number]  DOB: September 24, 1946   DOA: 05/27/2018  Referring Physician: Merton Border, MD  HPI: Debbie Pena is a 72 y.o. female seen for follow up of Acute on Chronic Respiratory Failure.  Currently patient is on assist control mode has been on 28% oxygen good saturations are noted.  Patient was able to do about 2 hours on pressure support today  Medications: Reviewed on Rounds  Physical Exam:  Vitals: Temperature 98.2 pulse 64 respiratory rate 12 blood pressure 124/67 saturations 100%  Ventilator Settings mode of ventilation assist control FiO2 28% tidal volume 398 PEEP 5  . General: Comfortable at this time . Eyes: Grossly normal lids, irises & conjunctiva . ENT: grossly tongue is normal . Neck: no obvious mass . Cardiovascular: S1 S2 normal no gallop . Respiratory: No rhonchi noted at this time . Abdomen: soft . Skin: no rash seen on limited exam . Musculoskeletal: not rigid . Psychiatric:unable to assess . Neurologic: no seizure no involuntary movements         Lab Data:   Basic Metabolic Panel: Recent Labs  Lab 05/31/18 0733  NA 139  K 4.5  CL 107  CO2 24  GLUCOSE 136*  BUN 26*  CREATININE 0.47  CALCIUM 8.7*    Liver Function Tests: No results for input(s): AST, ALT, ALKPHOS, BILITOT, PROT, ALBUMIN in the last 168 hours. No results for input(s): LIPASE, AMYLASE in the last 168 hours. No results for input(s): AMMONIA in the last 168 hours.  CBC: Recent Labs  Lab 05/31/18 0733 06/05/18 0827  WBC 10.4 9.3  HGB 9.4* 9.9*  HCT 30.4* 31.7*  MCV 91.3 90.1  PLT 198 238    Cardiac Enzymes: No results for input(s): CKTOTAL, CKMB, CKMBINDEX, TROPONINI in the last 168 hours.  BNP (last 3 results) No results for input(s): BNP in the last 8760 hours.  ProBNP (last 3 results) No results for input(s):  PROBNP in the last 8760 hours.  Radiological Exams: Dg Chest Port 1 View  Result Date: 06/05/2018 CLINICAL DATA:  Respiratory failure EXAM: PORTABLE CHEST 1 VIEW COMPARISON:  Chest radiograph 05/31/2018 FINDINGS: Tracheostomy tube terminates in the mid trachea. Interval removal enteric tube. Monitoring leads overlie the patient. Stable cardiomegaly. Low lung volumes. Stable bibasilar atelectasis. Trace bilateral pleural effusions. No pneumothorax. IMPRESSION: Low lung volumes with basilar atelectasis. Electronically Signed   By: Lovey Newcomer M.D.   On: 06/05/2018 13:44    Assessment/Plan Active Problems:   Acute respiratory failure with hypoxia and hypercarbia (HCC)   COPD (chronic obstructive pulmonary disease) (HCC)   OSA (obstructive sleep apnea)   HCAP (healthcare-associated pneumonia)   Tracheostomy status ([place])   1. Acute on chronic respiratory failure with hypoxia we will continue with the attempts at weaning as noted already patient did about 2 hours of pressure support will reassess again tomorrow continue pulmonary toilet secretion management supportive care 2. COPD severe disease prognosis guarded 3. Healthcare associated pneumonia treated with antibiotics we will continue present management 4. Status post tracheostomy no active bleeding we will continue to follow 5. Sleep apnea nonissue at this time   I have personally seen and evaluated the patient, evaluated laboratory and imaging results, formulated the assessment and plan and placed orders. The Patient requires high complexity decision making for assessment and support.  Case was discussed on Rounds with the Respiratory Therapy Staff  Allyne Gee, MD [facility] Pulmonary Critical Care Medicine Sleep Medicine

## 2018-06-06 DIAGNOSIS — J438 Other emphysema: Secondary | ICD-10-CM | POA: Diagnosis not present

## 2018-06-06 DIAGNOSIS — J189 Pneumonia, unspecified organism: Secondary | ICD-10-CM | POA: Diagnosis not present

## 2018-06-06 DIAGNOSIS — J9601 Acute respiratory failure with hypoxia: Secondary | ICD-10-CM | POA: Diagnosis not present

## 2018-06-06 DIAGNOSIS — G4733 Obstructive sleep apnea (adult) (pediatric): Secondary | ICD-10-CM | POA: Diagnosis not present

## 2018-06-06 NOTE — Progress Notes (Signed)
Pulmonary [place]   PULMONARY SERVICE  PROGRESS NOTE  Date of Service: 06/06/2018  TANYIAH LAURICH  PZW:[id number]  DOB: 1946/08/23   DOA: 05/27/2018  Referring Physician: Merton Border, MD  HPI: Debbie Pena is a 72 y.o. female seen for follow up of Acute on Chronic Respiratory Failure.  Patient right now is on full support has been on assist control mode failing weaning again.  Right now is on 28% oxygen with a PEEP of 5  Medications: Reviewed on Rounds  Physical Exam:  Vitals: Temperature 97.2 pulse 62 respiratory rate 35 blood pressure 156/87 saturations 100%  Ventilator Settings mode of ventilation assist control FiO2 28% tidal volume 467 PEEP 5  . General: Comfortable at this time . Eyes: Grossly normal lids, irises & conjunctiva . ENT: grossly tongue is normal . Neck: no obvious mass . Cardiovascular: S1 S2 normal no gallop . Respiratory: No rhonchi or rales at this time . Abdomen: soft . Skin: no rash seen on limited exam . Musculoskeletal: not rigid . Psychiatric:unable to assess . Neurologic: no seizure no involuntary movements         Lab Data:   Basic Metabolic Panel: Recent Labs  Lab 05/31/18 0733  NA 139  K 4.5  CL 107  CO2 24  GLUCOSE 136*  BUN 26*  CREATININE 0.47  CALCIUM 8.7*    Liver Function Tests: No results for input(s): AST, ALT, ALKPHOS, BILITOT, PROT, ALBUMIN in the last 168 hours. No results for input(s): LIPASE, AMYLASE in the last 168 hours. No results for input(s): AMMONIA in the last 168 hours.  CBC: Recent Labs  Lab 05/31/18 0733 06/05/18 0827  WBC 10.4 9.3  HGB 9.4* 9.9*  HCT 30.4* 31.7*  MCV 91.3 90.1  PLT 198 238    Cardiac Enzymes: No results for input(s): CKTOTAL, CKMB, CKMBINDEX, TROPONINI in the last 168 hours.  BNP (last 3 results) No results for input(s): BNP in the last 8760 hours.  ProBNP (last 3 results) No results for input(s): PROBNP in the last 8760  hours.  Radiological Exams: Dg Chest Port 1 View  Result Date: 06/05/2018 CLINICAL DATA:  Respiratory failure EXAM: PORTABLE CHEST 1 VIEW COMPARISON:  Chest radiograph 05/31/2018 FINDINGS: Tracheostomy tube terminates in the mid trachea. Interval removal enteric tube. Monitoring leads overlie the patient. Stable cardiomegaly. Low lung volumes. Stable bibasilar atelectasis. Trace bilateral pleural effusions. No pneumothorax. IMPRESSION: Low lung volumes with basilar atelectasis. Electronically Signed   By: Lovey Newcomer M.D.   On: 06/05/2018 13:44    Assessment/Plan Active Problems:   Acute respiratory failure with hypoxia and hypercarbia (HCC)   COPD (chronic obstructive pulmonary disease) (HCC)   OSA (obstructive sleep apnea)   HCAP (healthcare-associated pneumonia)   Tracheostomy status ([place])   1. Acute on chronic respiratory failure with hypoxia we will continue with full vent support the mechanics have been poor unless far patient has not been able to tolerate weaning. 2. COPD severe disease limiting factor for weaning. 3. OSA nonissue 4. Healthcare associated pneumonia has been treated we will continue with present management. 5. Tracheostomy status remains in place we will continue to follow   I have personally seen and evaluated the patient, evaluated laboratory and imaging results, formulated the assessment and plan and placed orders. The Patient requires high complexity decision making for assessment and support.  Case was discussed on Rounds with the Respiratory Therapy Staff  Allyne Gee, MD [facility] Pulmonary Critical Care Medicine  Sleep Medicine

## 2018-06-07 DIAGNOSIS — J9601 Acute respiratory failure with hypoxia: Secondary | ICD-10-CM | POA: Diagnosis not present

## 2018-06-07 DIAGNOSIS — G4733 Obstructive sleep apnea (adult) (pediatric): Secondary | ICD-10-CM | POA: Diagnosis not present

## 2018-06-07 DIAGNOSIS — J189 Pneumonia, unspecified organism: Secondary | ICD-10-CM | POA: Diagnosis not present

## 2018-06-07 DIAGNOSIS — J438 Other emphysema: Secondary | ICD-10-CM | POA: Diagnosis not present

## 2018-06-07 NOTE — Progress Notes (Signed)
Pulmonary [place]   PULMONARY SERVICE  PROGRESS NOTE  Date of Service: 06/07/2018  Debbie Pena  DPO:[id number]  DOB: [birth year]   DOA: 05/27/2018  Referring Physician: Merton Border, MD  HPI: Debbie Pena is a 72 y.o. female seen for follow up of Acute on Chronic Respiratory Failure.  She is on pressure support wean today the goal is to try for about 4 hr.  Has been tolerating it fairly well right now.  Medications: Reviewed on Rounds  Physical Exam:  Vitals:   Temperature 98.0 degrees pulse 72 respiratory 20 blood pressure 116/66 saturations 100%  Ventilator Settings  Mode of ventilation pressure support FiO2 28% tidal volume 292 peep 5 pressure support 12  . General: Comfortable at this time . Eyes: Grossly normal lids, irises & conjunctiva . ENT: grossly tongue is normal . Neck: no obvious mass . Cardiovascular: S1 S2 normal no gallop . Respiratory:  Coarse rhonchi were noted . Abdomen: soft . Skin: no rash seen on limited exam . Musculoskeletal: not rigid . Psychiatric:unable to assess . Neurologic: no seizure no involuntary movements         Lab Data:   Basic Metabolic Panel: No results for input(s): NA, K, CL, CO2, GLUCOSE, BUN, CREATININE, CALCIUM, MG, PHOS in the last 168 hours.  Liver Function Tests: No results for input(s): AST, ALT, ALKPHOS, BILITOT, PROT, ALBUMIN in the last 168 hours. No results for input(s): LIPASE, AMYLASE in the last 168 hours. No results for input(s): AMMONIA in the last 168 hours.  CBC: Recent Labs  Lab 06/05/18 0827  WBC 9.3  HGB 9.9*  HCT 31.7*  MCV 90.1  PLT 238    Cardiac Enzymes: No results for input(s): CKTOTAL, CKMB, CKMBINDEX, TROPONINI in the last 168 hours.  BNP (last 3 results) No results for input(s): BNP in the last 8760 hours.  ProBNP (last 3 results) No results for input(s): PROBNP in the last 8760 hours.  Radiological Exams: No results  found.  Assessment/Plan Active Problems:   Acute respiratory failure with hypoxia and hypercarbia (HCC)   COPD (chronic obstructive pulmonary disease) (HCC)   OSA (obstructive sleep apnea)   HCAP (healthcare-associated pneumonia)   Tracheostomy status (HCC)   1.  acute on chronic Respiratory failure with hypoxia she continues to wean on pressure support as mention goal is 4 hr.  We will continue to wean continue secretion management pulmonary toilet patient overall prognosis guarded.   2. COPD severe disease we will continue with present management.   3. Obstructive sleep apnea non issue 4. Healthcare associated pneumonia treated with antibiotics we will monitor  5. Tracheostomy stable   I have personally seen and evaluated the patient, evaluated laboratory and imaging results, formulated the assessment and plan and placed orders. The Patient requires high complexity decision making for assessment and support.  Case was discussed on Rounds with the Respiratory Therapy Staff  Allyne Gee, MD [facility] Pulmonary Critical Care Medicine Sleep Medicine

## 2018-06-08 DIAGNOSIS — J9601 Acute respiratory failure with hypoxia: Secondary | ICD-10-CM | POA: Diagnosis not present

## 2018-06-08 DIAGNOSIS — J438 Other emphysema: Secondary | ICD-10-CM | POA: Diagnosis not present

## 2018-06-08 DIAGNOSIS — J189 Pneumonia, unspecified organism: Secondary | ICD-10-CM | POA: Diagnosis not present

## 2018-06-08 DIAGNOSIS — G4733 Obstructive sleep apnea (adult) (pediatric): Secondary | ICD-10-CM | POA: Diagnosis not present

## 2018-06-08 NOTE — Progress Notes (Signed)
Pulmonary [place]   PULMONARY SERVICE  PROGRESS NOTE  Date of Service: 06/08/2018  Debbie Pena  QAS:[id number]  DOB: 02-07-1946   DOA: 05/27/2018  Referring Physician: Merton Border, MD  HPI: Debbie Pena is a 72 y.o. female seen for follow up of Acute on Chronic Respiratory Failure.  Patient is comfortable with no distress has been on pressure support weaning so far is tolerating it well.  Medications: Reviewed on Rounds  Physical Exam:  Vitals: Temperature 97.9 pulse 76 respiratory rate 24 blood pressure 122/68 saturations 100%  Ventilator Settings on pressure support FiO2 28% per support 12/5 tidal volume 300  . General: Comfortable at this time . Eyes: Grossly normal lids, irises & conjunctiva . ENT: grossly tongue is normal . Neck: no obvious mass . Cardiovascular: S1 S2 normal no gallop . Respiratory: No rhonchi or rales are noted . Abdomen: soft . Skin: no rash seen on limited exam . Musculoskeletal: not rigid . Psychiatric:unable to assess . Neurologic: no seizure no involuntary movements         Lab Data:   Basic Metabolic Panel: No results for input(s): NA, K, CL, CO2, GLUCOSE, BUN, CREATININE, CALCIUM, MG, PHOS in the last 168 hours.  Liver Function Tests: No results for input(s): AST, ALT, ALKPHOS, BILITOT, PROT, ALBUMIN in the last 168 hours. No results for input(s): LIPASE, AMYLASE in the last 168 hours. No results for input(s): AMMONIA in the last 168 hours.  CBC: Recent Labs  Lab 06/05/18 0827  WBC 9.3  HGB 9.9*  HCT 31.7*  MCV 90.1  PLT 238    Cardiac Enzymes: No results for input(s): CKTOTAL, CKMB, CKMBINDEX, TROPONINI in the last 168 hours.  BNP (last 3 results) No results for input(s): BNP in the last 8760 hours.  ProBNP (last 3 results) No results for input(s): PROBNP in the last 8760 hours.  Radiological Exams: No results found.  Assessment/Plan Active Problems:   Acute  respiratory failure with hypoxia and hypercarbia (HCC)   COPD (chronic obstructive pulmonary disease) (HCC)   OSA (obstructive sleep apnea)   HCAP (healthcare-associated pneumonia)   Tracheostomy status ([place])   1. Acute on chronic respiratory failure with hypoxia we will continue with weaning on pressure support the goal is for 8 hours.  Continue secretion management pulmonary toilet prognosis guarded. 2. COPD severe disease we will continue present management. 3. Associated pneumonia treated we will monitor 4. Tracheostomy status stable 5. Sleep apnea evaluation   I have personally seen and evaluated the patient, evaluated laboratory and imaging results, formulated the assessment and plan and placed orders. The Patient requires high complexity decision making for assessment and support.  Case was discussed on Rounds with the Respiratory Therapy Staff  Allyne Gee, MD [facility] Pulmonary Critical Care Medicine Sleep Medicine

## 2018-06-11 LAB — CBC
HCT: 31.2 % — ABNORMAL LOW (ref 36.0–46.0)
Hemoglobin: 9.6 g/dL — ABNORMAL LOW (ref 12.0–15.0)
MCH: 27.8 pg (ref 26.0–34.0)
MCHC: 30.8 g/dL (ref 30.0–36.0)
MCV: 90.4 fL (ref 78.0–100.0)
PLATELETS: 221 10*3/uL (ref 150–400)
RBC: 3.45 MIL/uL — ABNORMAL LOW (ref 3.87–5.11)
RDW: 15.7 % — ABNORMAL HIGH (ref 11.5–15.5)
WBC: 13.6 10*3/uL — ABNORMAL HIGH (ref 4.0–10.5)

## 2018-06-11 LAB — BASIC METABOLIC PANEL
Anion gap: 9 (ref 5–15)
BUN: 29 mg/dL — ABNORMAL HIGH (ref 8–23)
CALCIUM: 9.1 mg/dL (ref 8.9–10.3)
CO2: 28 mmol/L (ref 22–32)
CREATININE: 0.5 mg/dL (ref 0.44–1.00)
Chloride: 102 mmol/L (ref 98–111)
GFR calc Af Amer: >60 mL/min (ref >60)
Glucose, Bld: 185 mg/dL — ABNORMAL HIGH (ref 70–99)
Potassium: 4.6 mmol/L (ref 3.5–5.1)
SODIUM: 139 mmol/L (ref 135–145)

## 2018-06-11 LAB — MAGNESIUM: MAGNESIUM: 1.9 mg/dL (ref 1.7–2.4)

## 2018-06-13 DIAGNOSIS — J189 Pneumonia, unspecified organism: Secondary | ICD-10-CM | POA: Diagnosis not present

## 2018-06-13 DIAGNOSIS — Z930 Tracheostomy status: Secondary | ICD-10-CM | POA: Diagnosis not present

## 2018-06-13 DIAGNOSIS — J9602 Acute respiratory failure with hypercapnia: Secondary | ICD-10-CM | POA: Diagnosis not present

## 2018-06-13 DIAGNOSIS — J9601 Acute respiratory failure with hypoxia: Secondary | ICD-10-CM | POA: Diagnosis not present

## 2018-06-13 DIAGNOSIS — G4733 Obstructive sleep apnea (adult) (pediatric): Secondary | ICD-10-CM | POA: Diagnosis not present

## 2018-06-13 DIAGNOSIS — J438 Other emphysema: Secondary | ICD-10-CM | POA: Diagnosis not present

## 2018-06-13 NOTE — Progress Notes (Signed)
Pulmonary [place]   PULMONARY SERVICE  PROGRESS NOTE  Date of Service: 06/13/2018  Debbie Pena  [id number]  DOB: [birth year]   DOA: 05/27/2018  Referring Physician: Merton Border, MD  HPI: Debbie Pena is a 72 y.o. female seen for follow up of Acute on Chronic Respiratory Failure.  Patient is on T collar right now has been on 20% oxygen.  Has been off the ventilator for more than 48 hours and is tolerating it well  Medications: Reviewed on Rounds  Physical Exam:  Vitals: Temperature 96.4 pulse 72 respiratory rate 26 blood pressure 108/61 saturations 98%  Ventilator Settings off the ventilator On T collar 28%  . General: Comfortable at this time . Eyes: Grossly normal lids, irises & conjunctiva . ENT: grossly tongue is normal . Neck: no obvious mass . Cardiovascular: S1 S2 normal no gallop . Respiratory: No rhonchi rales are noted at this time . Abdomen: soft . Skin: no rash seen on limited exam . Musculoskeletal: not rigid . Psychiatric:unable to assess . Neurologic: no seizure no involuntary movements         Lab Data:   Basic Metabolic Panel: Recent Labs  Lab 06/11/18 0557  NA 139  K 4.6  CL 102  CO2 28  GLUCOSE 185*  BUN 29*  CREATININE 0.50  CALCIUM 9.1  MG 1.9    Liver Function Tests: No results for input(s): AST, ALT, ALKPHOS, BILITOT, PROT, ALBUMIN in the last 168 hours. No results for input(s): LIPASE, AMYLASE in the last 168 hours. No results for input(s): AMMONIA in the last 168 hours.  CBC: Recent Labs  Lab 06/11/18 0557  WBC 13.6*  HGB 9.6*  HCT 31.2*  MCV 90.4  PLT 221    Cardiac Enzymes: No results for input(s): CKTOTAL, CKMB, CKMBINDEX, TROPONINI in the last 168 hours.  BNP (last 3 results) No results for input(s): BNP in the last 8760 hours.  ProBNP (last 3 results) No results for input(s): PROBNP in the last 8760 hours.  Radiological Exams: No results  found.  Assessment/Plan Active Problems:   Acute respiratory failure with hypoxia and hypercarbia (HCC)   COPD (chronic obstructive pulmonary disease) (HCC)   OSA (obstructive sleep apnea)   HCAP (healthcare-associated pneumonia)   Tracheostomy status ([place])   1. Acute on chronic respiratory failure with hypoxia we will continue with T collar trials continue secretion management pulmonary toilet patient is had more than 48 hours next step will be to hopefully change the trach and then begin PMV capping 2. COPD severe disease we will continue present management. 3. Sleep apnea at baseline we will need to see how patient does if capping is attempted 4. Healthcare associated pneumonia treated 5. Tracheostomy stable at this time   I have personally seen and evaluated the patient, evaluated laboratory and imaging results, formulated the assessment and plan and placed orders. The Patient requires high complexity decision making for assessment and support.  Case was discussed on Rounds with the Respiratory Therapy Staff  Allyne Gee, MD [facility] Pulmonary Critical Care Medicine Sleep Medicine

## 2018-06-14 DIAGNOSIS — J9601 Acute respiratory failure with hypoxia: Secondary | ICD-10-CM | POA: Diagnosis not present

## 2018-06-14 DIAGNOSIS — Z930 Tracheostomy status: Secondary | ICD-10-CM | POA: Diagnosis not present

## 2018-06-14 DIAGNOSIS — J189 Pneumonia, unspecified organism: Secondary | ICD-10-CM | POA: Diagnosis not present

## 2018-06-14 DIAGNOSIS — J9602 Acute respiratory failure with hypercapnia: Secondary | ICD-10-CM | POA: Diagnosis not present

## 2018-06-14 DIAGNOSIS — G4733 Obstructive sleep apnea (adult) (pediatric): Secondary | ICD-10-CM | POA: Diagnosis not present

## 2018-06-14 DIAGNOSIS — J438 Other emphysema: Secondary | ICD-10-CM | POA: Diagnosis not present

## 2018-06-14 NOTE — Progress Notes (Signed)
Pulmonary [place]   PULMONARY SERVICE  PROGRESS NOTE  Date of Service: 06/14/2018  HYDEE FLEECE  [id number]  DOB: 1946-05-31   DOA: 05/27/2018  Referring Physician: Merton Border, MD  HPI: Debbie MAULTSBY is a 72 y.o. female seen for follow up of Acute on Chronic Respiratory Failure.  Patient is on T collar has been on 28% oxygen tolerating the PMV also scheduled for a swallowing study  Medications: Reviewed on Rounds  Physical Exam:  Vitals: Temperature 98.3 pulse 88 respiratory 19 blood pressure 110/65 saturation 100%  Ventilator Settings off the ventilator on T collar  . General: Comfortable at this time . Eyes: Grossly normal lids, irises & conjunctiva . ENT: grossly tongue is normal . Neck: no obvious mass . Cardiovascular: S1 S2 normal no gallop . Respiratory: No rhonchi or rales . Abdomen: soft . Skin: no rash seen on limited exam . Musculoskeletal: not rigid . Psychiatric:unable to assess . Neurologic: no seizure no involuntary movements         Lab Data:   Basic Metabolic Panel: Recent Labs  Lab 06/11/18 0557  NA 139  K 4.6  CL 102  CO2 28  GLUCOSE 185*  BUN 29*  CREATININE 0.50  CALCIUM 9.1  MG 1.9    Liver Function Tests: No results for input(s): AST, ALT, ALKPHOS, BILITOT, PROT, ALBUMIN in the last 168 hours. No results for input(s): LIPASE, AMYLASE in the last 168 hours. No results for input(s): AMMONIA in the last 168 hours.  CBC: Recent Labs  Lab 06/11/18 0557  WBC 13.6*  HGB 9.6*  HCT 31.2*  MCV 90.4  PLT 221    Cardiac Enzymes: No results for input(s): CKTOTAL, CKMB, CKMBINDEX, TROPONINI in the last 168 hours.  BNP (last 3 results) No results for input(s): BNP in the last 8760 hours.  ProBNP (last 3 results) No results for input(s): PROBNP in the last 8760 hours.  Radiological Exams: No results found.  Assessment/Plan Active Problems:   Acute respiratory failure with  hypoxia and hypercarbia (HCC)   COPD (chronic obstructive pulmonary disease) (HCC)   OSA (obstructive sleep apnea)   HCAP (healthcare-associated pneumonia)   Tracheostomy status ([place])   1. Acute on chronic respiratory failure with hypoxia we will continue with T collar trials continue secretion management pulmonary toilet has been tolerating PMV as noted above. 2. COPD severe disease we will continue present management 3. Healthcare associated pneumonia treated follow-up x-ray as necessary 4. Tracheostomy status continue present management awaiting swallowing assessment   I have personally seen and evaluated the patient, evaluated laboratory and imaging results, formulated the assessment and plan and placed orders. The Patient requires high complexity decision making for assessment and support.  Case was discussed on Rounds with the Respiratory Therapy Staff  Allyne Gee, MD [facility] Pulmonary Critical Care Medicine Sleep Medicine

## 2018-06-15 DIAGNOSIS — Z930 Tracheostomy status: Secondary | ICD-10-CM | POA: Diagnosis not present

## 2018-06-15 DIAGNOSIS — J189 Pneumonia, unspecified organism: Secondary | ICD-10-CM | POA: Diagnosis not present

## 2018-06-15 DIAGNOSIS — J438 Other emphysema: Secondary | ICD-10-CM | POA: Diagnosis not present

## 2018-06-15 DIAGNOSIS — J9602 Acute respiratory failure with hypercapnia: Secondary | ICD-10-CM | POA: Diagnosis not present

## 2018-06-15 DIAGNOSIS — G4733 Obstructive sleep apnea (adult) (pediatric): Secondary | ICD-10-CM | POA: Diagnosis not present

## 2018-06-15 DIAGNOSIS — J9601 Acute respiratory failure with hypoxia: Secondary | ICD-10-CM | POA: Diagnosis not present

## 2018-06-15 LAB — BASIC METABOLIC PANEL
Anion gap: 12 (ref 5–15)
BUN: 34 mg/dL — ABNORMAL HIGH (ref 8–23)
CHLORIDE: 95 mmol/L — AB (ref 98–111)
CO2: 30 mmol/L (ref 22–32)
Calcium: 9.5 mg/dL (ref 8.9–10.3)
Creatinine, Ser: 0.53 mg/dL (ref 0.44–1.00)
GFR calc non Af Amer: >60 mL/min (ref >60)
Glucose, Bld: 109 mg/dL — ABNORMAL HIGH (ref 70–99)
POTASSIUM: 5.7 mmol/L — AB (ref 3.5–5.1)
SODIUM: 137 mmol/L (ref 135–145)

## 2018-06-15 LAB — CBC
HEMATOCRIT: 36.4 % (ref 36.0–46.0)
HEMOGLOBIN: 11.1 g/dL — AB (ref 12.0–15.0)
MCH: 28.2 pg (ref 26.0–34.0)
MCHC: 30.5 g/dL (ref 30.0–36.0)
MCV: 92.4 fL (ref 78.0–100.0)
Platelets: 204 10*3/uL (ref 150–400)
RBC: 3.94 MIL/uL (ref 3.87–5.11)
RDW: 15.1 % (ref 11.5–15.5)
WBC: 11.2 10*3/uL — ABNORMAL HIGH (ref 4.0–10.5)

## 2018-06-15 NOTE — Progress Notes (Signed)
Pulmonary [place]   PULMONARY SERVICE  PROGRESS NOTE  Date of Service: 06/15/2018  KATRINKA HERBISON  [id number]  DOB: 1946/04/09   DOA: 05/27/2018  Referring Physician: Merton Border, MD  HPI: Debbie Pena is a 72 y.o. female seen for follow up of Acute on Chronic Respiratory Failure.  Currently is on T collar has been on 28% FiO2.  Saturations are excellent on the 28% FiO2  Medications: Reviewed on Rounds  Physical Exam:  Vitals: Temperature 97.4 pulse 84 respiratory rate 19 blood pressure 126/70 saturations 100%  Ventilator Settings off the ventilator on T collar  . General: Comfortable at this time . Eyes: Grossly normal lids, irises & conjunctiva . ENT: grossly tongue is normal . Neck: no obvious mass . Cardiovascular: S1 S2 normal no gallop . Respiratory: Coarse rhonchi noted . Abdomen: soft . Skin: no rash seen on limited exam . Musculoskeletal: not rigid . Psychiatric:unable to assess . Neurologic: no seizure no involuntary movements         Lab Data:   Basic Metabolic Panel: Recent Labs  Lab 06/11/18 0557 06/15/18 0647  NA 139 137  K 4.6 5.7*  CL 102 95*  CO2 28 30  GLUCOSE 185* 109*  BUN 29* 34*  CREATININE 0.50 0.53  CALCIUM 9.1 9.5  MG 1.9  --     Liver Function Tests: No results for input(s): AST, ALT, ALKPHOS, BILITOT, PROT, ALBUMIN in the last 168 hours. No results for input(s): LIPASE, AMYLASE in the last 168 hours. No results for input(s): AMMONIA in the last 168 hours.  CBC: Recent Labs  Lab 06/11/18 0557 06/15/18 0756  WBC 13.6* 11.2*  HGB 9.6* 11.1*  HCT 31.2* 36.4  MCV 90.4 92.4  PLT 221 204    Cardiac Enzymes: No results for input(s): CKTOTAL, CKMB, CKMBINDEX, TROPONINI in the last 168 hours.  BNP (last 3 results) No results for input(s): BNP in the last 8760 hours.  ProBNP (last 3 results) No results for input(s): PROBNP in the last 8760 hours.  Radiological  Exams: No results found.  Assessment/Plan Active Problems:   Acute respiratory failure with hypoxia and hypercarbia (HCC)   COPD (chronic obstructive pulmonary disease) (HCC)   OSA (obstructive sleep apnea)   HCAP (healthcare-associated pneumonia)   Tracheostomy status ([place])   1. Acute on chronic respiratory failure with hypoxia she remains on T collar doing fairly well.  Went to try to downsize her tracheostomy to a cuffless #4 trach.  She may actually need to have an ENT evaluation for her vocal cords.  We will continue with supportive care at this time. 2. COPD severe disease we will continue present management. 3. Healthcare associated pneumonia treated we will monitor 4. Tracheostomy as mentioned above we will downsize   I have personally seen and evaluated the patient, evaluated laboratory and imaging results, formulated the assessment and plan and placed orders. The Patient requires high complexity decision making for assessment and support.  Case was discussed on Rounds with the Respiratory Therapy Staff  Allyne Gee, MD [facility] Pulmonary Critical Care Medicine Sleep Medicine

## 2018-06-16 DIAGNOSIS — G4733 Obstructive sleep apnea (adult) (pediatric): Secondary | ICD-10-CM | POA: Diagnosis not present

## 2018-06-16 DIAGNOSIS — J9601 Acute respiratory failure with hypoxia: Secondary | ICD-10-CM | POA: Diagnosis not present

## 2018-06-16 DIAGNOSIS — J438 Other emphysema: Secondary | ICD-10-CM | POA: Diagnosis not present

## 2018-06-16 DIAGNOSIS — J189 Pneumonia, unspecified organism: Secondary | ICD-10-CM | POA: Diagnosis not present

## 2018-06-16 LAB — POTASSIUM: POTASSIUM: 5.1 mmol/L (ref 3.5–5.1)

## 2018-06-16 NOTE — Progress Notes (Signed)
Pulmonary [place]   PULMONARY SERVICE  PROGRESS NOTE  Date of Service: 06/16/2018  Debbie Pena  [id number]:[id number]  DOB: 05-18-1946   DOA: 05/27/2018  Referring Physician: Merton Border, MD  HPI: Debbie Pena is a 72 y.o. female seen for follow up of Acute on Chronic Respiratory Failure.  Currently is on T collar no distress at this time patient has been on 28% FiO2.  Good saturations are noted  Medications: Reviewed on Rounds  Physical Exam:  Vitals:  Temperature 97.2 degrees pulse 84 respiratory 14 blood pressure 131/64 saturations are 100%  Ventilator Settings  Currently is on T collar 28% FiO2  . General: Comfortable at this time . Eyes: Grossly normal lids, irises & conjunctiva . ENT: grossly tongue is normal . Neck: no obvious mass . Cardiovascular: S1 S2 normal no gallop . Respiratory:  No rhonchi or rales . Abdomen: soft . Skin: no rash seen on limited exam . Musculoskeletal: not rigid . Psychiatric:unable to assess . Neurologic: no seizure no involuntary movements         Lab Data:   Basic Metabolic Panel: Recent Labs  Lab 06/11/18 0557 06/15/18 0647  NA 139 137  K 4.6 5.7*  CL 102 95*  CO2 28 30  GLUCOSE 185* 109*  BUN 29* 34*  CREATININE 0.50 0.53  CALCIUM 9.1 9.5  MG 1.9  --     Liver Function Tests: No results for input(s): AST, ALT, ALKPHOS, BILITOT, PROT, ALBUMIN in the last 168 hours. No results for input(s): LIPASE, AMYLASE in the last 168 hours. No results for input(s): AMMONIA in the last 168 hours.  CBC: Recent Labs  Lab 06/11/18 0557 06/15/18 0756  WBC 13.6* 11.2*  HGB 9.6* 11.1*  HCT 31.2* 36.4  MCV 90.4 92.4  PLT 221 204    Cardiac Enzymes: No results for input(s): CKTOTAL, CKMB, CKMBINDEX, TROPONINI in the last 168 hours.  BNP (last 3 results) No results for input(s): BNP in the last 8760 hours.  ProBNP (last 3 results) No results for input(s): PROBNP in the last 8760  hours.  Radiological Exams: No results found.  Assessment/Plan Active Problems:   Acute respiratory failure with hypoxia and hypercarbia (HCC)   COPD (chronic obstructive pulmonary disease) (HCC)   OSA (obstructive sleep apnea)   HCAP (healthcare-associated pneumonia)   Tracheostomy status (HCC)   1.  acute on chronic Respiratory failure with hypoxia will continue with T collar.  Continue supportive care continue aggressive pulmonary toilet.   2. COPD severe disease we will continue with present management.   3. Healthcare associated pneumonia treated Will continue supportive care.   4. Tracheostomy status looks like she may not be able to be decannulated because upper airway issues.  ENT consultation was requested   I have personally seen and evaluated the patient, evaluated laboratory and imaging results, formulated the assessment and plan and placed orders. The Patient requires high complexity decision making for assessment and support.  Case was discussed on Rounds with the Respiratory Therapy Staff  Allyne Gee, MD [facility] Pulmonary Critical Care Medicine Sleep Medicine

## 2018-06-17 DIAGNOSIS — J438 Other emphysema: Secondary | ICD-10-CM | POA: Diagnosis not present

## 2018-06-17 DIAGNOSIS — J9601 Acute respiratory failure with hypoxia: Secondary | ICD-10-CM | POA: Diagnosis not present

## 2018-06-17 DIAGNOSIS — G4733 Obstructive sleep apnea (adult) (pediatric): Secondary | ICD-10-CM | POA: Diagnosis not present

## 2018-06-17 DIAGNOSIS — J189 Pneumonia, unspecified organism: Secondary | ICD-10-CM | POA: Diagnosis not present

## 2018-06-17 LAB — POTASSIUM: Potassium: 6 mmol/L — ABNORMAL HIGH (ref 3.5–5.1)

## 2018-06-17 NOTE — Progress Notes (Signed)
Pulmonary [place]   PULMONARY SERVICE  PROGRESS NOTE  Date of Service: 06/17/2018  Debbie Pena  [id number]  DOB: [birth year]   DOA: 05/27/2018  Referring Physician: Merton Border, MD  HPI: Debbie Pena is a 72 y.o. female seen for follow up of Acute on Chronic Respiratory Failure.  Patient remains on T collar.  Has been doing well with the settings for tracheostomy in place.  Medications: Reviewed on Rounds  Physical Exam:  Vitals: Temperature 97.2 pulse 92 respiratory rate 24 blood pressure 131/75 saturations 98%  Ventilator Settings  . General: Comfortable at this time . Eyes: Grossly normal lids, irises & conjunctiva . ENT: grossly tongue is normal . Neck: no obvious mass . Cardiovascular: S1 S2 normal no gallop . Respiratory: Coarse breath sounds . Abdomen: soft . Skin: no rash seen on limited exam . Musculoskeletal: not rigid . Psychiatric:unable to assess . Neurologic: no seizure no involuntary movements         Lab Data:   Basic Metabolic Panel: Recent Labs  Lab 06/11/18 0557 06/15/18 0647 06/16/18 2112 06/17/18 0358  NA 139 137  --   --   K 4.6 5.7* 5.1 6.0*  CL 102 95*  --   --   CO2 28 30  --   --   GLUCOSE 185* 109*  --   --   BUN 29* 34*  --   --   CREATININE 0.50 0.53  --   --   CALCIUM 9.1 9.5  --   --   MG 1.9  --   --   --     Liver Function Tests: No results for input(s): AST, ALT, ALKPHOS, BILITOT, PROT, ALBUMIN in the last 168 hours. No results for input(s): LIPASE, AMYLASE in the last 168 hours. No results for input(s): AMMONIA in the last 168 hours.  CBC: Recent Labs  Lab 06/11/18 0557 06/15/18 0756  WBC 13.6* 11.2*  HGB 9.6* 11.1*  HCT 31.2* 36.4  MCV 90.4 92.4  PLT 221 204    Cardiac Enzymes: No results for input(s): CKTOTAL, CKMB, CKMBINDEX, TROPONINI in the last 168 hours.  BNP (last 3 results) No results for input(s): BNP in the last 8760 hours.  ProBNP (last  3 results) No results for input(s): PROBNP in the last 8760 hours.  Radiological Exams: No results found.  Assessment/Plan Active Problems:   Acute respiratory failure with hypoxia and hypercarbia (HCC)   COPD (chronic obstructive pulmonary disease) (HCC)   OSA (obstructive sleep apnea)   HCAP (healthcare-associated pneumonia)   Tracheostomy status ([place])   1. Acute on chronic respiratory failure with hypoxia patient is tolerating the T-bar fairly well.  We will continue with aggressive pulmonary toilet.  Secretions are fair to moderate.  Will titrate oxygen. 2. COPD severe disease continue with present management. 3. Healthcare associated pneumonia treated with antibiotics 4. Tracheostomy status will not be decannulated at this time   I have personally seen and evaluated the patient, evaluated laboratory and imaging results, formulated the assessment and plan and placed orders. The Patient requires high complexity decision making for assessment and support.  Case was discussed on Rounds with the Respiratory Therapy Staff  Allyne Gee, MD [facility] Pulmonary Critical Care Medicine Sleep Medicine

## 2018-06-18 LAB — POTASSIUM: Potassium: 5 mmol/L (ref 3.5–5.1)

## 2018-06-19 DIAGNOSIS — J438 Other emphysema: Secondary | ICD-10-CM | POA: Diagnosis not present

## 2018-06-19 DIAGNOSIS — Z930 Tracheostomy status: Secondary | ICD-10-CM | POA: Diagnosis not present

## 2018-06-19 DIAGNOSIS — J9602 Acute respiratory failure with hypercapnia: Secondary | ICD-10-CM | POA: Diagnosis not present

## 2018-06-19 DIAGNOSIS — J9601 Acute respiratory failure with hypoxia: Secondary | ICD-10-CM | POA: Diagnosis not present

## 2018-06-19 DIAGNOSIS — J189 Pneumonia, unspecified organism: Secondary | ICD-10-CM | POA: Diagnosis not present

## 2018-06-19 DIAGNOSIS — G4733 Obstructive sleep apnea (adult) (pediatric): Secondary | ICD-10-CM | POA: Diagnosis not present

## 2018-06-19 NOTE — Progress Notes (Signed)
Pulmonary [place]   PULMONARY SERVICE  PROGRESS NOTE  Date of Service: 06/19/2018  LOISE ESGUERRA  [id number]:[id number]  DOB: 1946/01/26   DOA: 05/27/2018  Referring Physician: Merton Border, MD  HPI: LAKERA VIALL is a 72 y.o. female seen for follow up of Acute on Chronic Respiratory Failure.  Patient is on room air at this time on T collar.  Has had a #4 tracheostomy in place.  Patient tolerating the PMV fairly well but is not vocalizing  Medications: Reviewed on Rounds  Physical Exam:  Vitals: Temperature 97.4 pulse 95 respiratory rate 17 blood pressure 134/72 saturation 94%  Ventilator Settings patient is on T collar right now room air  . General: Comfortable at this time . Eyes: Grossly normal lids, irises & conjunctiva . ENT: grossly tongue is normal . Neck: no obvious mass . Cardiovascular: S1 S2 normal no gallop . Respiratory: No rhonchi or rales . Abdomen: soft . Skin: no rash seen on limited exam . Musculoskeletal: not rigid . Psychiatric:unable to assess . Neurologic: no seizure no involuntary movements         Lab Data:   Basic Metabolic Panel: Recent Labs  Lab 06/15/18 0647 06/16/18 2112 06/17/18 0358 06/18/18 0617  NA 137  --   --   --   K 5.7* 5.1 6.0* 5.0  CL 95*  --   --   --   CO2 30  --   --   --   GLUCOSE 109*  --   --   --   BUN 34*  --   --   --   CREATININE 0.53  --   --   --   CALCIUM 9.5  --   --   --     Liver Function Tests: No results for input(s): AST, ALT, ALKPHOS, BILITOT, PROT, ALBUMIN in the last 168 hours. No results for input(s): LIPASE, AMYLASE in the last 168 hours. No results for input(s): AMMONIA in the last 168 hours.  CBC: Recent Labs  Lab 06/15/18 0756  WBC 11.2*  HGB 11.1*  HCT 36.4  MCV 92.4  PLT 204    Cardiac Enzymes: No results for input(s): CKTOTAL, CKMB, CKMBINDEX, TROPONINI in the last 168 hours.  BNP (last 3 results) No results for input(s): BNP in  the last 8760 hours.  ProBNP (last 3 results) No results for input(s): PROBNP in the last 8760 hours.  Radiological Exams: No results found.  Assessment/Plan Active Problems:   Acute respiratory failure with hypoxia and hypercarbia (HCC)   COPD (chronic obstructive pulmonary disease) (HCC)   OSA (obstructive sleep apnea)   HCAP (healthcare-associated pneumonia)   Tracheostomy status ([place])   1. Acute on chronic respiratory failure with hypoxia continue with T collar not able to decannulate at this time continue with supportive care titrate oxygen which she is off of right now 2. COPD severe disease we will continue to monitor 3. Sleep apnea at baseline 4. Healthcare associated pneumonia treated 5. Tracheostomy will need to remain in place at this time suspect some vocal cord dysfunction is playing a role   I have personally seen and evaluated the patient, evaluated laboratory and imaging results, formulated the assessment and plan and placed orders. The Patient requires high complexity decision making for assessment and support.  Case was discussed on Rounds with the Respiratory Therapy Staff  Allyne Gee, MD [facility] Pulmonary Critical Care Medicine Sleep Medicine

## 2018-06-20 DIAGNOSIS — G4733 Obstructive sleep apnea (adult) (pediatric): Secondary | ICD-10-CM | POA: Diagnosis not present

## 2018-06-20 DIAGNOSIS — J438 Other emphysema: Secondary | ICD-10-CM | POA: Diagnosis not present

## 2018-06-20 DIAGNOSIS — J9601 Acute respiratory failure with hypoxia: Secondary | ICD-10-CM | POA: Diagnosis not present

## 2018-06-20 DIAGNOSIS — J189 Pneumonia, unspecified organism: Secondary | ICD-10-CM | POA: Diagnosis not present

## 2018-06-20 NOTE — Progress Notes (Signed)
Pulmonary [place]   PULMONARY SERVICE  PROGRESS NOTE  Date of Service: 06/20/2018  Debbie Pena  [id number]  DOB: 01/24/1946   DOA: 05/27/2018  Referring Physician: Merton Border, MD  HPI: Debbie Pena is a 72 y.o. female seen for follow up of Acute on Chronic Respiratory Failure.  Patient is on T collar.  Comfortable without distress.  Patient right now is on room air  Medications: Reviewed on Rounds  Physical Exam:  Vitals: Temperature 95.9 pulse 95 respiratory 26 blood pressure 120/70 saturations 97%  Ventilator Settings currently off the ventilator on T collar  . General: Comfortable at this time . Eyes: Grossly normal lids, irises & conjunctiva . ENT: grossly tongue is normal . Neck: no obvious mass . Cardiovascular: S1 S2 normal no gallop . Respiratory: No rhonchi or rales . Abdomen: soft . Skin: no rash seen on limited exam . Musculoskeletal: not rigid . Psychiatric:unable to assess . Neurologic: no seizure no involuntary movements         Lab Data:   Basic Metabolic Panel: Recent Labs  Lab 06/15/18 0647 06/16/18 2112 06/17/18 0358 06/18/18 0617  NA 137  --   --   --   K 5.7* 5.1 6.0* 5.0  CL 95*  --   --   --   CO2 30  --   --   --   GLUCOSE 109*  --   --   --   BUN 34*  --   --   --   CREATININE 0.53  --   --   --   CALCIUM 9.5  --   --   --     Liver Function Tests: No results for input(s): AST, ALT, ALKPHOS, BILITOT, PROT, ALBUMIN in the last 168 hours. No results for input(s): LIPASE, AMYLASE in the last 168 hours. No results for input(s): AMMONIA in the last 168 hours.  CBC: Recent Labs  Lab 06/15/18 0756  WBC 11.2*  HGB 11.1*  HCT 36.4  MCV 92.4  PLT 204    Cardiac Enzymes: No results for input(s): CKTOTAL, CKMB, CKMBINDEX, TROPONINI in the last 168 hours.  BNP (last 3 results) No results for input(s): BNP in the last 8760 hours.  ProBNP (last 3 results) No results for  input(s): PROBNP in the last 8760 hours.  Radiological Exams: No results found.  Assessment/Plan Active Problems:   Acute respiratory failure with hypoxia and hypercarbia (HCC)   COPD (chronic obstructive pulmonary disease) (HCC)   OSA (obstructive sleep apnea)   HCAP (healthcare-associated pneumonia)   Tracheostomy status ([place])   1. Acute on chronic respiratory failure with hypoxia she is at baseline with the T collar in place not able to decannulate at this time.  Continue with pulmonary toilet supportive care secretions are minimal 2. COPD stable we will continue with present management. 3. Healthcare associated pneumonia treated we will continue to follow 4. Tracheostomy status will need to remain in place 5. Obstructive sleep apnea likely need for long-term trach   I have personally seen and evaluated the patient, evaluated laboratory and imaging results, formulated the assessment and plan and placed orders. The Patient requires high complexity decision making for assessment and support.  Case was discussed on Rounds with the Respiratory Therapy Staff  Allyne Gee, MD [facility] Pulmonary Critical Care Medicine Sleep Medicine

## 2018-06-21 ENCOUNTER — Other Ambulatory Visit (HOSPITAL_COMMUNITY): Payer: Self-pay

## 2018-06-21 DIAGNOSIS — J9601 Acute respiratory failure with hypoxia: Secondary | ICD-10-CM | POA: Diagnosis not present

## 2018-06-21 DIAGNOSIS — G4733 Obstructive sleep apnea (adult) (pediatric): Secondary | ICD-10-CM | POA: Diagnosis not present

## 2018-06-21 DIAGNOSIS — J189 Pneumonia, unspecified organism: Secondary | ICD-10-CM | POA: Diagnosis not present

## 2018-06-21 DIAGNOSIS — J438 Other emphysema: Secondary | ICD-10-CM | POA: Diagnosis not present

## 2018-06-21 LAB — BASIC METABOLIC PANEL
Anion gap: 10 (ref 5–15)
BUN: 29 mg/dL — ABNORMAL HIGH (ref 8–23)
CALCIUM: 9.6 mg/dL (ref 8.9–10.3)
CO2: 38 mmol/L — ABNORMAL HIGH (ref 22–32)
Chloride: 93 mmol/L — ABNORMAL LOW (ref 98–111)
Creatinine, Ser: 0.47 mg/dL (ref 0.44–1.00)
GFR calc Af Amer: >60 mL/min (ref >60)
Glucose, Bld: 140 mg/dL — ABNORMAL HIGH (ref 70–99)
POTASSIUM: 5.2 mmol/L — AB (ref 3.5–5.1)
SODIUM: 141 mmol/L (ref 135–145)

## 2018-06-21 NOTE — Progress Notes (Signed)
Pulmonary [place]   PULMONARY SERVICE  PROGRESS NOTE  Date of Service: 06/21/2018  BRENLEE KOSKELA  HFW:[id number]  DOB: 1946-03-11   DOA: 05/27/2018  Referring Physician: Merton Border, MD  HPI: Debbie Pena is a 72 y.o. female seen for follow up of Acute on Chronic Respiratory Failure.  She is doing well with the T collar.  Had a evaluation done by ENT and the vocal cords do appear to be okay.  The patient basically has a weak voice which we need to continue to monitor.  Right now is on 28% oxygen as it has a #4 tracheostomy in place.  Medications: Reviewed on Rounds  Physical Exam:  Vitals: Temperature 97.2 pulse 97 respiratory rate 22 blood pressure 130/70 saturations 100%  Ventilator Settings currently off the ventilator on T collar has been on 28% FiO2  . General: Comfortable at this time . Eyes: Grossly normal lids, irises & conjunctiva . ENT: grossly tongue is normal . Neck: no obvious mass . Cardiovascular: S1 S2 normal no gallop . Respiratory: No rhonchi or rales . Abdomen: soft . Skin: no rash seen on limited exam . Musculoskeletal: not rigid . Psychiatric:unable to assess . Neurologic: no seizure no involuntary movements         Lab Data:   Basic Metabolic Panel: Recent Labs  Lab 06/15/18 0647 06/16/18 2112 06/17/18 0358 06/18/18 0617 06/21/18 0723  NA 137  --   --   --  141  K 5.7* 5.1 6.0* 5.0 5.2*  CL 95*  --   --   --  93*  CO2 30  --   --   --  38*  GLUCOSE 109*  --   --   --  140*  BUN 34*  --   --   --  29*  CREATININE 0.53  --   --   --  0.47  CALCIUM 9.5  --   --   --  9.6    Liver Function Tests: No results for input(s): AST, ALT, ALKPHOS, BILITOT, PROT, ALBUMIN in the last 168 hours. No results for input(s): LIPASE, AMYLASE in the last 168 hours. No results for input(s): AMMONIA in the last 168 hours.  CBC: Recent Labs  Lab 06/15/18 0756  WBC 11.2*  HGB 11.1*  HCT 36.4  MCV 92.4   PLT 204    Cardiac Enzymes: No results for input(s): CKTOTAL, CKMB, CKMBINDEX, TROPONINI in the last 168 hours.  BNP (last 3 results) No results for input(s): BNP in the last 8760 hours.  ProBNP (last 3 results) No results for input(s): PROBNP in the last 8760 hours.  Radiological Exams: No results found.  Assessment/Plan Active Problems:   Acute respiratory failure with hypoxia and hypercarbia (HCC)   COPD (chronic obstructive pulmonary disease) (HCC)   OSA (obstructive sleep apnea)   HCAP (healthcare-associated pneumonia)   Tracheostomy status ([place])   1. Acute on chronic respiratory failure with hypoxia we will continue with T bar weans.  We will have respiratory reassess potential for capping she is failed previously.  We will continue monitoring 2. COPD severe disease continue present management. 3. Obstructive sleep apnea probable cause for not being able to We will continue supportive care 4. Healthcare associated pneumonia treated we will monitor 5. Tracheostomy status as above evaluated we will continue present management   I have personally seen and evaluated the patient, evaluated laboratory and imaging results, formulated the assessment and plan and placed  orders. The Patient requires high complexity decision making for assessment and support.  Case was discussed on Rounds with the Respiratory Therapy Staff  Allyne Gee, MD [facility] Pulmonary Critical Care Medicine Sleep Medicine

## 2018-06-22 DIAGNOSIS — J438 Other emphysema: Secondary | ICD-10-CM | POA: Diagnosis not present

## 2018-06-22 DIAGNOSIS — J189 Pneumonia, unspecified organism: Secondary | ICD-10-CM | POA: Diagnosis not present

## 2018-06-22 DIAGNOSIS — J9601 Acute respiratory failure with hypoxia: Secondary | ICD-10-CM | POA: Diagnosis not present

## 2018-06-22 DIAGNOSIS — G4733 Obstructive sleep apnea (adult) (pediatric): Secondary | ICD-10-CM | POA: Diagnosis not present

## 2018-06-22 NOTE — Progress Notes (Signed)
Pulmonary [place]   PULMONARY SERVICE  PROGRESS NOTE  Date of Service: 06/22/2018  Debbie Pena  UJW:[id number]  DOB: [birth year]   DOA: 05/27/2018  Referring Physician: Merton Border, MD  HPI: Debbie Pena is a 72 y.o. female seen for follow up of Acute on Chronic Respiratory Failure.  She is on T collar at baseline has been tolerating the PMV fairly well.  No distress noted.  She has not been able to be decannulated.  Medications: Reviewed on Rounds  Physical Exam:  Vitals: Temperature 97.4 pulse 85 respiratory rate 26 blood pressure 122/62 saturations 98%  Ventilator Settings on T collar 28% FiO2 with PMV  . General: Comfortable at this time . Eyes: Grossly normal lids, irises & conjunctiva . ENT: grossly tongue is normal . Neck: no obvious mass . Cardiovascular: S1 S2 normal no gallop . Respiratory: No rhonchi or rales . Abdomen: soft . Skin: no rash seen on limited exam . Musculoskeletal: not rigid . Psychiatric:unable to assess . Neurologic: no seizure no involuntary movements         Lab Data:   Basic Metabolic Panel: Recent Labs  Lab 06/16/18 2112 06/17/18 0358 06/18/18 0617 06/21/18 0723  NA  --   --   --  141  K 5.1 6.0* 5.0 5.2*  CL  --   --   --  93*  CO2  --   --   --  38*  GLUCOSE  --   --   --  140*  BUN  --   --   --  29*  CREATININE  --   --   --  0.47  CALCIUM  --   --   --  9.6    Liver Function Tests: No results for input(s): AST, ALT, ALKPHOS, BILITOT, PROT, ALBUMIN in the last 168 hours. No results for input(s): LIPASE, AMYLASE in the last 168 hours. No results for input(s): AMMONIA in the last 168 hours.  CBC: No results for input(s): WBC, NEUTROABS, HGB, HCT, MCV, PLT in the last 168 hours.  Cardiac Enzymes: No results for input(s): CKTOTAL, CKMB, CKMBINDEX, TROPONINI in the last 168 hours.  BNP (last 3 results) No results for input(s): BNP in the last 8760 hours.  ProBNP  (last 3 results) No results for input(s): PROBNP in the last 8760 hours.  Radiological Exams: No results found.  Assessment/Plan Active Problems:   Acute respiratory failure with hypoxia and hypercarbia (HCC)   COPD (chronic obstructive pulmonary disease) (HCC)   OSA (obstructive sleep apnea)   HCAP (healthcare-associated pneumonia)   Tracheostomy status ([place])   1. Acute on chronic respiratory failure with hypoxia tolerating T collar at baseline continue pulmonary toilet supportive care 2. COPD severe disease we will continue with present management control 3. Healthcare associated pneumonia treated 4. Tracheostomy will be continued 5. Obstructive sleep apnea nonissue   I have personally seen and evaluated the patient, evaluated laboratory and imaging results, formulated the assessment and plan and placed orders. The Patient requires high complexity decision making for assessment and support.  Case was discussed on Rounds with the Respiratory Therapy Staff  Allyne Gee, MD [facility] Pulmonary Critical Care Medicine Sleep Medicine

## 2018-06-23 DIAGNOSIS — G4733 Obstructive sleep apnea (adult) (pediatric): Secondary | ICD-10-CM | POA: Diagnosis not present

## 2018-06-23 DIAGNOSIS — J9602 Acute respiratory failure with hypercapnia: Secondary | ICD-10-CM | POA: Diagnosis not present

## 2018-06-23 DIAGNOSIS — J189 Pneumonia, unspecified organism: Secondary | ICD-10-CM | POA: Diagnosis not present

## 2018-06-23 DIAGNOSIS — J9601 Acute respiratory failure with hypoxia: Secondary | ICD-10-CM | POA: Diagnosis not present

## 2018-06-23 DIAGNOSIS — Z930 Tracheostomy status: Secondary | ICD-10-CM | POA: Diagnosis not present

## 2018-06-23 DIAGNOSIS — J438 Other emphysema: Secondary | ICD-10-CM | POA: Diagnosis not present

## 2018-06-23 NOTE — Progress Notes (Signed)
Pulmonary [place]   PULMONARY SERVICE  PROGRESS NOTE  Date of Service: 06/23/2018  Debbie Pena  [id number]:[id number]  DOB: 12/26/1945   DOA: 05/27/2018  Referring Physician: Merton Border, MD  HPI: Debbie Pena is a 72 y.o. female seen for follow up of Acute on Chronic Respiratory Failure.  She remains on T collar currently is on 28% oxygen comfortable without distress  Medications: Reviewed on Rounds  Physical Exam:  Vitals:  Temperature 97.6 degrees pulse 60 respiratory 20 blood pressure 124/62 saturations 100%  Ventilator Settings  Off the ventilator on T collar  . General: Comfortable at this time . Eyes: Grossly normal lids, irises & conjunctiva . ENT: grossly tongue is normal . Neck: no obvious mass . Cardiovascular: S1 S2 normal no gallop . Respiratory:  No rhonchi or rales are noted . Abdomen: soft . Skin: no rash seen on limited exam . Musculoskeletal: not rigid . Psychiatric:unable to assess . Neurologic: no seizure no involuntary movements         Lab Data:   Basic Metabolic Panel: Recent Labs  Lab 06/16/18 2112 06/17/18 0358 06/18/18 0617 06/21/18 0723  NA  --   --   --  141  K 5.1 6.0* 5.0 5.2*  CL  --   --   --  93*  CO2  --   --   --  38*  GLUCOSE  --   --   --  140*  BUN  --   --   --  29*  CREATININE  --   --   --  0.47  CALCIUM  --   --   --  9.6    Liver Function Tests: No results for input(s): AST, ALT, ALKPHOS, BILITOT, PROT, ALBUMIN in the last 168 hours. No results for input(s): LIPASE, AMYLASE in the last 168 hours. No results for input(s): AMMONIA in the last 168 hours.  CBC: No results for input(s): WBC, NEUTROABS, HGB, HCT, MCV, PLT in the last 168 hours.  Cardiac Enzymes: No results for input(s): CKTOTAL, CKMB, CKMBINDEX, TROPONINI in the last 168 hours.  BNP (last 3 results) No results for input(s): BNP in the last 8760 hours.  ProBNP (last 3 results) No results for  input(s): PROBNP in the last 8760 hours.  Radiological Exams: No results found.  Assessment/Plan Active Problems:   Acute respiratory failure with hypoxia and hypercarbia (HCC)   COPD (chronic obstructive pulmonary disease) (HCC)   OSA (obstructive sleep apnea)   HCAP (healthcare-associated pneumonia)   Tracheostomy status (HCC)   1.  acute on chronic Respiratory failure hypoxia will continue with T collar trials she is at baseline continue pulmonary toilet supportive care.   2. COPD severe disease continue present management 3. Structures sleep apnea tracheostomy is in place 4. Tracheostomy status not able to decannulate 5. Healthcare associated pneumonia treated Will continue to monitor   I have personally seen and evaluated the patient, evaluated laboratory and imaging results, formulated the assessment and plan and placed orders. The Patient requires high complexity decision making for assessment and support.  Case was discussed on Rounds with the Respiratory Therapy Staff  Allyne Gee, MD [facility] Pulmonary Critical Care Medicine Sleep Medicine

## 2018-06-24 DIAGNOSIS — J189 Pneumonia, unspecified organism: Secondary | ICD-10-CM | POA: Diagnosis not present

## 2018-06-24 DIAGNOSIS — J9601 Acute respiratory failure with hypoxia: Secondary | ICD-10-CM | POA: Diagnosis not present

## 2018-06-24 DIAGNOSIS — G4733 Obstructive sleep apnea (adult) (pediatric): Secondary | ICD-10-CM | POA: Diagnosis not present

## 2018-06-24 DIAGNOSIS — J438 Other emphysema: Secondary | ICD-10-CM | POA: Diagnosis not present

## 2018-06-24 NOTE — Progress Notes (Signed)
Pulmonary [place]   PULMONARY SERVICE  PROGRESS NOTE  Date of Service: 06/24/2018  Debbie Pena  QMG:[id number]  DOB: October 03, 1946   DOA: 05/27/2018  Referring Physician: Merton Border, MD  HPI: Debbie Pena is a 72 y.o. female seen for follow up of Acute on Chronic Respiratory Failure.  Patient is on 20% oxygen with a #4 trach in place she is doing fine secretions are minimal  Medications: Reviewed on Rounds  Physical Exam:  Vitals: Temperature 97.7 pulse 78 respiratory rate 22 blood pressure 130/70 saturations 99%  Ventilator Settings on T collar trials  . General: Comfortable at this time . Eyes: Grossly normal lids, irises & conjunctiva . ENT: grossly tongue is normal . Neck: no obvious mass . Cardiovascular: S1 S2 normal no gallop . Respiratory: No rhonchi rales . Abdomen: soft . Skin: no rash seen on limited exam . Musculoskeletal: not rigid . Psychiatric:unable to assess . Neurologic: no seizure no involuntary movements         Lab Data:   Basic Metabolic Panel: Recent Labs  Lab 06/18/18 0617 06/21/18 0723  NA  --  141  K 5.0 5.2*  CL  --  93*  CO2  --  38*  GLUCOSE  --  140*  BUN  --  29*  CREATININE  --  0.47  CALCIUM  --  9.6    Liver Function Tests: No results for input(s): AST, ALT, ALKPHOS, BILITOT, PROT, ALBUMIN in the last 168 hours. No results for input(s): LIPASE, AMYLASE in the last 168 hours. No results for input(s): AMMONIA in the last 168 hours.  CBC: No results for input(s): WBC, NEUTROABS, HGB, HCT, MCV, PLT in the last 168 hours.  Cardiac Enzymes: No results for input(s): CKTOTAL, CKMB, CKMBINDEX, TROPONINI in the last 168 hours.  BNP (last 3 results) No results for input(s): BNP in the last 8760 hours.  ProBNP (last 3 results) No results for input(s): PROBNP in the last 8760 hours.  Radiological Exams: No results found.  Assessment/Plan Active Problems:   Acute  respiratory failure with hypoxia and hypercarbia (HCC)   COPD (chronic obstructive pulmonary disease) (HCC)   OSA (obstructive sleep apnea)   HCAP (healthcare-associated pneumonia)   Tracheostomy status ([place])   1. Acute on chronic respiratory failure with hypoxia patient continues on room air we will continue pulmonary toilet secretion management supportive care. 2. COPD severe disease we will continue to follow 3. OSA with tracheostomy in place at baseline 4. Healthcare associated pneumonia treated we will monitor 5. Tracheostomy continue pulmonary toilet   I have personally seen and evaluated the patient, evaluated laboratory and imaging results, formulated the assessment and plan and placed orders. The Patient requires high complexity decision making for assessment and support.  Case was discussed on Rounds with the Respiratory Therapy Staff  Allyne Gee, MD [facility] Pulmonary Critical Care Medicine Sleep Medicine

## 2018-06-25 DIAGNOSIS — G4733 Obstructive sleep apnea (adult) (pediatric): Secondary | ICD-10-CM | POA: Diagnosis not present

## 2018-06-25 DIAGNOSIS — J189 Pneumonia, unspecified organism: Secondary | ICD-10-CM | POA: Diagnosis not present

## 2018-06-25 DIAGNOSIS — J438 Other emphysema: Secondary | ICD-10-CM | POA: Diagnosis not present

## 2018-06-25 DIAGNOSIS — J9601 Acute respiratory failure with hypoxia: Secondary | ICD-10-CM | POA: Diagnosis not present

## 2018-06-25 LAB — BASIC METABOLIC PANEL
ANION GAP: 15 (ref 5–15)
BUN: 22 mg/dL (ref 8–23)
CALCIUM: 9.3 mg/dL (ref 8.9–10.3)
CO2: 34 mmol/L — AB (ref 22–32)
Chloride: 93 mmol/L — ABNORMAL LOW (ref 98–111)
Creatinine, Ser: 0.44 mg/dL (ref 0.44–1.00)
GFR calc non Af Amer: >60 mL/min (ref >60)
Glucose, Bld: 130 mg/dL — ABNORMAL HIGH (ref 70–99)
Potassium: 4.4 mmol/L (ref 3.5–5.1)
Sodium: 142 mmol/L (ref 135–145)

## 2018-06-25 LAB — CBC
HCT: 43.1 % (ref 36.0–46.0)
HEMOGLOBIN: 12.7 g/dL (ref 12.0–15.0)
MCH: 28.2 pg (ref 26.0–34.0)
MCHC: 29.5 g/dL — AB (ref 30.0–36.0)
MCV: 95.6 fL (ref 78.0–100.0)
Platelets: 191 10*3/uL (ref 150–400)
RBC: 4.51 MIL/uL (ref 3.87–5.11)
RDW: 14.5 % (ref 11.5–15.5)
WBC: 12.7 10*3/uL — ABNORMAL HIGH (ref 4.0–10.5)

## 2018-06-25 NOTE — Progress Notes (Signed)
Pulmonary [place]   PULMONARY SERVICE  PROGRESS NOTE  Date of Service: 06/25/2018  Debbie Pena  [id number]  DOB: 13-Jun-1946   DOA: 05/27/2018  Referring Physician: Merton Border, MD  HPI: Debbie Pena is a 72 y.o. female seen for follow up of Acute on Chronic Respiratory Failure.  Patient is comfortable without distress at this time.  She remains on T collar  Medications: Reviewed on Rounds  Physical Exam:  Vitals: Temperature 97.6 pulse 115 respiratory rate 28 blood pressure 143/79 saturations 95%  Ventilator Settings off the ventilator on T collar  . General: Comfortable at this time . Eyes: Grossly normal lids, irises & conjunctiva . ENT: grossly tongue is normal . Neck: no obvious mass . Cardiovascular: S1 S2 normal no gallop . Respiratory: No rhonchi . Abdomen: soft . Skin: no rash seen on limited exam . Musculoskeletal: not rigid . Psychiatric:unable to assess . Neurologic: no seizure no involuntary movements         Lab Data:   Basic Metabolic Panel: Recent Labs  Lab 06/21/18 0723 06/25/18 0657  NA 141 142  K 5.2* 4.4  CL 93* 93*  CO2 38* 34*  GLUCOSE 140* 130*  BUN 29* 22  CREATININE 0.47 0.44  CALCIUM 9.6 9.3    Liver Function Tests: No results for input(s): AST, ALT, ALKPHOS, BILITOT, PROT, ALBUMIN in the last 168 hours. No results for input(s): LIPASE, AMYLASE in the last 168 hours. No results for input(s): AMMONIA in the last 168 hours.  CBC: Recent Labs  Lab 06/25/18 0657  WBC 12.7*  HGB 12.7  HCT 43.1  MCV 95.6  PLT 191    Cardiac Enzymes: No results for input(s): CKTOTAL, CKMB, CKMBINDEX, TROPONINI in the last 168 hours.  BNP (last 3 results) No results for input(s): BNP in the last 8760 hours.  ProBNP (last 3 results) No results for input(s): PROBNP in the last 8760 hours.  Radiological Exams: No results found.  Assessment/Plan Active Problems:   Acute respiratory  failure with hypoxia and hypercarbia (HCC)   COPD (chronic obstructive pulmonary disease) (HCC)   OSA (obstructive sleep apnea)   HCAP (healthcare-associated pneumonia)   Tracheostomy status ([place])   1. Acute on chronic respiratory failure with hypoxia doing well on T collar at baseline 2. Chronic obstructive pulmonary disease stable we will monitor 3. Healthcare associated pneumonia treated 4. Sleep apnea no distress with the trach 5. Tracheostomy status at baseline   I have personally seen and evaluated the patient, evaluated laboratory and imaging results, formulated the assessment and plan and placed orders. The Patient requires high complexity decision making for assessment and support.  Case was discussed on Rounds with the Respiratory Therapy Staff  Allyne Gee, MD [facility] Pulmonary Critical Care Medicine Sleep Medicine

## 2018-06-26 DIAGNOSIS — J438 Other emphysema: Secondary | ICD-10-CM | POA: Diagnosis not present

## 2018-06-26 DIAGNOSIS — J9601 Acute respiratory failure with hypoxia: Secondary | ICD-10-CM | POA: Diagnosis not present

## 2018-06-26 DIAGNOSIS — G4733 Obstructive sleep apnea (adult) (pediatric): Secondary | ICD-10-CM | POA: Diagnosis not present

## 2018-06-26 DIAGNOSIS — J189 Pneumonia, unspecified organism: Secondary | ICD-10-CM | POA: Diagnosis not present

## 2018-06-26 NOTE — Progress Notes (Signed)
Pulmonary [place]   PULMONARY SERVICE  PROGRESS NOTE  Date of Service: 06/26/2018  MIMI DEBELLIS  [id number]:[id number]  DOB: 1946/11/18   DOA: 05/27/2018  Referring Physician: Merton Border, MD  HPI: Debbie ESQUILIN is a 72 y.o. female seen for follow up of Acute on Chronic Respiratory Failure.  Patient is on T collar on room air she is doing fine not able to be decannulated  Medications: Reviewed on Rounds  Physical Exam:  Vitals: Temperature 98.0 pulse 94 respiratory 22 blood pressure 145/60 saturations 98%  Ventilator Settings off the ventilator on T collar  . General: Comfortable at this time . Eyes: Grossly normal lids, irises & conjunctiva . ENT: grossly tongue is normal . Neck: no obvious mass . Cardiovascular: S1 S2 normal no gallop . Respiratory: No rhonchi expansion equal . Abdomen: soft . Skin: no rash seen on limited exam . Musculoskeletal: not rigid . Psychiatric:unable to assess . Neurologic: no seizure no involuntary movements         Lab Data:   Basic Metabolic Panel: Recent Labs  Lab 06/21/18 0723 06/25/18 0657  NA 141 142  K 5.2* 4.4  CL 93* 93*  CO2 38* 34*  GLUCOSE 140* 130*  BUN 29* 22  CREATININE 0.47 0.44  CALCIUM 9.6 9.3    Liver Function Tests: No results for input(s): AST, ALT, ALKPHOS, BILITOT, PROT, ALBUMIN in the last 168 hours. No results for input(s): LIPASE, AMYLASE in the last 168 hours. No results for input(s): AMMONIA in the last 168 hours.  CBC: Recent Labs  Lab 06/25/18 0657  WBC 12.7*  HGB 12.7  HCT 43.1  MCV 95.6  PLT 191    Cardiac Enzymes: No results for input(s): CKTOTAL, CKMB, CKMBINDEX, TROPONINI in the last 168 hours.  BNP (last 3 results) No results for input(s): BNP in the last 8760 hours.  ProBNP (last 3 results) No results for input(s): PROBNP in the last 8760 hours.  Radiological Exams: No results found.  Assessment/Plan Active Problems:   Acute  respiratory failure with hypoxia and hypercarbia (HCC)   COPD (chronic obstructive pulmonary disease) (HCC)   OSA (obstructive sleep apnea)   HCAP (healthcare-associated pneumonia)   Tracheostomy status ([place])   1. Acute on chronic respiratory failure with hypoxia we will continue with T collar trials continue pulmonary toilet supportive care she is at baseline awaiting discharge planning 2. COPD severe disease continue present management 3. Healthcare associated pneumonia treated resolved 4. Tracheostomy will remain in place 5. Sleep apnea resolved secondary to trach   I have personally seen and evaluated the patient, evaluated laboratory and imaging results, formulated the assessment and plan and placed orders. The Patient requires high complexity decision making for assessment and support.  Case was discussed on Rounds with the Respiratory Therapy Staff  Allyne Gee, MD [facility] Pulmonary Critical Care Medicine Sleep Medicine

## 2018-06-27 DIAGNOSIS — G4733 Obstructive sleep apnea (adult) (pediatric): Secondary | ICD-10-CM | POA: Diagnosis not present

## 2018-06-27 DIAGNOSIS — J189 Pneumonia, unspecified organism: Secondary | ICD-10-CM | POA: Diagnosis not present

## 2018-06-27 DIAGNOSIS — J9601 Acute respiratory failure with hypoxia: Secondary | ICD-10-CM | POA: Diagnosis not present

## 2018-06-27 DIAGNOSIS — J438 Other emphysema: Secondary | ICD-10-CM | POA: Diagnosis not present

## 2018-06-27 DIAGNOSIS — J9602 Acute respiratory failure with hypercapnia: Secondary | ICD-10-CM | POA: Diagnosis not present

## 2018-06-27 DIAGNOSIS — Z930 Tracheostomy status: Secondary | ICD-10-CM | POA: Diagnosis not present

## 2018-06-27 NOTE — Progress Notes (Signed)
Pulmonary [place]   PULMONARY SERVICE  PROGRESS NOTE  Date of Service: 06/27/2018  Debbie Pena  [id number]:[id number]  DOB: Jun 26, 1946   DOA: 05/27/2018  Referring Physician: Merton Border, MD  HPI: Debbie Pena is a 72 y.o. female seen for follow up of Acute on Chronic Respiratory Failure.  Patient is on T collar she is scheduled for discharge comfortable no distress  Medications: Reviewed on Rounds  Physical Exam:  Vitals: Temperature 97.7 pulse 80 respiratory rate 23 blood pressure 138/78 saturations 98%  Ventilator Settings off the ventilator on T collar  . General: Comfortable at this time . Eyes: Grossly normal lids, irises & conjunctiva . ENT: grossly tongue is normal . Neck: no obvious mass . Cardiovascular: S1 S2 normal no gallop . Respiratory: No rhonchi or rales . Abdomen: soft . Skin: no rash seen on limited exam . Musculoskeletal: not rigid . Psychiatric:unable to assess . Neurologic: no seizure no involuntary movements         Lab Data:   Basic Metabolic Panel: Recent Labs  Lab 06/21/18 0723 06/25/18 0657  NA 141 142  K 5.2* 4.4  CL 93* 93*  CO2 38* 34*  GLUCOSE 140* 130*  BUN 29* 22  CREATININE 0.47 0.44  CALCIUM 9.6 9.3    Liver Function Tests: No results for input(s): AST, ALT, ALKPHOS, BILITOT, PROT, ALBUMIN in the last 168 hours. No results for input(s): LIPASE, AMYLASE in the last 168 hours. No results for input(s): AMMONIA in the last 168 hours.  CBC: Recent Labs  Lab 06/25/18 0657  WBC 12.7*  HGB 12.7  HCT 43.1  MCV 95.6  PLT 191    Cardiac Enzymes: No results for input(s): CKTOTAL, CKMB, CKMBINDEX, TROPONINI in the last 168 hours.  BNP (last 3 results) No results for input(s): BNP in the last 8760 hours.  ProBNP (last 3 results) No results for input(s): PROBNP in the last 8760 hours.  Radiological Exams: No results found.  Assessment/Plan Active Problems:   Acute  respiratory failure with hypoxia and hypercarbia (HCC)   COPD (chronic obstructive pulmonary disease) (HCC)   OSA (obstructive sleep apnea)   HCAP (healthcare-associated pneumonia)   Tracheostomy status ([place])   1. Acute on chronic respiratory failure with hypoxia resolved patient is at baseline T collar 2. COPD severe disease continue with present management 3. Healthcare associated pneumonia treated 4. Tracheostomy tube remain in place   I have personally seen and evaluated the patient, evaluated laboratory and imaging results, formulated the assessment and plan and placed orders. The Patient requires high complexity decision making for assessment and support.  Case was discussed on Rounds with the Respiratory Therapy Staff  Allyne Gee, MD [facility] Pulmonary Critical Care Medicine Sleep Medicine

## 2018-07-07 DEATH — deceased
